# Patient Record
Sex: Male | Born: 1945 | Race: White | Hispanic: No | Marital: Married | Smoking: Former smoker
Health system: Southern US, Community
[De-identification: ages and names within clinical notes are randomized; demographics above are authoritative.]

## PROBLEM LIST (undated history)

## (undated) DIAGNOSIS — R140 Abdominal distension (gaseous): Secondary | ICD-10-CM

## (undated) DIAGNOSIS — N189 Chronic kidney disease, unspecified: Secondary | ICD-10-CM

## (undated) HISTORY — PX: COSMETIC SURGERY: SHX468

## (undated) HISTORY — PX: DENTAL SURGERY: SHX609

---

## 2002-05-03 ENCOUNTER — Ambulatory Visit (HOSPITAL_COMMUNITY): Admission: RE | Admit: 2002-05-03 | Discharge: 2002-05-03 | Payer: Self-pay | Admitting: Gastroenterology

## 2003-06-16 ENCOUNTER — Encounter: Admission: RE | Admit: 2003-06-16 | Discharge: 2003-06-16 | Payer: Self-pay | Admitting: Internal Medicine

## 2011-08-19 ENCOUNTER — Other Ambulatory Visit: Payer: Self-pay | Admitting: Internal Medicine

## 2011-08-19 DIAGNOSIS — K219 Gastro-esophageal reflux disease without esophagitis: Secondary | ICD-10-CM

## 2011-08-22 ENCOUNTER — Ambulatory Visit
Admission: RE | Admit: 2011-08-22 | Discharge: 2011-08-22 | Disposition: A | Discharge status: Home / Self Care | Payer: Medicare Other | Source: Ambulatory Visit | Attending: Internal Medicine | Admitting: Internal Medicine

## 2011-08-22 DIAGNOSIS — K219 Gastro-esophageal reflux disease without esophagitis: Secondary | ICD-10-CM

## 2012-02-12 DIAGNOSIS — H02409 Unspecified ptosis of unspecified eyelid: Secondary | ICD-10-CM | POA: Insufficient documentation

## 2012-02-12 DIAGNOSIS — H04123 Dry eye syndrome of bilateral lacrimal glands: Secondary | ICD-10-CM | POA: Insufficient documentation

## 2012-02-12 DIAGNOSIS — H01006 Unspecified blepharitis left eye, unspecified eyelid: Secondary | ICD-10-CM | POA: Insufficient documentation

## 2015-03-28 DIAGNOSIS — D696 Thrombocytopenia, unspecified: Secondary | ICD-10-CM | POA: Diagnosis not present

## 2015-03-28 DIAGNOSIS — Z1389 Encounter for screening for other disorder: Secondary | ICD-10-CM | POA: Diagnosis not present

## 2015-03-28 DIAGNOSIS — N400 Enlarged prostate without lower urinary tract symptoms: Secondary | ICD-10-CM | POA: Diagnosis not present

## 2015-03-28 DIAGNOSIS — R7309 Other abnormal glucose: Secondary | ICD-10-CM | POA: Diagnosis not present

## 2015-03-28 DIAGNOSIS — Z13220 Encounter for screening for lipoid disorders: Secondary | ICD-10-CM | POA: Diagnosis not present

## 2015-03-28 DIAGNOSIS — J309 Allergic rhinitis, unspecified: Secondary | ICD-10-CM | POA: Diagnosis not present

## 2015-03-28 DIAGNOSIS — Z136 Encounter for screening for cardiovascular disorders: Secondary | ICD-10-CM | POA: Diagnosis not present

## 2015-03-28 DIAGNOSIS — Z0000 Encounter for general adult medical examination without abnormal findings: Secondary | ICD-10-CM | POA: Diagnosis not present

## 2015-06-05 DIAGNOSIS — H1010 Acute atopic conjunctivitis, unspecified eye: Secondary | ICD-10-CM | POA: Diagnosis not present

## 2015-06-05 DIAGNOSIS — J019 Acute sinusitis, unspecified: Secondary | ICD-10-CM | POA: Diagnosis not present

## 2016-02-05 DIAGNOSIS — R140 Abdominal distension (gaseous): Secondary | ICD-10-CM

## 2016-02-05 HISTORY — DX: Abdominal distension (gaseous): R14.0

## 2016-02-25 DIAGNOSIS — R808 Other proteinuria: Secondary | ICD-10-CM | POA: Diagnosis not present

## 2016-02-25 DIAGNOSIS — R1907 Generalized intra-abdominal and pelvic swelling, mass and lump: Secondary | ICD-10-CM | POA: Diagnosis not present

## 2016-02-25 DIAGNOSIS — R198 Other specified symptoms and signs involving the digestive system and abdomen: Secondary | ICD-10-CM | POA: Diagnosis not present

## 2016-02-26 ENCOUNTER — Encounter (HOSPITAL_COMMUNITY): Payer: Self-pay | Admitting: *Deleted

## 2016-02-26 ENCOUNTER — Emergency Department (HOSPITAL_COMMUNITY): Payer: PPO

## 2016-02-26 ENCOUNTER — Inpatient Hospital Stay (HOSPITAL_COMMUNITY)
Admission: EM | Admit: 2016-02-26 | Discharge: 2016-03-01 | DRG: 436 | Disposition: A | Discharge status: Home / Self Care | Payer: PPO | Attending: Internal Medicine | Admitting: Internal Medicine

## 2016-02-26 ENCOUNTER — Inpatient Hospital Stay (HOSPITAL_COMMUNITY): Payer: PPO

## 2016-02-26 DIAGNOSIS — C228 Malignant neoplasm of liver, primary, unspecified as to type: Secondary | ICD-10-CM | POA: Diagnosis not present

## 2016-02-26 DIAGNOSIS — R300 Dysuria: Secondary | ICD-10-CM | POA: Diagnosis present

## 2016-02-26 DIAGNOSIS — R740 Nonspecific elevation of levels of transaminase and lactic acid dehydrogenase [LDH]: Secondary | ICD-10-CM | POA: Diagnosis not present

## 2016-02-26 DIAGNOSIS — K449 Diaphragmatic hernia without obstruction or gangrene: Secondary | ICD-10-CM | POA: Diagnosis not present

## 2016-02-26 DIAGNOSIS — K839 Disease of biliary tract, unspecified: Secondary | ICD-10-CM

## 2016-02-26 DIAGNOSIS — K831 Obstruction of bile duct: Secondary | ICD-10-CM | POA: Diagnosis not present

## 2016-02-26 DIAGNOSIS — K8040 Calculus of bile duct with cholecystitis, unspecified, without obstruction: Secondary | ICD-10-CM | POA: Diagnosis not present

## 2016-02-26 DIAGNOSIS — R188 Other ascites: Secondary | ICD-10-CM

## 2016-02-26 DIAGNOSIS — C786 Secondary malignant neoplasm of retroperitoneum and peritoneum: Secondary | ICD-10-CM | POA: Diagnosis not present

## 2016-02-26 DIAGNOSIS — K759 Inflammatory liver disease, unspecified: Secondary | ICD-10-CM | POA: Diagnosis present

## 2016-02-26 DIAGNOSIS — R109 Unspecified abdominal pain: Secondary | ICD-10-CM | POA: Diagnosis present

## 2016-02-26 DIAGNOSIS — R7401 Elevation of levels of liver transaminase levels: Secondary | ICD-10-CM | POA: Diagnosis present

## 2016-02-26 DIAGNOSIS — J9811 Atelectasis: Secondary | ICD-10-CM | POA: Diagnosis not present

## 2016-02-26 DIAGNOSIS — C221 Intrahepatic bile duct carcinoma: Secondary | ICD-10-CM | POA: Diagnosis not present

## 2016-02-26 DIAGNOSIS — R180 Malignant ascites: Secondary | ICD-10-CM | POA: Diagnosis not present

## 2016-02-26 DIAGNOSIS — C180 Malignant neoplasm of cecum: Secondary | ICD-10-CM | POA: Diagnosis not present

## 2016-02-26 DIAGNOSIS — R935 Abnormal findings on diagnostic imaging of other abdominal regions, including retroperitoneum: Secondary | ICD-10-CM | POA: Diagnosis not present

## 2016-02-26 DIAGNOSIS — C801 Malignant (primary) neoplasm, unspecified: Secondary | ICD-10-CM | POA: Diagnosis not present

## 2016-02-26 DIAGNOSIS — R1084 Generalized abdominal pain: Secondary | ICD-10-CM

## 2016-02-26 DIAGNOSIS — C482 Malignant neoplasm of peritoneum, unspecified: Secondary | ICD-10-CM | POA: Diagnosis not present

## 2016-02-26 DIAGNOSIS — R1011 Right upper quadrant pain: Secondary | ICD-10-CM | POA: Diagnosis not present

## 2016-02-26 DIAGNOSIS — R8569 Abnormal cytological findings in specimens from other digestive organs and abdominal cavity: Secondary | ICD-10-CM | POA: Diagnosis not present

## 2016-02-26 DIAGNOSIS — Z87891 Personal history of nicotine dependence: Secondary | ICD-10-CM | POA: Diagnosis not present

## 2016-02-26 DIAGNOSIS — R140 Abdominal distension (gaseous): Secondary | ICD-10-CM

## 2016-02-26 DIAGNOSIS — K8050 Calculus of bile duct without cholangitis or cholecystitis without obstruction: Secondary | ICD-10-CM

## 2016-02-26 DIAGNOSIS — R933 Abnormal findings on diagnostic imaging of other parts of digestive tract: Secondary | ICD-10-CM | POA: Diagnosis not present

## 2016-02-26 DIAGNOSIS — K5900 Constipation, unspecified: Secondary | ICD-10-CM | POA: Diagnosis present

## 2016-02-26 DIAGNOSIS — C240 Malignant neoplasm of extrahepatic bile duct: Secondary | ICD-10-CM | POA: Diagnosis present

## 2016-02-26 HISTORY — DX: Abdominal distension (gaseous): R14.0

## 2016-02-26 LAB — CBC WITH DIFFERENTIAL/PLATELET
BASOS PCT: 0 %
Basophils Absolute: 0 10*3/uL (ref 0.0–0.1)
EOS ABS: 0.1 10*3/uL (ref 0.0–0.7)
EOS PCT: 2 %
HCT: 41.6 % (ref 39.0–52.0)
HEMOGLOBIN: 14.2 g/dL (ref 13.0–17.0)
Lymphocytes Relative: 25 %
Lymphs Abs: 1.7 10*3/uL (ref 0.7–4.0)
MCH: 34.2 pg — ABNORMAL HIGH (ref 26.0–34.0)
MCHC: 34.1 g/dL (ref 30.0–36.0)
MCV: 100.2 fL — ABNORMAL HIGH (ref 78.0–100.0)
Monocytes Absolute: 0.8 10*3/uL (ref 0.1–1.0)
Monocytes Relative: 13 %
NEUTROS PCT: 60 %
Neutro Abs: 4 10*3/uL (ref 1.7–7.7)
PLATELETS: 242 10*3/uL (ref 150–400)
RBC: 4.15 MIL/uL — AB (ref 4.22–5.81)
RDW: 14.1 % (ref 11.5–15.5)
WBC: 6.7 10*3/uL (ref 4.0–10.5)

## 2016-02-26 LAB — COMPREHENSIVE METABOLIC PANEL
ALBUMIN: 3 g/dL — AB (ref 3.5–5.0)
ALK PHOS: 327 U/L — AB (ref 38–126)
ALT: 184 U/L — AB (ref 17–63)
ANION GAP: 9 (ref 5–15)
AST: 139 U/L — ABNORMAL HIGH (ref 15–41)
BUN: 11 mg/dL (ref 6–20)
CALCIUM: 8.5 mg/dL — AB (ref 8.9–10.3)
CO2: 27 mmol/L (ref 22–32)
CREATININE: 0.99 mg/dL (ref 0.61–1.24)
Chloride: 99 mmol/L — ABNORMAL LOW (ref 101–111)
GFR calc non Af Amer: >60 mL/min (ref >60)
GLUCOSE: 109 mg/dL — AB (ref 65–99)
Potassium: 4.1 mmol/L (ref 3.5–5.1)
SODIUM: 135 mmol/L (ref 135–145)
Total Bilirubin: 1.9 mg/dL — ABNORMAL HIGH (ref 0.3–1.2)
Total Protein: 6.5 g/dL (ref 6.5–8.1)

## 2016-02-26 LAB — URINALYSIS, ROUTINE W REFLEX MICROSCOPIC
BILIRUBIN URINE: NEGATIVE
Glucose, UA: NEGATIVE mg/dL
Hgb urine dipstick: NEGATIVE
KETONES UR: NEGATIVE mg/dL
Leukocytes, UA: NEGATIVE
NITRITE: NEGATIVE
PH: 5 (ref 5.0–8.0)
Protein, ur: NEGATIVE mg/dL
Specific Gravity, Urine: 1.021 (ref 1.005–1.030)

## 2016-02-26 LAB — LIPASE, BLOOD: Lipase: 21 U/L (ref 11–51)

## 2016-02-26 LAB — PROTIME-INR
INR: 1.11
PROTHROMBIN TIME: 14.3 s (ref 11.4–15.2)

## 2016-02-26 LAB — APTT: aPTT: 29 seconds (ref 24–36)

## 2016-02-26 MED ORDER — ONDANSETRON HCL 4 MG/2ML IJ SOLN
4.0000 mg | Freq: Four times a day (QID) | INTRAMUSCULAR | Status: DC | PRN
  Administered 2016-02-27: 4 mg via INTRAVENOUS
  Filled 2016-02-26: qty 2

## 2016-02-26 MED ORDER — IOPAMIDOL (ISOVUE-300) INJECTION 61%
INTRAVENOUS | Status: AC
  Administered 2016-02-26: 100 mL
  Filled 2016-02-26: qty 100

## 2016-02-26 MED ORDER — PIPERACILLIN-TAZOBACTAM 3.375 G IVPB 30 MIN
3.3750 g | Freq: Once | INTRAVENOUS | Status: AC
  Administered 2016-02-27: 3.375 g via INTRAVENOUS
  Filled 2016-02-26: qty 50

## 2016-02-26 MED ORDER — PIPERACILLIN-TAZOBACTAM 3.375 G IVPB
3.3750 g | Freq: Three times a day (TID) | INTRAVENOUS | Status: DC
  Administered 2016-02-27 – 2016-02-29 (×7): 3.375 g via INTRAVENOUS
  Filled 2016-02-26 (×8): qty 50

## 2016-02-26 MED ORDER — OXYCODONE HCL 5 MG PO TABS: 5.0000 mg | ORAL_TABLET | Freq: Four times a day (QID) | ORAL | 0 refills | Status: DC | PRN

## 2016-02-26 MED ORDER — ONDANSETRON HCL 4 MG PO TABS: 4.0000 mg | ORAL_TABLET | Freq: Four times a day (QID) | ORAL | Status: DC | PRN

## 2016-02-26 MED ORDER — FENTANYL CITRATE (PF) 100 MCG/2ML IJ SOLN
50.0000 ug | Freq: Once | INTRAMUSCULAR | Status: AC
  Administered 2016-02-26: 50 ug via INTRAVENOUS
  Filled 2016-02-26: qty 2

## 2016-02-26 MED ORDER — ENOXAPARIN SODIUM 40 MG/0.4ML ~~LOC~~ SOLN
40.0000 mg | Freq: Every day | SUBCUTANEOUS | Status: DC
  Administered 2016-02-27: 40 mg via SUBCUTANEOUS
  Filled 2016-02-26 (×3): qty 0.4

## 2016-02-26 MED ORDER — FENTANYL CITRATE (PF) 100 MCG/2ML IJ SOLN
25.0000 ug | INTRAMUSCULAR | Status: DC | PRN
Start: 2016-02-26 — End: 2016-03-01
  Administered 2016-02-27 – 2016-03-01 (×6): 50 ug via INTRAVENOUS
  Filled 2016-02-26 (×7): qty 2

## 2016-02-26 NOTE — ED Notes (Signed)
Patient didn't want labs drawn states they were drawn yest.

## 2016-02-26 NOTE — ED Provider Notes (Signed)
[facility] DEPT Provider Note   CSN: [id number] Arrival date & time: 02/26/16  [id number]     History   Chief Complaint Chief Complaint  Patient presents with  . Abdominal Pain    HPI Matthew Waller is a 71 y.o. male.  The history is provided by the patient and the spouse.  Abdominal Pain   This is a new problem. Episode onset: 2 weeks ago. The problem occurs constantly. The problem has been gradually worsening. The pain is associated with an unknown factor. The pain is located in the generalized abdominal region and RUQ. The quality of the pain is aching. The pain is moderate. Associated symptoms include diarrhea (for 5 days which has since resolved). Pertinent negatives include fever and nausea. The symptoms are aggravated by certain positions. Nothing relieves the symptoms.    History reviewed. No pertinent past medical history.  There are no active problems to display for this patient.   History reviewed. No pertinent surgical history.     Home Medications    Prior to Admission medications   Medication Sig Start Date End Date Taking? Authorizing Provider  Carboxymethylcell-Hypromellose (GENTEAL OP) Place 1 drop into both eyes daily.   Yes Historical Provider, MD  Carboxymethylcell-Hypromellose (GENTEAL) 0.25-0.3 % GEL Place 1 drop into both eyes at bedtime.   Yes Historical Provider, MD  Ginger, Zingiber officinalis, (GINGER ROOT) 550 MG CAPS Take 550 mg by mouth daily.   Yes Historical Provider, MD  GLUCOSAMINE-CHONDROITIN PO Take 2 capsules by mouth daily.   Yes Historical Provider, MD  ibuprofen (ADVIL,MOTRIN) 200 MG tablet Take 400 mg by mouth every 6 (six) hours as needed (pain).   Yes Historical Provider, MD  loratadine (CLARITIN) 10 MG tablet Take 10 mg by mouth daily.   Yes Historical Provider, MD  Omega-3 Fatty Acids (FISH OIL) 1000 MG CAPS Take 1,000 mg by mouth daily.   Yes Historical Provider, MD  OVER THE COUNTER MEDICATION Take 1 capsule by mouth daily.  Prostate Health   Yes Historical Provider, MD    Family History History reviewed. No pertinent family history.  Social History Social History  Substance Use Topics  . Smoking status: Former Research scientist (life sciences)  . Smokeless tobacco: Never Used  . Alcohol use Yes     Allergies   Patient has no known allergies.   Review of Systems Review of Systems  Constitutional: Negative for fever.  Gastrointestinal: Positive for abdominal pain and diarrhea (for 5 days which has since resolved). Negative for nausea.  All other systems reviewed and are negative.    Physical Exam Updated Vital Signs BP 143/81   Pulse 76   Temp 98.9 F (37.2 C) (Oral)   Resp 18   Ht 6\' 3"  (1.905 m)   Wt 230 lb (104.3 kg)   SpO2 96%   BMI 28.75 kg/m   Physical Exam  Constitutional: He is oriented to person, place, and time. He appears well-developed and well-nourished. No distress.  HENT:  Head: Normocephalic and atraumatic.  Nose: Nose normal.  Eyes: Conjunctivae are normal.  Neck: Neck supple. No tracheal deviation present.  Cardiovascular: Normal rate, regular rhythm and normal heart sounds.   Pulmonary/Chest: Effort normal and breath sounds normal. No respiratory distress.  Abdominal: Soft. He exhibits distension and ascites (with dullness). There is no tenderness. There is no rigidity, no rebound, no guarding, no tenderness at McBurney's point and negative Murphy's sign.  Neurological: He is alert and oriented to person, place, and time.  Skin: Skin is  warm and dry.  Psychiatric: He has a normal mood and affect.  Vitals reviewed.    ED Treatments / Results  Labs (all labs ordered are listed, but only abnormal results are displayed) Labs Reviewed  CBC WITH DIFFERENTIAL/PLATELET - Abnormal; Notable for the following:       Result Value   RBC 4.15 (*)    MCV 100.2 (*)    MCH 34.2 (*)    All other components within normal limits  COMPREHENSIVE METABOLIC PANEL - Abnormal; Notable for the following:      Chloride 99 (*)    Glucose, Bld 109 (*)    Calcium 8.5 (*)    Albumin 3.0 (*)    AST 139 (*)    ALT 184 (*)    Alkaline Phosphatase 327 (*)    Total Bilirubin 1.9 (*)    All other components within normal limits  URINALYSIS, ROUTINE W REFLEX MICROSCOPIC - Abnormal; Notable for the following:    Color, Urine AMBER (*)    All other components within normal limits  LIPASE, BLOOD  PROTIME-INR  APTT  HEPATITIS PANEL, ACUTE  EPSTEIN-BARR VIRUS VCA ANTIBODY PANEL    EKG  EKG Interpretation None       Radiology Dg Chest Port 1 View  Result Date: 02/26/2016 CLINICAL DATA:  Right middle flank pain. Abdominal distension with nausea, no vomiting. EXAM: PORTABLE CHEST 1 VIEW COMPARISON:  None. FINDINGS: 1514 hours. The heart size and mediastinal contours are normal. There is mild left-greater-than-right basilar atelectasis. No edema, confluent airspace opacity or significant pleural effusion. The bones appear unremarkable. IMPRESSION: Mild bibasilar atelectasis.  No acute cardiopulmonary process. Electronically Signed   By: Richardean Sale M.D.   On: 02/26/2016 15:29    Procedures Procedures (including critical care time)  Emergency Focused Ultrasound Exam Limited Abdominal Ultrasound for Evaluation of Free Fluid  Performed and interpreted by Dr. Laneta Simmers Indication: abdominal pain Multiple views of the abdomen are obtained with a multi frequency probe.  Findings: Free fluid is present. The fluid is simple. Interpretation: evidence of free fluid consistent with ascites CPT Code: [phone]  Medications Ordered in ED Medications  iopamidol (ISOVUE-300) 61 % injection (100 mLs  Contrast Given 02/26/16 1554)     Initial Impression / Assessment and Plan / ED Course  I have reviewed the triage vital signs and the nursing notes.  Pertinent labs & imaging results that were available during my care of the patient were reviewed by me and considered in my medical decision making (see chart  for details).     71 year old male presents with ongoing abdominal distention and right upper quadrant abdominal pain that has occurred over the last 2 weeks. He has had progressive increase in distention of his abdomen. The symptoms were preceded by 5 days of watery diarrhea without blood. The appearance of his abdomen is concerning for development of cirrhosis and ascites likely secondary to acute hepatitis given the historical features. He has transaminitis noted on the outside lab work with normal bilirubin which will be repeated today.  His new labs show bilirubin elevation to 1.9 which is up from yesterday and stable transaminitis. Hepatitis panel and Epstein-Barr panel were drawn for follow-up. Plan will be for CT of the abdomen and pelvis to further evaluate for structural etiologies and rule out metastatic disease or other parenchymal infiltrate causing liver dysfunction. Chest x-ray is without signs of fluid accumulation or infiltrates.  I discussed the case with Dr. Collene Mares of gastroenterology who requested another call  after imaging was completed to make recommendations for follow-up.  Care transferred to Dr. Laverta Baltimore at 4:30 PM pending results of imaging.   Final Clinical Impressions(s) / ED Diagnoses   Final diagnoses:  Other ascites  Hepatitis    New Prescriptions New Prescriptions   OXYCODONE (ROXICODONE) 5 MG IMMEDIATE RELEASE TABLET    Take 1 tablet (5 mg total) by mouth every 6 (six) hours as needed for severe pain.     Leo Grosser, MD 02/26/16 [phone]

## 2016-02-26 NOTE — H&P (Signed)
History and Physical    Matthew Waller [id number] DOB: October 10, 1945 DOA: 02/26/2016  Referring MD/NP/PA: Dr Laverta Baltimore   PCP: [name], MD   Patient coming from: Home   Chief Complaint: abd pain   HPI:  71 yo male with no significant medical problems and who considers himself rather healthy for his age, presented to [facility] ED for evaluation of progressive abd discomfort. Pt explains that about 2 weeks prior to this admission he started having multiple episodes of diarrhea and has not been eating much. Since that time he noted that his urine was rather dark and associated with intermittent dysuria. Symptoms progressed and he started to experience intermittent episodes of throbbing and at time sharp RUQ abd pain, 7/10 in severity when present, self resolving, non radiating and occasionally but not consistently associated with nausea and progressively worsening abd distension. Pt denies similar events in the past, no changes in weight. Pt also denies fevers, chills, no recent traveling outside the country, no known sick contacts or exposures, no chest pain or shortness of breath.   ED Course: Pt is hemodynamically stable, physical exam below but mostly notable for distended abd. VS and blood work stable.CT abd abd pelvis  Notable for mild intrahepatic and extrahepatic biliary dilatation and concerning for obstruction, possible hepatic cirrhosis moderate ascites, irregular thickening involving the omentum, ? inflammation or edema, or possibly carcinomatosis. Gi team consulted and will see pt. TRH asked to admit to medical bed for further evaluation.   Review of Systems:  Constitutional: Negative for fever, chills, fatigue.  HENT: Negative for ear pain, nosebleeds, congestion, facial swelling, rhinorrhea, neck pain, neck stiffness and ear discharge.   Eyes: Negative for pain, discharge, redness, itching and visual disturbance.  Respiratory: Negative for cough, choking, chest tightness, shortness of  breath, wheezing and stridor.   Cardiovascular: Negative for chest pain, palpitations and leg swelling.  Gastrointestinal: Positive for abdominal distention.  Genitourinary: Negative for flank pain, decreased urine volume, dyspareunia.  Musculoskeletal: Negative for back pain, joint swelling, arthralgias and gait problem.  Neurological: Negative for dizziness, tremors, seizures, syncope, facial asymmetry, speech difficulty, weakness, light-headedness, numbness and headaches.  Hematological: Negative for adenopathy. Does not bruise/bleed easily.  Psychiatric/Behavioral: Negative for hallucinations, behavioral problems, confusion, dysphoric mood, decreased concentration and agitation.   Pt denies past medical hx of cancers, HTN, HLD  Social Hx:  reports that he has quit smoking. He has never used smokeless tobacco. He reports that he drinks alcohol. He reports that he does not use drugs.  No Known Allergies  Pt denies family history of cancer of HTN  Prior to Admission medications   Medication Sig Start Date End Date Taking? Authorizing Provider  ibuprofen (ADVIL,MOTRIN) 200 MG tablet Take 400 mg by mouth every 6 (six) hours as needed (pain).   Yes Historical Provider, MD  Omega-3 Fatty Acids (FISH OIL) 1000 MG CAPS Take 1,000 mg by mouth daily.   Yes Historical Provider, MD  oxyCODONE (ROXICODONE) 5 MG immediate release tablet Take 1 tablet (5 mg total) by mouth every 6 (six) hours as needed for severe pain. 02/26/16   Leo Grosser, MD   Physical Exam: Vitals:   02/26/16 1430 02/26/16 1445 02/26/16 1500 02/26/16 1530  BP: 145/77 132/68 122/78 143/97  Pulse: 75 70 71 72  Resp:      Temp:      TempSrc:      SpO2: 99% 96% 97% 96%  Weight:      Height:  Constitutional: NAD, calm, comfortable Vitals:   02/26/16 1430 02/26/16 1445 02/26/16 1500 02/26/16 1530  BP: 145/77 132/68 122/78 143/97  Pulse: 75 70 71 72  Resp:      Temp:      TempSrc:      SpO2: 99% 96% 97% 96%    Weight:      Height:       Eyes: PERRL, lids and conjunctivae normal ENMT: Mucous membranes are moist. Posterior pharynx clear of any exudate or lesions.Normal dentition.  Neck: normal, supple, no masses, no thyromegaly Respiratory: clear to auscultation bilaterally, no wheezing, no crackles. Normal respiratory effort. No accessory muscle use.  Cardiovascular: Regular rate and rhythm, no murmurs / rubs / gallops. No extremity edema. 2+ pedal pulses. No carotid bruits.  Abdomen: distended with tympanic sounds  Musculoskeletal: no clubbing / cyanosis. No joint deformity upper and lower extremities. Good ROM, no contractures.  Skin: no rashes, lesions, ulcers. No induration Neurologic: CN 2-12 grossly intact. Sensation intact, DTR normal. Strength 5/5 in all 4.  Psychiatric: Normal judgment and insight. Alert and oriented x 3. Normal mood.   Labs on Admission: I have personally reviewed following labs and imaging studies  CBC:  Recent Labs Lab 02/26/16 1424  WBC 6.7  NEUTROABS 4.0  HGB 14.2  HCT 41.6  MCV 100.2*  PLT [id number]   Basic Metabolic Panel:  Recent Labs Lab 02/26/16 1424  NA 135  K 4.1  CL 99*  CO2 27  GLUCOSE 109*  BUN 11  CREATININE 0.99  CALCIUM 8.5*   Liver Function Tests:  Recent Labs Lab 02/26/16 1424  AST 139*  ALT 184*  ALKPHOS 327*  BILITOT 1.9*  PROT 6.5  ALBUMIN 3.0*    Recent Labs Lab 02/26/16 1424  LIPASE 21   Coagulation Profile:  Recent Labs Lab 02/26/16 1424  INR 1.11   Urine analysis:    Component Value Date/Time   COLORURINE AMBER (A) 02/26/2016 [place] 02/26/2016 1437   LABSPEC 1.021 02/26/2016 1437   PHURINE 5.0 02/26/2016 1437   GLUCOSEU NEGATIVE 02/26/2016 1437   HGBUR NEGATIVE 02/26/2016 1437   BILIRUBINUR NEGATIVE 02/26/2016 1437   KETONESUR NEGATIVE 02/26/2016 1437   PROTEINUR NEGATIVE 02/26/2016 1437   NITRITE NEGATIVE 02/26/2016 1437   LEUKOCYTESUR NEGATIVE 02/26/2016 1437   Radiological  Exams on Admission: Ct Abdomen Pelvis W Contrast Mild intrahepatic and extrahepatic biliary dilatation is noted. Correlation with liver function tests is recommended to rule out obstruction. Findings are suggestive of possible hepatic cirrhosis. Moderate ascites is noted. Irregular thickening is seen involving the omentum ; it is uncertain if this represents inflammation or edema, or possibly carcinomatosis.   Dg Chest Port 1 View Mild bibasilar atelectasis.  No acute cardiopulmonary process.   EKG: pending   Assessment/Plan  Principal Problem:   Abdominal distension, RUQ pain  - with moderate ascites on CT scan and unclear etiology - RUQ [place] pending - IR consulted for paracentesis and will obtain fluid analysis as well  - placed on Zosyn for until an infectious etiology ruled out  - obtain CMET in AM  Active Problems:   Transaminitis - likely related to above - CMET in AM - hep panel pending     Dysuria - urinalysis and urine culture pending   DVT prophylaxis: Lovenox SQ Code Status: Full  Family Communication: Pt and wife updated at bedside Disposition Plan: admit to med unit  Consults called: GI Dr. Collene Mares  Admission status: Inpatient   MAGICK-Duffy Dantonio,  Malavika Lira MD [facility] Pager [phone]  If 7PM-7AM, please contact night-coverage [email or web address] Password [facility]  02/26/2016, 5:01 PM

## 2016-02-26 NOTE — ED Triage Notes (Signed)
States he went to [facility] yest with abd.pain and had blood work drawn , states he saw his PCP today and was seen to ED for further eval. States his abd. Is distended. C/o nausea no vomiting. No current diarrhea. Staes 2 weeks ago had a 5 day episode of diarrhea

## 2016-02-26 NOTE — ED Provider Notes (Signed)
Blood pressure 143/97, pulse 72, temperature 98.9 F (37.2 C), temperature source Oral, resp. rate 18, height 6\' 3"  (1.905 m), weight 230 lb (104.3 kg), SpO2 96 %.  Assuming care from Dr. Laneta Simmers.  In short, Matthew Waller is a 71 y.o. male with a chief complaint of Abdominal Pain .  Refer to the original H&P for additional details.  The current plan of care is to follow CT.  04:48 PM Spoke with Dr. Collene Mares with GI regarding the CT. Will order [place] to evaluate for obstructing stone but would not expect omental findings with stone obstruction. She recommends admission to the hospitalist and they will consult.   05:04 PM Spoke with hospitalist who will place orders for admission. Reviewed imaging and labs. Updated patient and family regarding plan at bedside.   Nanda Quinton, MD   Margette Fast, MD 02/26/16 [phone]

## 2016-02-26 NOTE — Progress Notes (Signed)
Pharmacy Antibiotic Note  Matthew Waller is a 71 y.o. male admitted on 02/26/2016 with intra-abdominal infection.  Pharmacy has been consulted for zosyn dosing. Pt is afebrile and WBC is WNL. Scr is also WNL.   Plan: Zosyn 3.375gm IV Q8H (4 hr inf) F/u renal fxn, C&S, clinical status *Pharmacy will sign-off as no dose adjustments are anticipated. Thank you for the consult!  Height: 6\' 3"  (190.5 cm) Weight: 230 lb (104.3 kg) IBW/kg (Calculated) : 84.5  Temp (24hrs), Avg:98.9 F (37.2 C), Min:98.9 F (37.2 C), Max:98.9 F (37.2 C)   Recent Labs Lab 02/26/16 1424  WBC 6.7  CREATININE 0.99    Estimated Creatinine Clearance: 86.9 mL/min (by C-G formula based on SCr of 0.99 mg/dL).    No Known Allergies  Antimicrobials this admission: Zosyn 1/22>>  Dose adjustments this admission: N/A  Microbiology results: Pending  Thank you for allowing pharmacy to be a part of this patient's care.  Trennon Torbeck, Rande Lawman 02/26/2016 8:10 PM

## 2016-02-26 NOTE — ED Notes (Signed)
Pt returned from [place]

## 2016-02-27 ENCOUNTER — Inpatient Hospital Stay (HOSPITAL_COMMUNITY): Payer: PPO

## 2016-02-27 ENCOUNTER — Encounter (HOSPITAL_COMMUNITY): Payer: Self-pay | Admitting: General Practice

## 2016-02-27 DIAGNOSIS — R188 Other ascites: Secondary | ICD-10-CM

## 2016-02-27 DIAGNOSIS — R740 Nonspecific elevation of levels of transaminase and lactic acid dehydrogenase [LDH]: Secondary | ICD-10-CM

## 2016-02-27 DIAGNOSIS — R140 Abdominal distension (gaseous): Secondary | ICD-10-CM

## 2016-02-27 LAB — BODY FLUID CELL COUNT WITH DIFFERENTIAL
EOS FL: 0 %
LYMPHS FL: 59 %
MONOCYTE-MACROPHAGE-SEROUS FLUID: 37 % — AB (ref 50–90)
Neutrophil Count, Fluid: 4 % (ref 0–25)
Other Cells, Fluid: REACTIVE %
Total Nucleated Cell Count, Fluid: 1102 cu mm — ABNORMAL HIGH (ref 0–1000)

## 2016-02-27 LAB — HEPATITIS PANEL, ACUTE
HEP B S AG: NEGATIVE
Hep A IgM: NEGATIVE
Hep B C IgM: NEGATIVE

## 2016-02-27 LAB — URINALYSIS, ROUTINE W REFLEX MICROSCOPIC
BILIRUBIN URINE: NEGATIVE
Glucose, UA: NEGATIVE mg/dL
Hgb urine dipstick: NEGATIVE
KETONES UR: 5 mg/dL — AB
LEUKOCYTES UA: NEGATIVE
NITRITE: NEGATIVE
PROTEIN: NEGATIVE mg/dL
Specific Gravity, Urine: 1.041 — ABNORMAL HIGH (ref 1.005–1.030)
pH: 5 (ref 5.0–8.0)

## 2016-02-27 LAB — CBG MONITORING, ED
Glucose-Capillary: 103 mg/dL — ABNORMAL HIGH (ref 65–99)
Glucose-Capillary: 93 mg/dL (ref 65–99)

## 2016-02-27 LAB — CBC
HEMATOCRIT: 41.3 % (ref 39.0–52.0)
Hemoglobin: 14 g/dL (ref 13.0–17.0)
MCH: 33.8 pg (ref 26.0–34.0)
MCHC: 33.9 g/dL (ref 30.0–36.0)
MCV: 99.8 fL (ref 78.0–100.0)
Platelets: 270 10*3/uL (ref 150–400)
RBC: 4.14 MIL/uL — ABNORMAL LOW (ref 4.22–5.81)
RDW: 14 % (ref 11.5–15.5)
WBC: 6.8 10*3/uL (ref 4.0–10.5)

## 2016-02-27 LAB — ALBUMIN, FLUID (OTHER): ALBUMIN FL: 2.6 g/dL

## 2016-02-27 LAB — COMPREHENSIVE METABOLIC PANEL
ALBUMIN: 2.9 g/dL — AB (ref 3.5–5.0)
ALT: 176 U/L — ABNORMAL HIGH (ref 17–63)
ANION GAP: 8 (ref 5–15)
AST: 136 U/L — AB (ref 15–41)
Alkaline Phosphatase: 290 U/L — ABNORMAL HIGH (ref 38–126)
BUN: 13 mg/dL (ref 6–20)
CHLORIDE: 101 mmol/L (ref 101–111)
CO2: 24 mmol/L (ref 22–32)
Calcium: 8.5 mg/dL — ABNORMAL LOW (ref 8.9–10.3)
Creatinine, Ser: 1.02 mg/dL (ref 0.61–1.24)
GFR calc Af Amer: >60 mL/min (ref >60)
GLUCOSE: 107 mg/dL — AB (ref 65–99)
POTASSIUM: 4.2 mmol/L (ref 3.5–5.1)
Sodium: 133 mmol/L — ABNORMAL LOW (ref 135–145)
TOTAL PROTEIN: 6.2 g/dL — AB (ref 6.5–8.1)
Total Bilirubin: 1.8 mg/dL — ABNORMAL HIGH (ref 0.3–1.2)

## 2016-02-27 LAB — LACTATE DEHYDROGENASE: LDH: 209 U/L — AB (ref 98–192)

## 2016-02-27 LAB — GLUCOSE, SEROUS FLUID: GLUCOSE FL: 88 mg/dL

## 2016-02-27 LAB — GRAM STAIN

## 2016-02-27 LAB — PSA: PSA: 0.57 ng/mL (ref 0.00–4.00)

## 2016-02-27 LAB — PROTEIN, BODY FLUID: TOTAL PROTEIN, FLUID: 4.4 g/dL

## 2016-02-27 LAB — LACTATE DEHYDROGENASE, PLEURAL OR PERITONEAL FLUID: LD FL: 445 U/L — AB (ref 3–23)

## 2016-02-27 MED ORDER — LIDOCAINE HCL 1 % IJ SOLN
INTRAMUSCULAR | Status: AC
  Filled 2016-02-27: qty 20

## 2016-02-27 NOTE — ED Notes (Signed)
Pt transported to [facility]

## 2016-02-27 NOTE — ED Notes (Signed)
Pt returned from [place]. Tolerated procedure well. Pt alert and oriented. States he feels much better.

## 2016-02-27 NOTE — ED Notes (Signed)
Called [place] to find out when pt is on schedule for paracentesis. [place] tech states they will be coming for him in the next 30 minutes. Will inform pt

## 2016-02-27 NOTE — ED Notes (Signed)
Ordered full liquid lunch tray for pt

## 2016-02-27 NOTE — ED Notes (Signed)
Patient CBG was 93. 

## 2016-02-27 NOTE — Progress Notes (Signed)
PROGRESS NOTE                                                                                                                                                                                                             Patient Demographics:    Matthew Waller, is a 71 y.o. male, DOB - 1945-06-20, [id number]  Admit date - 02/26/2016   Admitting Physician Theodis Blaze, MD  Outpatient Primary MD for the patient is [name], MD  LOS - 1  Outpatient Specialists: NONE  Chief Complaint  Patient presents with  . Abdominal Pain       Brief Narrative  71 year old male with a past medical history presented to the ED with progressive abdominal discomfort for 2 weeks duration. He had multiple episodes of diarrhea and poor by mouth intake. The diarrhea lasts for 5 days and subsided. He then started having sharp right upper quadrant pain, nonradiating and associated with nausea but no vomiting. He noticed progressive abdominal distention. No prior symptoms in the past. Reports very occasional alcohol use (1-2 times a month), denies recent local or distant travel, sick contact, family history of liver disease, IV drug use or being on any prescription medications or over-the-counter supplements. He denies any weight loss. Did report that his urine was dark. Denied any fevers or chills, chest pain, palpitations, shortness of breath, weakness or numbness in his extremities.  In the ED vitals were stable. Physical exam showed distended abdomen. Labs showed transaminitis and. CT of the abdomen and pelvis showed mild intrahepatic and extremity biliary dilatation concerning for obstruction with possible hepatic cirrhosis and moderate ascites. Also irregular thickening of the omentum with possible inflammation versus edema versus carcinomatosis.  Admitted to hospitalist service and GI consulted.      Subjective:   Patient complains of  abdominal distention and right upper quadrant pain. He diagnostic and therapeutic paracentesis with about 3.7 L  clear ascites fluid. Reported abdominal fullness shortly after being placed back on her diet.   Assessment  & Plan :    Principal Problem:   Abdominal distension With transaminitis Differential includes liver cirrhosis with ascites, distal CBD obstruction versus carcinomatosis. Ascites fluid with high WBC (1100), with reactive mesothelial cells and high fluid LDH... On empiric Zosyn that should cover for SBP. Continue when necessary fentanyl for pain. Complained of abdominal fullness  shortly after placing on full liquid. Hepatitis panel negative. Check AFP, CEA  and CA 19-9. Follow ascites fluid culture and cytology. Dr Benson Norway to see pt.     1.    Code Status : Full code  Family Communication  :None at bedside  Disposition Plan  : Pending hospital course  Barriers For Discharge : Active symptoms  Consults  :  GI (Dr. Benson Norway)   Procedures  : CT abdomen pelvis Ultrasound abdomen Abdominal Paracentesis  DVT Prophylaxis  :  Lovenox -   Lab Results  Component Value Date   PLT 270 02/27/2016    Antibiotics  :   Anti-infectives    Start     Dose/Rate Route Frequency Ordered Stop   02/27/16 0300  piperacillin-tazobactam (ZOSYN) IVPB 3.375 g     3.375 g 12.5 mL/hr over 240 Minutes Intravenous Every 8 hours 02/26/16 2010     02/26/16 2015  piperacillin-tazobactam (ZOSYN) IVPB 3.375 g     3.375 g 100 mL/hr over 30 Minutes Intravenous  Once 02/26/16 2010 02/27/16 0131        Objective:   Vitals:   02/27/16 0945 02/27/16 1016 02/27/16 1022 02/27/16 1055  BP: 139/92 141/84 149/85 114/68  Pulse:    71  Resp:    15  Temp:    97.6 F (36.4 C)  TempSrc:    Oral  SpO2:    100%  Weight:      Height:        Wt Readings from Last 3 Encounters:  02/26/16 104.3 kg (230 lb)     Intake/Output Summary (Last 24 hours) at 02/27/16 1347 Last data filed at 02/27/16  0131  Gross per 24 hour  Intake               50 ml  Output                0 ml  Net               50 ml     Physical Exam  Gen: not in distress HEENT: no pallor, No icterus, moist mucosa, supple neck Chest: clear b/l, no added sounds CVS: N S1&S2, no murmurs,  [id number] abdomen with ascites , tenderness over right upper quadrant  Musculoskeletal: warm, no edema [id number] and oriented   Data Review:    CBC  Recent Labs Lab 02/26/16 1424 02/27/16 0315  WBC 6.7 6.8  HGB 14.2 14.0  HCT 41.6 41.3  PLT 242 270  MCV 100.2* 99.8  MCH 34.2* 33.8  MCHC 34.1 33.9  RDW 14.1 14.0  LYMPHSABS 1.7  --   MONOABS 0.8  --   EOSABS 0.1  --   BASOSABS 0.0  --     Chemistries   Recent Labs Lab 02/26/16 1424 02/27/16 0315  NA 135 133*  K 4.1 4.2  CL 99* 101  CO2 27 24  GLUCOSE 109* 107*  BUN 11 13  CREATININE 0.99 1.02  CALCIUM 8.5* 8.5*  AST 139* 136*  ALT 184* 176*  ALKPHOS 327* 290*  BILITOT 1.9* 1.8*   ------------------------------------------------------------------------------------------------------------------ No results for input(s): CHOL, HDL, LDLCALC, TRIG, CHOLHDL, LDLDIRECT in the last 72 hours.  No results found for: HGBA1C ------------------------------------------------------------------------------------------------------------------ No results for input(s): TSH, T4TOTAL, T3FREE, THYROIDAB in the last 72 hours.  Invalid input(s): FREET3 ------------------------------------------------------------------------------------------------------------------ No results for input(s): VITAMINB12, FOLATE, FERRITIN, TIBC, IRON, RETICCTPCT in the last 72 hours.  Coagulation profile  Recent Labs Lab 02/26/16 1424  INR  1.11    No results for input(s): DDIMER in the last 72 hours.  Cardiac Enzymes No results for input(s): CKMB, TROPONINI, MYOGLOBIN in the last 168 hours.  Invalid input(s):  CK ------------------------------------------------------------------------------------------------------------------ No results found for: BNP  Inpatient Medications  Scheduled Meds: . enoxaparin (LOVENOX) injection  40 mg Subcutaneous Daily  . lidocaine       Continuous Infusions: . piperacillin-tazobactam (ZOSYN)  IV 3.375 g (02/27/16 1113)   PRN Meds:.fentaNYL (SUBLIMAZE) injection, ondansetron **OR** ondansetron (ZOFRAN) IV  Micro Results Recent Results (from the past 240 hour(s))  Gram stain     Status: None   Collection Time: 02/27/16 10:27 AM  Result Value Ref Range Status   Specimen Description FLUID PERITONEAL  Final   Special Requests NONE  Final   Gram Stain   Final    RARE WBC PRESENT, PREDOMINANTLY MONONUCLEAR NO ORGANISMS SEEN    Report Status 02/27/2016 FINAL  Final    Radiology Reports Ct Abdomen Pelvis W Contrast  Result Date: 02/26/2016 CLINICAL DATA:  Right-sided abdominal pain. EXAM: CT ABDOMEN AND PELVIS WITH CONTRAST TECHNIQUE: Multidetector CT imaging of the abdomen and pelvis was performed using the standard protocol following bolus administration of intravenous contrast. CONTRAST:  100 mL of Isovue-300 intravenously. COMPARISON:  CT scan of Jun 16, 2003. FINDINGS: Lower chest: Mild bilateral posterior basilar subsegmental atelectasis is noted. Hepatobiliary: No gallstones are noted. Mild intrahepatic and extrahepatic biliary dilatation is noted. No focal mass or lesion is noted in the liver. The liver does appear to be slightly moved smaller with slightly nodular contours suggesting hepatic cirrhosis. Pancreas: Unremarkable. No pancreatic ductal dilatation or surrounding inflammatory changes. Spleen: Normal in size without focal abnormality. Adrenals/Urinary Tract: Adrenal glands are unremarkable. Kidneys are normal, without renal calculi, focal lesion, or hydronephrosis. Bladder is unremarkable. Stomach/Bowel: There is no evidence of bowel obstruction.  Vascular/Lymphatic: No significant vascular findings are present. No enlarged abdominal or pelvic lymph nodes. Reproductive: Mild prostatic enlargement is noted with associated calcification. Other: Moderate ascites is noted. Irregular thickening is seen involving the omentum ; is uncertain if this represents inflammation or edema, or possibly carcinomatosis. Small left fat containing inguinal hernia is noted. Musculoskeletal: No acute or significant osseous findings. IMPRESSION: Mild intrahepatic and extrahepatic biliary dilatation is noted. Correlation with liver function tests is recommended to rule out obstruction. Findings are suggestive of possible hepatic cirrhosis. Moderate ascites is noted. Irregular thickening is seen involving the omentum ; it is uncertain if this represents inflammation or edema, or possibly carcinomatosis. Clinical correlation is recommended. Electronically Signed   By: Marijo Conception, M.D.   On: 02/26/2016 16:33   US Paracentesis  Result Date: 02/27/2016 INDICATION: Should with right upper quadrant abdominal pain for 2 weeks. Recent imaging shows possible cirrhosis with moderate ascites. Request is made for diagnostic and therapeutic paracentesis. EXAM: ULTRASOUND GUIDED DIAGNOSTIC AND THERAPEUTIC PARACENTESIS MEDICATIONS: 1% lidocaine COMPLICATIONS: None immediate. PROCEDURE: Informed written consent was obtained from the patient after a discussion of the risks, benefits and alternatives to treatment. A timeout was performed prior to the initiation of the procedure. Initial ultrasound scanning demonstrates a small amount of ascites within the left lower abdominal quadrant. The left lower abdomen was prepped and draped in the usual sterile fashion. 1% lidocaine was used for local anesthesia. Following this, a 19 gauge, 7-cm, Yueh catheter was introduced. An ultrasound image was saved for documentation purposes. The paracentesis was performed. The catheter was removed and a  dressing was applied. The patient tolerated the  procedure well without immediate post procedural complication. FINDINGS: A total of approximately 3.7 L of yellow fluid was removed. Samples were sent to the laboratory as requested by the clinical team. IMPRESSION: Successful ultrasound-guided paracentesis yielding 3.7 liters of peritoneal fluid. Read by: Saverio Danker, PA-C Electronically Signed   By: Jerilynn Mages.  Shick M.D.   On: 02/27/2016 12:08   Dg Chest Port 1 View  Result Date: 02/26/2016 CLINICAL DATA:  Right middle flank pain. Abdominal distension with nausea, no vomiting. EXAM: PORTABLE CHEST 1 VIEW COMPARISON:  None. FINDINGS: 1514 hours. The heart size and mediastinal contours are normal. There is mild left-greater-than-right basilar atelectasis. No edema, confluent airspace opacity or significant pleural effusion. The bones appear unremarkable. IMPRESSION: Mild bibasilar atelectasis.  No acute cardiopulmonary process. Electronically Signed   By: Richardean Sale M.D.   On: 02/26/2016 15:29   US Abdomen Limited Ruq  Result Date: 02/26/2016 CLINICAL DATA:  Acute onset of right upper quadrant abdominal pain. Initial encounter. EXAM: US ABDOMEN LIMITED - RIGHT UPPER QUADRANT COMPARISON:  CT of the abdomen and pelvis performed earlier today at 4:02 p.m. FINDINGS: Gallbladder: The gallbladder has a diffusely thick-walled appearance. This is nonspecific in the presence of ascites. No stones are seen. No ultrasonographic Murphy's sign is elicited. Common bile duct: Diameter: 0.9 cm, raising question for distal obstruction. There is suggestion of mild debris within the mid common bile duct. Liver: No focal lesion identified. Nodular contour, compatible with hepatic cirrhosis. Mild prominence of the intrahepatic biliary ducts noted. Moderate volume ascites is seen within the abdomen. IMPRESSION: 1. Findings of hepatic cirrhosis, with moderate volume ascites in the abdomen. 2. Prominence of the common bile duct,  measuring 0.9 cm, raising question for distal obstruction. Mild prominence of the intrahepatic biliary ducts. Suggestion of underlying debris within the mid common bile duct. 3. Diffusely thick-walled gallbladder appearance is nonspecific in the presence of ascites. Gallbladder otherwise unremarkable. Electronically Signed   By: Garald Balding M.D.   On: 02/26/2016 21:40    Time Spent in minutes  25   Louellen Molder M.D on 02/27/2016 at 1:47 PM  Between 7am to 7pm - Pager - [phone]  After 7pm go to [email or web address] - password [facility]  [facility] -  Office  [phone]

## 2016-02-27 NOTE — ED Notes (Signed)
Patient here for abdominal pain and distention.  Has US paracentesis pending. Pain being controlled by fentanyl every 4 hours.  Patient is able to sleep without pain at this time.  Hemodynamically stable at this time potassium is 4.1 LFTs are elevated, correlated with cirrhosis of the liver.  Patient is NPO started 02-26-16 at 2300.

## 2016-02-27 NOTE — ED Notes (Signed)
Patient CBG was 103.

## 2016-02-27 NOTE — ED Notes (Addendum)
Pt feels like his abd is filling up again and starting to feel tight. Upon assessment, pt's abd feels distended. Immediately after paracentesis, pt's abd felt softer and pt was comfortable. Bowel sounds present. Paged MD to notify. Will keep pt NPO for now per MD.

## 2016-02-27 NOTE — Procedures (Signed)
Ultrasound-guided diagnostic and therapeutic paracentesis performed yielding 3.7 liters of yellow colored fluid. No immediate complications.  Matthew Waller E 12:05 PM 02/27/2016

## 2016-02-27 NOTE — Consult Note (Signed)
Reason for Consult: Choledocholithiasis Referring Physician: [facility]  Sol Blazing Burkholder HPI: This is a 71 year old make without significant PMH admitted for abdominal pain and abnormal liver enzymes.  His pain started two weeks ago and it progressively worsened.  He sought care from Urgent Care on Sunday as his wife noted that his urine was very dark and to her it seemed as if it was bloody.  Subsequently he followed up with his PCP the next day and with their advice, he was recommended to present to the hospital for further evaluation and treatment.  The pain was described as a sharp and throbbing pain in the RUQ and it was inconsistently associated with nausea.  In the ER his CT scan was significant for intra and extrahepatic biliary ductal dilation, but no cholelithiasis or choledocholithiasis.  The liver appeared to be nodular in contour, thickened omentum, and there was a moderate amount of ascites.  His liver enzymes were in an obstructive pattern.  No elevation in his WBC.  The RUQ ultrasound was suggestive of some debris in the CBD.  A paracentesis was performed and the SAAG is at 0.3 with a total protein of 4.4.  The PMN count is also very low.  Past Medical History:  Diagnosis Date  . Abdominal distension 02/2016    Past Surgical History:  Procedure Laterality Date  . COSMETIC SURGERY     eyebrows  . DENTAL SURGERY      History reviewed. No pertinent family history.  Social History:  reports that he quit smoking about 53 years ago. He has never used smokeless tobacco. He reports that he drinks alcohol. He reports that he does not use drugs.  Allergies: No Known Allergies  Medications:  Scheduled: . enoxaparin (LOVENOX) injection  40 mg Subcutaneous Daily  . lidocaine      . piperacillin-tazobactam (ZOSYN)  IV  3.375 g Intravenous Q8H   Continuous:   Results for orders placed or performed during the hospital encounter of 02/26/16 (from the past 24 hour(s))  PSA      Status: None   Collection Time: 02/27/16  3:15 AM  Result Value Ref Range   PSA 0.57 0.00 - 4.00 ng/mL  Comprehensive metabolic panel     Status: Abnormal   Collection Time: 02/27/16  3:15 AM  Result Value Ref Range   Sodium 133 (L) 135 - 145 mmol/L   Potassium 4.2 3.5 - 5.1 mmol/L   Chloride 101 101 - 111 mmol/L   CO2 24 22 - 32 mmol/L   Glucose, Bld 107 (H) 65 - 99 mg/dL   BUN 13 6 - 20 mg/dL   Creatinine, Ser 1.02 0.61 - 1.24 mg/dL   Calcium 8.5 (L) 8.9 - 10.3 mg/dL   Total Protein 6.2 (L) 6.5 - 8.1 g/dL   Albumin 2.9 (L) 3.5 - 5.0 g/dL   AST 136 (H) 15 - 41 U/L   ALT 176 (H) 17 - 63 U/L   Alkaline Phosphatase 290 (H) 38 - 126 U/L   Total Bilirubin 1.8 (H) 0.3 - 1.2 mg/dL   GFR calc non Af Amer >60 >60 mL/min   GFR calc Af Amer >60 >60 mL/min   Anion gap 8 5 - 15  CBC     Status: Abnormal   Collection Time: 02/27/16  3:15 AM  Result Value Ref Range   WBC 6.8 4.0 - 10.5 K/uL   RBC 4.14 (L) 4.22 - 5.81 MIL/uL   Hemoglobin 14.0 13.0 - 17.0  g/dL   HCT 41.3 39.0 - 52.0 %   MCV 99.8 78.0 - 100.0 fL   MCH 33.8 26.0 - 34.0 pg   MCHC 33.9 30.0 - 36.0 g/dL   RDW 14.0 11.5 - 15.5 %   Platelets 270 150 - 400 K/uL  Urinalysis, Routine w reflex microscopic     Status: Abnormal   Collection Time: 02/27/16  3:16 AM  Result Value Ref Range   Color, Urine AMBER (A) YELLOW   APPearance CLEAR CLEAR   Specific Gravity, Urine 1.041 (H) 1.005 - 1.030   pH 5.0 5.0 - 8.0   Glucose, UA NEGATIVE NEGATIVE mg/dL   Hgb urine dipstick NEGATIVE NEGATIVE   Bilirubin Urine NEGATIVE NEGATIVE   Ketones, ur 5 (A) NEGATIVE mg/dL   Protein, ur NEGATIVE NEGATIVE mg/dL   Nitrite NEGATIVE NEGATIVE   Leukocytes, UA NEGATIVE NEGATIVE  CBG monitoring, ED     Status: Abnormal   Collection Time: 02/27/16  7:37 AM  Result Value Ref Range   Glucose-Capillary 103 (H) 65 - 99 mg/dL   Comment 1 Notify RN    Comment 2 Document in Chart   Albumin, pleural or peritoneal fluid     Status: None   Collection  Time: 02/27/16 10:27 AM  Result Value Ref Range   Albumin, Fluid 2.6 g/dL   Fluid Type-FALB Peritoneal   Lactate dehydrogenase (CSF, pleural or peritoneal fluid)     Status: Abnormal   Collection Time: 02/27/16 10:27 AM  Result Value Ref Range   LD, Fluid 445 (H) 3 - 23 U/L   Fluid Type-FLDH Peritoneal   Protein, pleural or peritoneal fluid     Status: None   Collection Time: 02/27/16 10:27 AM  Result Value Ref Range   Total protein, fluid 4.4 g/dL   Fluid Type-FTP Peritoneal   Body fluid cell count with differential     Status: Abnormal   Collection Time: 02/27/16 10:27 AM  Result Value Ref Range   Fluid Type-FCT Peritoneal    Color, Fluid YELLOW (A) YELLOW   Appearance, Fluid HAZY (A) CLEAR   WBC, Fluid 1,102 (H) 0 - 1,000 cu mm   Neutrophil Count, Fluid 4 0 - 25 %   Lymphs, Fluid 59 %   Monocyte-Macrophage-Serous Fluid 37 (L) 50 - 90 %   Eos, Fluid 0 %   Other Cells, Fluid Reactive mesothelial cells noted. %  Gram stain     Status: None   Collection Time: 02/27/16 10:27 AM  Result Value Ref Range   Specimen Description FLUID PERITONEAL    Special Requests NONE    Gram Stain      RARE WBC PRESENT, PREDOMINANTLY MONONUCLEAR NO ORGANISMS SEEN    Report Status 02/27/2016 FINAL   Glucose, pleural or peritoneal fluid     Status: None   Collection Time: 02/27/16 10:27 AM  Result Value Ref Range   Glucose, Fluid 88 mg/dL   Fluid Type-FGLU Peritoneal   CBG monitoring, ED     Status: None   Collection Time: 02/27/16 11:25 AM  Result Value Ref Range   Glucose-Capillary 93 65 - 99 mg/dL  Lactate dehydrogenase     Status: Abnormal   Collection Time: 02/27/16  3:26 PM  Result Value Ref Range   LDH 209 (H) 98 - 192 U/L     Ct Abdomen Pelvis W Contrast  Result Date: 02/26/2016 CLINICAL DATA:  Right-sided abdominal pain. EXAM: CT ABDOMEN AND PELVIS WITH CONTRAST TECHNIQUE: Multidetector CT imaging of the abdomen and  pelvis was performed using the standard protocol following  bolus administration of intravenous contrast. CONTRAST:  100 mL of Isovue-300 intravenously. COMPARISON:  CT scan of Jun 16, 2003. FINDINGS: Lower chest: Mild bilateral posterior basilar subsegmental atelectasis is noted. Hepatobiliary: No gallstones are noted. Mild intrahepatic and extrahepatic biliary dilatation is noted. No focal mass or lesion is noted in the liver. The liver does appear to be slightly moved smaller with slightly nodular contours suggesting hepatic cirrhosis. Pancreas: Unremarkable. No pancreatic ductal dilatation or surrounding inflammatory changes. Spleen: Normal in size without focal abnormality. Adrenals/Urinary Tract: Adrenal glands are unremarkable. Kidneys are normal, without renal calculi, focal lesion, or hydronephrosis. Bladder is unremarkable. Stomach/Bowel: There is no evidence of bowel obstruction. Vascular/Lymphatic: No significant vascular findings are present. No enlarged abdominal or pelvic lymph nodes. Reproductive: Mild prostatic enlargement is noted with associated calcification. Other: Moderate ascites is noted. Irregular thickening is seen involving the omentum ; is uncertain if this represents inflammation or edema, or possibly carcinomatosis. Small left fat containing inguinal hernia is noted. Musculoskeletal: No acute or significant osseous findings. IMPRESSION: Mild intrahepatic and extrahepatic biliary dilatation is noted. Correlation with liver function tests is recommended to rule out obstruction. Findings are suggestive of possible hepatic cirrhosis. Moderate ascites is noted. Irregular thickening is seen involving the omentum ; it is uncertain if this represents inflammation or edema, or possibly carcinomatosis. Clinical correlation is recommended. Electronically Signed   By: Marijo Conception, M.D.   On: 02/26/2016 16:33   US Paracentesis  Result Date: 02/27/2016 INDICATION: Should with right upper quadrant abdominal pain for 2 weeks. Recent imaging shows  possible cirrhosis with moderate ascites. Request is made for diagnostic and therapeutic paracentesis. EXAM: ULTRASOUND GUIDED DIAGNOSTIC AND THERAPEUTIC PARACENTESIS MEDICATIONS: 1% lidocaine COMPLICATIONS: None immediate. PROCEDURE: Informed written consent was obtained from the patient after a discussion of the risks, benefits and alternatives to treatment. A timeout was performed prior to the initiation of the procedure. Initial ultrasound scanning demonstrates a small amount of ascites within the left lower abdominal quadrant. The left lower abdomen was prepped and draped in the usual sterile fashion. 1% lidocaine was used for local anesthesia. Following this, a 19 gauge, 7-cm, Yueh catheter was introduced. An ultrasound image was saved for documentation purposes. The paracentesis was performed. The catheter was removed and a dressing was applied. The patient tolerated the procedure well without immediate post procedural complication. FINDINGS: A total of approximately 3.7 L of yellow fluid was removed. Samples were sent to the laboratory as requested by the clinical team. IMPRESSION: Successful ultrasound-guided paracentesis yielding 3.7 liters of peritoneal fluid. Read by: Saverio Danker, PA-C Electronically Signed   By: Jerilynn Mages.  Shick M.D.   On: 02/27/2016 12:08   Dg Chest Port 1 View  Result Date: 02/26/2016 CLINICAL DATA:  Right middle flank pain. Abdominal distension with nausea, no vomiting. EXAM: PORTABLE CHEST 1 VIEW COMPARISON:  None. FINDINGS: 1514 hours. The heart size and mediastinal contours are normal. There is mild left-greater-than-right basilar atelectasis. No edema, confluent airspace opacity or significant pleural effusion. The bones appear unremarkable. IMPRESSION: Mild bibasilar atelectasis.  No acute cardiopulmonary process. Electronically Signed   By: Richardean Sale M.D.   On: 02/26/2016 15:29   US Abdomen Limited Ruq  Result Date: 02/26/2016 CLINICAL DATA:  Acute onset of right upper  quadrant abdominal pain. Initial encounter. EXAM: US ABDOMEN LIMITED - RIGHT UPPER QUADRANT COMPARISON:  CT of the abdomen and pelvis performed earlier today at 4:02 p.m. FINDINGS: Gallbladder: The gallbladder  has a diffusely thick-walled appearance. This is nonspecific in the presence of ascites. No stones are seen. No ultrasonographic Murphy's sign is elicited. Common bile duct: Diameter: 0.9 cm, raising question for distal obstruction. There is suggestion of mild debris within the mid common bile duct. Liver: No focal lesion identified. Nodular contour, compatible with hepatic cirrhosis. Mild prominence of the intrahepatic biliary ducts noted. Moderate volume ascites is seen within the abdomen. IMPRESSION: 1. Findings of hepatic cirrhosis, with moderate volume ascites in the abdomen. 2. Prominence of the common bile duct, measuring 0.9 cm, raising question for distal obstruction. Mild prominence of the intrahepatic biliary ducts. Suggestion of underlying debris within the mid common bile duct. 3. Diffusely thick-walled gallbladder appearance is nonspecific in the presence of ascites. Gallbladder otherwise unremarkable. Electronically Signed   By: Garald Balding M.D.   On: 02/26/2016 21:40    ROS:  As stated above in the HPI otherwise negative.  Blood pressure (!) 158/66, pulse 83, temperature 98.2 F (36.8 C), temperature source Oral, resp. rate 18, height 6\' 3"  (1.905 m), weight 104.3 kg (230 lb), SpO2 97 %.    PE: Gen: NAD, Alert and Oriented HEENT:  [place]/AT, EOMI Neck: Supple, no LAD Lungs: CTA Bilaterally CV: RRR without M/G/R ABM: Soft, nontender, + ascites, +BS Ext: mild edema.  Assessment/Plan: 1) Probable biliary obstruction. 2) ? Carcinomatosis. 3) Ascites.   The current SAAG calculation suggests a malignant process.  There is no overt mass causing the biliary obstruction, but further evaluation and treatment with an ERCP is appropriate.  He may also require an EUS pending the  findings of the ERCP.  Stone may still be a consideration for the biliary findings, but this cannot explain the ascites and SAAG.  Even after his paracentesis he states that his ascites is reaccumulating.    Plan: 1) ERCP tomorrow.  Possible stent placement. 2) Await cytology form the peritoneal fluid. Rock Sobol D 02/27/2016, 4:20 PM

## 2016-02-27 NOTE — ED Notes (Signed)
Pt comfortable. Denies need for pain medications at this time

## 2016-02-28 ENCOUNTER — Inpatient Hospital Stay (HOSPITAL_COMMUNITY): Payer: PPO | Admitting: Certified Registered"

## 2016-02-28 ENCOUNTER — Inpatient Hospital Stay (HOSPITAL_COMMUNITY): Payer: PPO

## 2016-02-28 ENCOUNTER — Encounter (HOSPITAL_COMMUNITY)
Admission: EM | Disposition: A | Discharge status: Home / Self Care | Payer: Self-pay | Source: Home / Self Care | Attending: Internal Medicine

## 2016-02-28 ENCOUNTER — Encounter (HOSPITAL_COMMUNITY): Payer: Self-pay

## 2016-02-28 HISTORY — PX: ERCP: SHX5425

## 2016-02-28 LAB — CANCER ANTIGEN 19-9: CA 19 9: 80 U/mL — AB (ref 0–35)

## 2016-02-28 LAB — PH, BODY FLUID: PH, BODY FLUID: 7.8

## 2016-02-28 LAB — HEPATIC FUNCTION PANEL
ALT: 189 U/L — ABNORMAL HIGH (ref 17–63)
AST: 163 U/L — AB (ref 15–41)
Albumin: 2.6 g/dL — ABNORMAL LOW (ref 3.5–5.0)
Alkaline Phosphatase: 328 U/L — ABNORMAL HIGH (ref 38–126)
Bilirubin, Direct: 1.4 mg/dL — ABNORMAL HIGH (ref 0.1–0.5)
Indirect Bilirubin: 1.6 mg/dL — ABNORMAL HIGH (ref 0.3–0.9)
TOTAL PROTEIN: 5.9 g/dL — AB (ref 6.5–8.1)
Total Bilirubin: 3 mg/dL — ABNORMAL HIGH (ref 0.3–1.2)

## 2016-02-28 LAB — URINE CULTURE: Culture: NO GROWTH

## 2016-02-28 LAB — MISC LABCORP TEST (SEND OUT): Labcorp test code: 88062

## 2016-02-28 LAB — AFP TUMOR MARKER: AFP TUMOR MARKER: 1.6 ng/mL (ref 0.0–8.3)

## 2016-02-28 LAB — CEA: CEA: 1 ng/mL (ref 0.0–4.7)

## 2016-02-28 LAB — TRIGLYCERIDES, BODY FLUIDS: Triglycerides, Fluid: 25 mg/dL

## 2016-02-28 SURGERY — ERCP, WITH INTERVENTION IF INDICATED
Anesthesia: General

## 2016-02-28 MED ORDER — SODIUM CHLORIDE 0.9 % IV SOLN
INTRAVENOUS | Status: DC | PRN
  Administered 2016-02-28: 25 mL

## 2016-02-28 MED ORDER — SUGAMMADEX SODIUM 200 MG/2ML IV SOLN
INTRAVENOUS | Status: DC | PRN
  Administered 2016-02-28: 200 mg via INTRAVENOUS

## 2016-02-28 MED ORDER — ROCURONIUM BROMIDE 100 MG/10ML IV SOLN
INTRAVENOUS | Status: DC | PRN
  Administered 2016-02-28: 40 mg via INTRAVENOUS

## 2016-02-28 MED ORDER — FENTANYL CITRATE (PF) 100 MCG/2ML IJ SOLN
INTRAMUSCULAR | Status: DC | PRN
  Administered 2016-02-28: 25 ug via INTRAVENOUS

## 2016-02-28 MED ORDER — LACTATED RINGERS IV SOLN
INTRAVENOUS | Status: DC | PRN
  Administered 2016-02-28: 12:00:00 via INTRAVENOUS

## 2016-02-28 MED ORDER — IOPAMIDOL (ISOVUE-300) INJECTION 61%
INTRAVENOUS | Status: AC
  Filled 2016-02-28: qty 50

## 2016-02-28 MED ORDER — PROPOFOL 10 MG/ML IV BOLUS
INTRAVENOUS | Status: DC | PRN
  Administered 2016-02-28: 200 mg via INTRAVENOUS

## 2016-02-28 MED ORDER — INDOMETHACIN 50 MG RE SUPP
RECTAL | Status: DC | PRN
  Administered 2016-02-28: 100 mg via RECTAL

## 2016-02-28 MED ORDER — ONDANSETRON HCL 4 MG/2ML IJ SOLN
INTRAMUSCULAR | Status: DC | PRN
  Administered 2016-02-28: 4 mg via INTRAVENOUS

## 2016-02-28 MED ORDER — INDOMETHACIN 50 MG RE SUPP
RECTAL | Status: AC
  Filled 2016-02-28: qty 2

## 2016-02-28 MED ORDER — LIDOCAINE HCL (CARDIAC) 20 MG/ML IV SOLN
INTRAVENOUS | Status: DC | PRN
  Administered 2016-02-28: 100 mg via INTRAVENOUS

## 2016-02-28 NOTE — Transfer of Care (Signed)
Immediate Anesthesia Transfer of Care Note  Patient: Matthew Waller  Procedure(s) Performed: Procedure(s): ENDOSCOPIC RETROGRADE CHOLANGIOPANCREATOGRAPHY (ERCP) (N/A)  Patient Location: Endoscopy Unit  Anesthesia Type:General  Level of Consciousness: awake, alert  and oriented  Airway & Oxygen Therapy: Patient Spontanous Breathing  Post-op Assessment: Report given to RN  Post vital signs: Reviewed and stable  Last Vitals:  Vitals:   02/28/16 1106 02/28/16 1320  BP: (!) 148/77 (!) 157/74  Pulse: 76 78  Resp: 20 14  Temp: 36.8 C     Last Pain:  Vitals:   02/28/16 1320  TempSrc: Oral  PainSc:          Complications: No apparent anesthesia complications

## 2016-02-28 NOTE — Anesthesia Postprocedure Evaluation (Signed)
Anesthesia Post Note  Patient: Matthew Waller  Procedure(s) Performed: Procedure(s) (LRB): ENDOSCOPIC RETROGRADE CHOLANGIOPANCREATOGRAPHY (ERCP) (N/A)  Patient location during evaluation: PACU Anesthesia Type: General Level of consciousness: sedated and patient cooperative Pain management: pain level controlled Vital Signs Assessment: post-procedure vital signs reviewed and stable Respiratory status: spontaneous breathing Cardiovascular status: stable Anesthetic complications: no       Last Vitals:  Vitals:   02/28/16 1410 02/28/16 1418  BP: (!) 151/101 (!) 164/86  Pulse: 70 64  Resp: 15 (!) 21  Temp:      Last Pain:  Vitals:   02/28/16 1320  TempSrc: Oral  PainSc:                  [name]

## 2016-02-28 NOTE — Progress Notes (Signed)
PROGRESS NOTE                                                                                                                                                                                                             Patient Demographics:    Matthew Waller, is a 71 y.o. male, DOB - Mar 05, 1945, [id number]  Admit date - 02/26/2016   Admitting Physician Theodis Blaze, MD  Outpatient Primary MD for the patient is [name], MD  LOS - 2  Outpatient Specialists: NONE  Chief Complaint  Patient presents with  . Abdominal Pain       Brief Narrative  71 year old male with a past medical history presented to the ED with progressive abdominal discomfort for 2 weeks duration. He had multiple episodes of diarrhea and poor by mouth intake. The diarrhea lasts for 5 days and subsided. He then started having sharp right upper quadrant pain, nonradiating and associated with nausea but no vomiting. He noticed progressive abdominal distention. No prior symptoms in the past. Reports very occasional alcohol use (1-2 times a month), denies recent local or distant travel, sick contact, family history of liver disease, IV drug use or being on any prescription medications or over-the-counter supplements. He denies any weight loss. Did report that his urine was dark. Denied any fevers or chills, chest pain, palpitations, shortness of breath, weakness or numbness in his extremities.  In the ED vitals were stable. Physical exam showed distended abdomen. Labs showed transaminitis and. CT of the abdomen and pelvis showed mild intrahepatic and extremity biliary dilatation concerning for obstruction with possible hepatic cirrhosis and moderate ascites. Also irregular thickening of the omentum with possible inflammation versus edema versus carcinomatosis.  Admitted to hospitalist service and GI consulted.      Subjective:   Developed abdominal  distention shortly after paracentesis. Still has some nausea.  Assessment  & Plan :    Principal Problem:   Abdominal distension With transaminitis Finding on CT abdomen, high CA-19-9 and rapid reoccurrence of ascites after paracentesis indicating high suspicion for malignancy. LFTs are worse today. Ascites fluid with high WBC (1100), with reactive mesothelial cells and high fluid LDH... On empiric Zosyn or possible SBP.Marland Kitchen Continue when necessary fentanyl for pain. Hepatitis panel negative. Check AFP, CEA  negative. Ascites fluid cytology pending. Patient scheduled for ERCP today.  Will plan  for repeat large-volume paracentesis tomorrow.      Code Status : Full code  Family Communication  : Wife at bedside Disposition Plan  : Pending hospital course  Barriers For Discharge : Active symptoms  Consults  :  GI (Dr. Benson Norway)   Procedures  : CT abdomen pelvis Ultrasound abdomen Abdominal Paracentesis ERCP  DVT Prophylaxis  :  Lovenox -   Lab Results  Component Value Date   PLT 270 02/27/2016    Antibiotics  :   Anti-infectives    Start     Dose/Rate Route Frequency Ordered Stop   02/27/16 0300  [MAR Hold]  piperacillin-tazobactam (ZOSYN) IVPB 3.375 g     (MAR Hold since 02/28/16 1058)   3.375 g 12.5 mL/hr over 240 Minutes Intravenous Every 8 hours 02/26/16 2010     02/26/16 2015  piperacillin-tazobactam (ZOSYN) IVPB 3.375 g     3.375 g 100 mL/hr over 30 Minutes Intravenous  Once 02/26/16 2010 02/27/16 0131        Objective:   Vitals:   02/27/16 1419 02/27/16 2057 02/28/16 0522 02/28/16 1106  BP: (!) 158/66 (!) 106/48 110/73 (!) 148/77  Pulse: 83 72 72 76  Resp: 18 18 18 20   Temp: 98.2 F (36.8 C) 100 F (37.8 C) 98.4 F (36.9 C) 98.2 F (36.8 C)  TempSrc: Oral Oral Oral Oral  SpO2: 97% 95% 93% 98%  Weight:      Height:        Wt Readings from Last 3 Encounters:  02/26/16 104.3 kg (230 lb)     Intake/Output Summary (Last 24 hours) at 02/28/16  1135 Last data filed at 02/28/16 1055  Gross per 24 hour  Intake              100 ml  Output              325 ml  Net             -225 ml     Physical Exam  Gen: not in distress HEENT:  No icterus, moist mucosa, supple neck Chest: clear b/l, no added sounds CVS: N S1&S2, no murmurs,  [id number] abdomen with ascites , tenderness over right upper quadrant  Musculoskeletal: warm, no edema    Data Review:    CBC  Recent Labs Lab 02/26/16 1424 02/27/16 0315  WBC 6.7 6.8  HGB 14.2 14.0  HCT 41.6 41.3  PLT 242 270  MCV 100.2* 99.8  MCH 34.2* 33.8  MCHC 34.1 33.9  RDW 14.1 14.0  LYMPHSABS 1.7  --   MONOABS 0.8  --   EOSABS 0.1  --   BASOSABS 0.0  --     Chemistries   Recent Labs Lab 02/26/16 1424 02/27/16 0315 02/28/16 0834  NA 135 133*  --   K 4.1 4.2  --   CL 99* 101  --   CO2 27 24  --   GLUCOSE 109* 107*  --   BUN 11 13  --   CREATININE 0.99 1.02  --   CALCIUM 8.5* 8.5*  --   AST 139* 136* 163*  ALT 184* 176* 189*  ALKPHOS 327* 290* 328*  BILITOT 1.9* 1.8* 3.0*   ------------------------------------------------------------------------------------------------------------------ No results for input(s): CHOL, HDL, LDLCALC, TRIG, CHOLHDL, LDLDIRECT in the last 72 hours.  No results found for: HGBA1C ------------------------------------------------------------------------------------------------------------------ No results for input(s): TSH, T4TOTAL, T3FREE, THYROIDAB in the last 72 hours.  Invalid input(s): FREET3 ------------------------------------------------------------------------------------------------------------------ No results for input(s): VITAMINB12, FOLATE,  FERRITIN, TIBC, IRON, RETICCTPCT in the last 72 hours.  Coagulation profile  Recent Labs Lab 02/26/16 1424  INR 1.11    No results for input(s): DDIMER in the last 72 hours.  Cardiac Enzymes No results for input(s): CKMB, TROPONINI, MYOGLOBIN in the last 168  hours.  Invalid input(s): CK ------------------------------------------------------------------------------------------------------------------ No results found for: BNP  Inpatient Medications  Scheduled Meds: . [MAR Hold] enoxaparin (LOVENOX) injection  40 mg Subcutaneous Daily  . [MAR Hold] piperacillin-tazobactam (ZOSYN)  IV  3.375 g Intravenous Q8H   Continuous Infusions:  PRN Meds:.[MAR Hold] fentaNYL (SUBLIMAZE) injection, [MAR Hold] ondansetron **OR** [MAR Hold] ondansetron (ZOFRAN) IV  Micro Results Recent Results (from the past 240 hour(s))  Culture, Urine     Status: None   Collection Time: 02/27/16  3:16 AM  Result Value Ref Range Status   Specimen Description URINE, CLEAN CATCH  Final   Special Requests NONE  Final   Culture NO GROWTH  Final   Report Status 02/28/2016 FINAL  Final  Gram stain     Status: None   Collection Time: 02/27/16 10:27 AM  Result Value Ref Range Status   Specimen Description FLUID PERITONEAL  Final   Special Requests NONE  Final   Gram Stain   Final    RARE WBC PRESENT, PREDOMINANTLY MONONUCLEAR NO ORGANISMS SEEN    Report Status 02/27/2016 FINAL  Final    Radiology Reports Ct Abdomen Pelvis W Contrast  Result Date: 02/26/2016 CLINICAL DATA:  Right-sided abdominal pain. EXAM: CT ABDOMEN AND PELVIS WITH CONTRAST TECHNIQUE: Multidetector CT imaging of the abdomen and pelvis was performed using the standard protocol following bolus administration of intravenous contrast. CONTRAST:  100 mL of Isovue-300 intravenously. COMPARISON:  CT scan of Jun 16, 2003. FINDINGS: Lower chest: Mild bilateral posterior basilar subsegmental atelectasis is noted. Hepatobiliary: No gallstones are noted. Mild intrahepatic and extrahepatic biliary dilatation is noted. No focal mass or lesion is noted in the liver. The liver does appear to be slightly moved smaller with slightly nodular contours suggesting hepatic cirrhosis. Pancreas: Unremarkable. No pancreatic  ductal dilatation or surrounding inflammatory changes. Spleen: Normal in size without focal abnormality. Adrenals/Urinary Tract: Adrenal glands are unremarkable. Kidneys are normal, without renal calculi, focal lesion, or hydronephrosis. Bladder is unremarkable. Stomach/Bowel: There is no evidence of bowel obstruction. Vascular/Lymphatic: No significant vascular findings are present. No enlarged abdominal or pelvic lymph nodes. Reproductive: Mild prostatic enlargement is noted with associated calcification. Other: Moderate ascites is noted. Irregular thickening is seen involving the omentum ; is uncertain if this represents inflammation or edema, or possibly carcinomatosis. Small left fat containing inguinal hernia is noted. Musculoskeletal: No acute or significant osseous findings. IMPRESSION: Mild intrahepatic and extrahepatic biliary dilatation is noted. Correlation with liver function tests is recommended to rule out obstruction. Findings are suggestive of possible hepatic cirrhosis. Moderate ascites is noted. Irregular thickening is seen involving the omentum ; it is uncertain if this represents inflammation or edema, or possibly carcinomatosis. Clinical correlation is recommended. Electronically Signed   By: Marijo Conception, M.D.   On: 02/26/2016 16:33   US Paracentesis  Result Date: 02/27/2016 INDICATION: Should with right upper quadrant abdominal pain for 2 weeks. Recent imaging shows possible cirrhosis with moderate ascites. Request is made for diagnostic and therapeutic paracentesis. EXAM: ULTRASOUND GUIDED DIAGNOSTIC AND THERAPEUTIC PARACENTESIS MEDICATIONS: 1% lidocaine COMPLICATIONS: None immediate. PROCEDURE: Informed written consent was obtained from the patient after a discussion of the risks, benefits and alternatives to treatment. A timeout was performed  prior to the initiation of the procedure. Initial ultrasound scanning demonstrates a small amount of ascites within the left lower abdominal  quadrant. The left lower abdomen was prepped and draped in the usual sterile fashion. 1% lidocaine was used for local anesthesia. Following this, a 19 gauge, 7-cm, Yueh catheter was introduced. An ultrasound image was saved for documentation purposes. The paracentesis was performed. The catheter was removed and a dressing was applied. The patient tolerated the procedure well without immediate post procedural complication. FINDINGS: A total of approximately 3.7 L of yellow fluid was removed. Samples were sent to the laboratory as requested by the clinical team. IMPRESSION: Successful ultrasound-guided paracentesis yielding 3.7 liters of peritoneal fluid. Read by: Saverio Danker, PA-C Electronically Signed   By: Jerilynn Mages.  Shick M.D.   On: 02/27/2016 12:08   Dg Chest Port 1 View  Result Date: 02/26/2016 CLINICAL DATA:  Right middle flank pain. Abdominal distension with nausea, no vomiting. EXAM: PORTABLE CHEST 1 VIEW COMPARISON:  None. FINDINGS: 1514 hours. The heart size and mediastinal contours are normal. There is mild left-greater-than-right basilar atelectasis. No edema, confluent airspace opacity or significant pleural effusion. The bones appear unremarkable. IMPRESSION: Mild bibasilar atelectasis.  No acute cardiopulmonary process. Electronically Signed   By: Richardean Sale M.D.   On: 02/26/2016 15:29   US Abdomen Limited Ruq  Result Date: 02/26/2016 CLINICAL DATA:  Acute onset of right upper quadrant abdominal pain. Initial encounter. EXAM: US ABDOMEN LIMITED - RIGHT UPPER QUADRANT COMPARISON:  CT of the abdomen and pelvis performed earlier today at 4:02 p.m. FINDINGS: Gallbladder: The gallbladder has a diffusely thick-walled appearance. This is nonspecific in the presence of ascites. No stones are seen. No ultrasonographic Murphy's sign is elicited. Common bile duct: Diameter: 0.9 cm, raising question for distal obstruction. There is suggestion of mild debris within the mid common bile duct. Liver: No focal  lesion identified. Nodular contour, compatible with hepatic cirrhosis. Mild prominence of the intrahepatic biliary ducts noted. Moderate volume ascites is seen within the abdomen. IMPRESSION: 1. Findings of hepatic cirrhosis, with moderate volume ascites in the abdomen. 2. Prominence of the common bile duct, measuring 0.9 cm, raising question for distal obstruction. Mild prominence of the intrahepatic biliary ducts. Suggestion of underlying debris within the mid common bile duct. 3. Diffusely thick-walled gallbladder appearance is nonspecific in the presence of ascites. Gallbladder otherwise unremarkable. Electronically Signed   By: Garald Balding M.D.   On: 02/26/2016 21:40    Time Spent in minutes  25   Louellen Molder M.D on 02/28/2016 at 11:35 AM  Between 7am to 7pm - Pager - [phone]  After 7pm go to [email or web address] - password [facility]  [facility] -  Office  [phone]

## 2016-02-28 NOTE — Anesthesia Preprocedure Evaluation (Addendum)
Anesthesia Evaluation  Patient identified by MRN, date of birth, ID band Patient awake    Reviewed: Allergy & Precautions, NPO status , Patient's Chart, lab work & pertinent test results  Airway Mallampati: II  TM Distance: >3 FB Neck ROM: Full    Dental no notable dental hx. (+) Teeth Intact, Dental Advisory Given,    Pulmonary neg pulmonary ROS, former smoker,    Pulmonary exam normal breath sounds clear to auscultation       Cardiovascular negative cardio ROS Normal cardiovascular exam Rhythm:Regular Rate:Normal     Neuro/Psych negative neurological ROS  negative psych ROS   GI/Hepatic negative GI ROS, Neg liver ROS,   Endo/Other  negative endocrine ROS  Renal/GU negative Renal ROS     Musculoskeletal negative musculoskeletal ROS (+)   Abdominal   Peds  Hematology negative hematology ROS (+)   Anesthesia Other Findings   Reproductive/Obstetrics negative OB ROS                            Anesthesia Physical Anesthesia Plan  ASA: II  Anesthesia Plan: General   Post-op Pain Management:    Induction: Intravenous  Airway Management Planned: Oral ETT  Additional Equipment:   Intra-op Plan:   Post-operative Plan:   Informed Consent: I have reviewed the patients History and Physical, chart, labs and discussed the procedure including the risks, benefits and alternatives for the proposed anesthesia with the patient or authorized representative who has indicated his/her understanding and acceptance.   Dental advisory given  Plan Discussed with: CRNA  Anesthesia Plan Comments:         Anesthesia Quick Evaluation

## 2016-02-28 NOTE — Interval H&P Note (Signed)
History and Physical Interval Note:  02/28/2016 12:09 PM  Matthew Waller  has presented today for surgery, with the diagnosis of CBD stones  The various methods of treatment have been discussed with the patient and family. After consideration of risks, benefits and other options for treatment, the patient has consented to  Procedure(s): ENDOSCOPIC RETROGRADE CHOLANGIOPANCREATOGRAPHY (ERCP) (N/A) as a surgical intervention .  The patient's history has been reviewed, patient examined, no change in status, stable for surgery.  I have reviewed the patient's chart and labs.  Questions were answered to the patient's satisfaction.     Owen Pratte D

## 2016-02-28 NOTE — Anesthesia Procedure Notes (Signed)
Procedure Name: Intubation Date/Time: 02/28/2016 12:21 PM Performed by: Sampson Si E Pre-anesthesia Checklist: Patient identified, Emergency Drugs available, Suction available and Patient being monitored Patient Re-evaluated:Patient Re-evaluated prior to inductionOxygen Delivery Method: Circle System Utilized Preoxygenation: Pre-oxygenation with 100% oxygen Intubation Type: IV induction Ventilation: Mask ventilation without difficulty Laryngoscope Size: Mac and 3 Grade View: Grade I Tube type: Oral Tube size: 7.5 mm Number of attempts: 1 Airway Equipment and Method: Stylet and Oral airway Placement Confirmation: ETT inserted through vocal cords under direct vision,  positive ETCO2 and breath sounds checked- equal and bilateral Secured at: 23 cm Tube secured with: Tape Dental Injury: Teeth and Oropharynx as per pre-operative assessment

## 2016-02-28 NOTE — Op Note (Signed)
[facility] Patient Name: Matthew Waller Procedure Date : 02/28/2016 MRN: [id number] Attending MD: Carol Ada , MD Date of Birth: Jan 23, 1946 CSN: [id number] Age: 71 Admit Type: Inpatient Procedure:                ERCP Indications:              Biliary dilation on Computed Tomogram Scan Providers:                Carol Ada, MD, Cleda Daub, RN, Alfonso Patten,                            Technician, Sampson Si, CRNA Referring MD:              Medicines:                General Anesthesia Complications:            No immediate complications. Estimated Blood Loss:     Estimated blood loss: none. Procedure:                Pre-Anesthesia Assessment:                           - Prior to the procedure, a History and Physical                            was performed, and patient medications and                            allergies were reviewed. The patient's tolerance of                            previous anesthesia was also reviewed. The risks                            and benefits of the procedure and the sedation                            options and risks were discussed with the patient.                            All questions were answered, and informed consent                            was obtained. Prior Anticoagulants: The patient has                            taken no previous anticoagulant or antiplatelet                            agents. ASA Grade Assessment: I - A normal, healthy                            patient. After reviewing the risks and benefits,  the patient was deemed in satisfactory condition to                            undergo the procedure.                           - Sedation was administered by an anesthesia                            professional. General anesthesia was attained.                           After obtaining informed consent, the scope was                            passed under direct vision.  Throughout the                            procedure, the patient's blood pressure, pulse, and                            oxygen saturations were monitored continuously. The                            [id number]) scope was introduced through                            the mouth, and used to inject contrast into and                            used to inject contrast into the bile duct. The                            ERCP was accomplished without difficulty. The                            patient tolerated the procedure well. Scope In: Scope Out: Findings:      One 8.5 Fr by 7 cm plastic stent with a single external flap and a       single internal flap was placed 6 cm into the common bile duct. Bile       flowed through the stent. The stent was in good position. The upper       third of the main bile duct and hepatic duct bifurcation contained a       single segmental stenosis 3 mm in length.      Cannulation of the CBD was achieved during the first attempt. The       guidewire was secured in the right intrahepatic ducts. Contrast       injection revealed a cut off sign at the proximal CBD and there was       scant filling of contrast in the intrahepatic ducts. The CBD was dilated       to 8-10 mm. A 1 cm sphincterotomy was created and then a sweeping       balloon was used to perform an occlusion  cholangiogram. This revealed a       2-3 cm stricture at the bifircation and proximal CBD. This area was       brushed with a cytology brush and then an 8.5 Fr x 7 cm stent was       inserted. Excellent bile flow was achieved and fluoroscopy confirmed       excellent postioning of the stent. 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 The                            stricture was indeterminate. This stricture was                            treated with stent placement.                           - One plastic stent was placed into the common bile                             duct. Recommendation:           - Return patient to hospital ward for ongoing care.                           - Resume regular diet.                           - Continue present medications.                           - Await cytology results.                           - MRI/MRCP. Procedure Code(s):        --- Professional ---                           [phone], Endoscopic retrograde                            cholangiopancreatography (ERCP); with placement of                            endoscopic stent into biliary or pancreatic duct,                            including pre- and post-dilation and guide wire                            passage, when performed, including sphincterotomy,                            when performed, each stent Diagnosis Code(s):        --- Professional ---  K83.1, Obstruction of bile duct                           K83.8, Other specified diseases of biliary tract CPT copyright 2016 [employer]. All rights reserved. The codes documented in this report are preliminary and upon coder review may  be revised to meet current compliance requirements. Carol Ada, MD Carol Ada, MD 02/28/2016 1:21:23 PM This report has been signed electronically. Number of Addenda: 0

## 2016-02-28 NOTE — H&P (View-Only) (Signed)
Reason for Consult: Choledocholithiasis Referring Physician: [facility]  Matthew Waller HPI: This is a 71 year old make without significant PMH admitted for abdominal pain and abnormal liver enzymes.  His pain started two weeks ago and it progressively worsened.  He sought care from Urgent Care on Sunday as his wife noted that his urine was very dark and to her it seemed as if it was bloody.  Subsequently he followed up with his PCP the next day and with their advice, he was recommended to present to the hospital for further evaluation and treatment.  The pain was described as a sharp and throbbing pain in the RUQ and it was inconsistently associated with nausea.  In the ER his CT scan was significant for intra and extrahepatic biliary ductal dilation, but no cholelithiasis or choledocholithiasis.  The liver appeared to be nodular in contour, thickened omentum, and there was a moderate amount of ascites.  His liver enzymes were in an obstructive pattern.  No elevation in his WBC.  The RUQ ultrasound was suggestive of some debris in the CBD.  A paracentesis was performed and the SAAG is at 0.3 with a total protein of 4.4.  The PMN count is also very low.  Past Medical History:  Diagnosis Date  . Abdominal distension 02/2016    Past Surgical History:  Procedure Laterality Date  . COSMETIC SURGERY     eyebrows  . DENTAL SURGERY      History reviewed. No pertinent family history.  Social History:  reports that he quit smoking about 53 years ago. He has never used smokeless tobacco. He reports that he drinks alcohol. He reports that he does not use drugs.  Allergies: No Known Allergies  Medications:  Scheduled: . enoxaparin (LOVENOX) injection  40 mg Subcutaneous Daily  . lidocaine      . piperacillin-tazobactam (ZOSYN)  IV  3.375 g Intravenous Q8H   Continuous:   Results for orders placed or performed during the hospital encounter of 02/26/16 (from the past 24 hour(s))  PSA      Status: None   Collection Time: 02/27/16  3:15 AM  Result Value Ref Range   PSA 0.57 0.00 - 4.00 ng/mL  Comprehensive metabolic panel     Status: Abnormal   Collection Time: 02/27/16  3:15 AM  Result Value Ref Range   Sodium 133 (L) 135 - 145 mmol/L   Potassium 4.2 3.5 - 5.1 mmol/L   Chloride 101 101 - 111 mmol/L   CO2 24 22 - 32 mmol/L   Glucose, Bld 107 (H) 65 - 99 mg/dL   BUN 13 6 - 20 mg/dL   Creatinine, Ser 1.02 0.61 - 1.24 mg/dL   Calcium 8.5 (L) 8.9 - 10.3 mg/dL   Total Protein 6.2 (L) 6.5 - 8.1 g/dL   Albumin 2.9 (L) 3.5 - 5.0 g/dL   AST 136 (H) 15 - 41 U/L   ALT 176 (H) 17 - 63 U/L   Alkaline Phosphatase 290 (H) 38 - 126 U/L   Total Bilirubin 1.8 (H) 0.3 - 1.2 mg/dL   GFR calc non Af Amer >60 >60 mL/min   GFR calc Af Amer >60 >60 mL/min   Anion gap 8 5 - 15  CBC     Status: Abnormal   Collection Time: 02/27/16  3:15 AM  Result Value Ref Range   WBC 6.8 4.0 - 10.5 K/uL   RBC 4.14 (L) 4.22 - 5.81 MIL/uL   Hemoglobin 14.0 13.0 - 17.0  g/dL   HCT 41.3 39.0 - 52.0 %   MCV 99.8 78.0 - 100.0 fL   MCH 33.8 26.0 - 34.0 pg   MCHC 33.9 30.0 - 36.0 g/dL   RDW 14.0 11.5 - 15.5 %   Platelets 270 150 - 400 K/uL  Urinalysis, Routine w reflex microscopic     Status: Abnormal   Collection Time: 02/27/16  3:16 AM  Result Value Ref Range   Color, Urine AMBER (A) YELLOW   APPearance CLEAR CLEAR   Specific Gravity, Urine 1.041 (H) 1.005 - 1.030   pH 5.0 5.0 - 8.0   Glucose, UA NEGATIVE NEGATIVE mg/dL   Hgb urine dipstick NEGATIVE NEGATIVE   Bilirubin Urine NEGATIVE NEGATIVE   Ketones, ur 5 (A) NEGATIVE mg/dL   Protein, ur NEGATIVE NEGATIVE mg/dL   Nitrite NEGATIVE NEGATIVE   Leukocytes, UA NEGATIVE NEGATIVE  CBG monitoring, ED     Status: Abnormal   Collection Time: 02/27/16  7:37 AM  Result Value Ref Range   Glucose-Capillary 103 (H) 65 - 99 mg/dL   Comment 1 Notify RN    Comment 2 Document in Chart   Albumin, pleural or peritoneal fluid     Status: None   Collection  Time: 02/27/16 10:27 AM  Result Value Ref Range   Albumin, Fluid 2.6 g/dL   Fluid Type-FALB Peritoneal   Lactate dehydrogenase (CSF, pleural or peritoneal fluid)     Status: Abnormal   Collection Time: 02/27/16 10:27 AM  Result Value Ref Range   LD, Fluid 445 (H) 3 - 23 U/L   Fluid Type-FLDH Peritoneal   Protein, pleural or peritoneal fluid     Status: None   Collection Time: 02/27/16 10:27 AM  Result Value Ref Range   Total protein, fluid 4.4 g/dL   Fluid Type-FTP Peritoneal   Body fluid cell count with differential     Status: Abnormal   Collection Time: 02/27/16 10:27 AM  Result Value Ref Range   Fluid Type-FCT Peritoneal    Color, Fluid YELLOW (A) YELLOW   Appearance, Fluid HAZY (A) CLEAR   WBC, Fluid 1,102 (H) 0 - 1,000 cu mm   Neutrophil Count, Fluid 4 0 - 25 %   Lymphs, Fluid 59 %   Monocyte-Macrophage-Serous Fluid 37 (L) 50 - 90 %   Eos, Fluid 0 %   Other Cells, Fluid Reactive mesothelial cells noted. %  Gram stain     Status: None   Collection Time: 02/27/16 10:27 AM  Result Value Ref Range   Specimen Description FLUID PERITONEAL    Special Requests NONE    Gram Stain      RARE WBC PRESENT, PREDOMINANTLY MONONUCLEAR NO ORGANISMS SEEN    Report Status 02/27/2016 FINAL   Glucose, pleural or peritoneal fluid     Status: None   Collection Time: 02/27/16 10:27 AM  Result Value Ref Range   Glucose, Fluid 88 mg/dL   Fluid Type-FGLU Peritoneal   CBG monitoring, ED     Status: None   Collection Time: 02/27/16 11:25 AM  Result Value Ref Range   Glucose-Capillary 93 65 - 99 mg/dL  Lactate dehydrogenase     Status: Abnormal   Collection Time: 02/27/16  3:26 PM  Result Value Ref Range   LDH 209 (H) 98 - 192 U/L     Ct Abdomen Pelvis W Contrast  Result Date: 02/26/2016 CLINICAL DATA:  Right-sided abdominal pain. EXAM: CT ABDOMEN AND PELVIS WITH CONTRAST TECHNIQUE: Multidetector CT imaging of the abdomen and  pelvis was performed using the standard protocol following  bolus administration of intravenous contrast. CONTRAST:  100 mL of Isovue-300 intravenously. COMPARISON:  CT scan of Jun 16, 2003. FINDINGS: Lower chest: Mild bilateral posterior basilar subsegmental atelectasis is noted. Hepatobiliary: No gallstones are noted. Mild intrahepatic and extrahepatic biliary dilatation is noted. No focal mass or lesion is noted in the liver. The liver does appear to be slightly moved smaller with slightly nodular contours suggesting hepatic cirrhosis. Pancreas: Unremarkable. No pancreatic ductal dilatation or surrounding inflammatory changes. Spleen: Normal in size without focal abnormality. Adrenals/Urinary Tract: Adrenal glands are unremarkable. Kidneys are normal, without renal calculi, focal lesion, or hydronephrosis. Bladder is unremarkable. Stomach/Bowel: There is no evidence of bowel obstruction. Vascular/Lymphatic: No significant vascular findings are present. No enlarged abdominal or pelvic lymph nodes. Reproductive: Mild prostatic enlargement is noted with associated calcification. Other: Moderate ascites is noted. Irregular thickening is seen involving the omentum ; is uncertain if this represents inflammation or edema, or possibly carcinomatosis. Small left fat containing inguinal hernia is noted. Musculoskeletal: No acute or significant osseous findings. IMPRESSION: Mild intrahepatic and extrahepatic biliary dilatation is noted. Correlation with liver function tests is recommended to rule out obstruction. Findings are suggestive of possible hepatic cirrhosis. Moderate ascites is noted. Irregular thickening is seen involving the omentum ; it is uncertain if this represents inflammation or edema, or possibly carcinomatosis. Clinical correlation is recommended. Electronically Signed   By: Marijo Conception, M.D.   On: 02/26/2016 16:33   US Paracentesis  Result Date: 02/27/2016 INDICATION: Should with right upper quadrant abdominal pain for 2 weeks. Recent imaging shows  possible cirrhosis with moderate ascites. Request is made for diagnostic and therapeutic paracentesis. EXAM: ULTRASOUND GUIDED DIAGNOSTIC AND THERAPEUTIC PARACENTESIS MEDICATIONS: 1% lidocaine COMPLICATIONS: None immediate. PROCEDURE: Informed written consent was obtained from the patient after a discussion of the risks, benefits and alternatives to treatment. A timeout was performed prior to the initiation of the procedure. Initial ultrasound scanning demonstrates a small amount of ascites within the left lower abdominal quadrant. The left lower abdomen was prepped and draped in the usual sterile fashion. 1% lidocaine was used for local anesthesia. Following this, a 19 gauge, 7-cm, Yueh catheter was introduced. An ultrasound image was saved for documentation purposes. The paracentesis was performed. The catheter was removed and a dressing was applied. The patient tolerated the procedure well without immediate post procedural complication. FINDINGS: A total of approximately 3.7 L of yellow fluid was removed. Samples were sent to the laboratory as requested by the clinical team. IMPRESSION: Successful ultrasound-guided paracentesis yielding 3.7 liters of peritoneal fluid. Read by: Saverio Danker, PA-C Electronically Signed   By: Jerilynn Mages.  Shick M.D.   On: 02/27/2016 12:08   Dg Chest Port 1 View  Result Date: 02/26/2016 CLINICAL DATA:  Right middle flank pain. Abdominal distension with nausea, no vomiting. EXAM: PORTABLE CHEST 1 VIEW COMPARISON:  None. FINDINGS: 1514 hours. The heart size and mediastinal contours are normal. There is mild left-greater-than-right basilar atelectasis. No edema, confluent airspace opacity or significant pleural effusion. The bones appear unremarkable. IMPRESSION: Mild bibasilar atelectasis.  No acute cardiopulmonary process. Electronically Signed   By: Richardean Sale M.D.   On: 02/26/2016 15:29   US Abdomen Limited Ruq  Result Date: 02/26/2016 CLINICAL DATA:  Acute onset of right upper  quadrant abdominal pain. Initial encounter. EXAM: US ABDOMEN LIMITED - RIGHT UPPER QUADRANT COMPARISON:  CT of the abdomen and pelvis performed earlier today at 4:02 p.m. FINDINGS: Gallbladder: The gallbladder  has a diffusely thick-walled appearance. This is nonspecific in the presence of ascites. No stones are seen. No ultrasonographic Murphy's sign is elicited. Common bile duct: Diameter: 0.9 cm, raising question for distal obstruction. There is suggestion of mild debris within the mid common bile duct. Liver: No focal lesion identified. Nodular contour, compatible with hepatic cirrhosis. Mild prominence of the intrahepatic biliary ducts noted. Moderate volume ascites is seen within the abdomen. IMPRESSION: 1. Findings of hepatic cirrhosis, with moderate volume ascites in the abdomen. 2. Prominence of the common bile duct, measuring 0.9 cm, raising question for distal obstruction. Mild prominence of the intrahepatic biliary ducts. Suggestion of underlying debris within the mid common bile duct. 3. Diffusely thick-walled gallbladder appearance is nonspecific in the presence of ascites. Gallbladder otherwise unremarkable. Electronically Signed   By: Garald Balding M.D.   On: 02/26/2016 21:40    ROS:  As stated above in the HPI otherwise negative.  Blood pressure (!) 158/66, pulse 83, temperature 98.2 F (36.8 C), temperature source Oral, resp. rate 18, height 6\' 3"  (1.905 m), weight 104.3 kg (230 lb), SpO2 97 %.    PE: Gen: NAD, Alert and Oriented HEENT:  [place]/AT, EOMI Neck: Supple, no LAD Lungs: CTA Bilaterally CV: RRR without M/G/R ABM: Soft, nontender, + ascites, +BS Ext: mild edema.  Assessment/Plan: 1) Probable biliary obstruction. 2) ? Carcinomatosis. 3) Ascites.   The current SAAG calculation suggests a malignant process.  There is no overt mass causing the biliary obstruction, but further evaluation and treatment with an ERCP is appropriate.  He may also require an EUS pending the  findings of the ERCP.  Stone may still be a consideration for the biliary findings, but this cannot explain the ascites and SAAG.  Even after his paracentesis he states that his ascites is reaccumulating.    Plan: 1) ERCP tomorrow.  Possible stent placement. 2) Await cytology form the peritoneal fluid. Sonam Wandel D 02/27/2016, 4:20 PM

## 2016-02-29 ENCOUNTER — Inpatient Hospital Stay (HOSPITAL_COMMUNITY): Payer: PPO

## 2016-02-29 ENCOUNTER — Encounter (HOSPITAL_COMMUNITY): Payer: Self-pay | Admitting: Gastroenterology

## 2016-02-29 DIAGNOSIS — C240 Malignant neoplasm of extrahepatic bile duct: Secondary | ICD-10-CM | POA: Diagnosis present

## 2016-02-29 DIAGNOSIS — K831 Obstruction of bile duct: Secondary | ICD-10-CM

## 2016-02-29 LAB — COMPREHENSIVE METABOLIC PANEL
ALT: 137 U/L — AB (ref 17–63)
AST: 95 U/L — AB (ref 15–41)
Albumin: 2.6 g/dL — ABNORMAL LOW (ref 3.5–5.0)
Alkaline Phosphatase: 276 U/L — ABNORMAL HIGH (ref 38–126)
Anion gap: 10 (ref 5–15)
BILIRUBIN TOTAL: 1.8 mg/dL — AB (ref 0.3–1.2)
BUN: 18 mg/dL (ref 6–20)
CO2: 25 mmol/L (ref 22–32)
CREATININE: 1.18 mg/dL (ref 0.61–1.24)
Calcium: 8.3 mg/dL — ABNORMAL LOW (ref 8.9–10.3)
Chloride: 99 mmol/L — ABNORMAL LOW (ref 101–111)
GFR calc Af Amer: >60 mL/min (ref >60)
GFR, EST NON AFRICAN AMERICAN: 59 mL/min — AB (ref >60)
Glucose, Bld: 130 mg/dL — ABNORMAL HIGH (ref 65–99)
Potassium: 4.3 mmol/L (ref 3.5–5.1)
Sodium: 134 mmol/L — ABNORMAL LOW (ref 135–145)
TOTAL PROTEIN: 6.6 g/dL (ref 6.5–8.1)

## 2016-02-29 LAB — HEPATIC FUNCTION PANEL
ALK PHOS: 250 U/L — AB (ref 38–126)
ALT: 118 U/L — ABNORMAL HIGH (ref 17–63)
AST: 78 U/L — ABNORMAL HIGH (ref 15–41)
Albumin: 2.6 g/dL — ABNORMAL LOW (ref 3.5–5.0)
BILIRUBIN DIRECT: 0.4 mg/dL (ref 0.1–0.5)
BILIRUBIN INDIRECT: 0.6 mg/dL (ref 0.3–0.9)
TOTAL PROTEIN: 6.1 g/dL — AB (ref 6.5–8.1)
Total Bilirubin: 1 mg/dL (ref 0.3–1.2)

## 2016-02-29 MED ORDER — POLYETHYLENE GLYCOL 3350 17 G PO PACK
17.0000 g | PACK | Freq: Every day | ORAL | Status: DC
  Administered 2016-02-29: 17 g via ORAL
  Filled 2016-02-29 (×2): qty 1

## 2016-02-29 MED ORDER — GADOBENATE DIMEGLUMINE 529 MG/ML IV SOLN: 20.0000 mL | Freq: Once | INTRAVENOUS | Status: DC | PRN

## 2016-02-29 MED ORDER — LIDOCAINE HCL 1 % IJ SOLN
INTRAMUSCULAR | Status: AC
  Filled 2016-02-29: qty 20

## 2016-02-29 MED ORDER — CEFTRIAXONE SODIUM 1 G IJ SOLR
1.0000 g | INTRAMUSCULAR | Status: DC
Start: 2016-02-29 — End: 2016-03-01
  Administered 2016-02-29: 1 g via INTRAVENOUS
  Filled 2016-02-29 (×2): qty 10

## 2016-02-29 NOTE — Consult Note (Signed)
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 He was admitted on Gen. 22nd 2018 for further workup. CT of abdomen showed mild intrahepatic and extrahepatic biliary dilatation, possible liver cirrhosis, irregular thickening of the omentum, and ascites. He underwent paracentesis on general 23rd 2018, and cytology was positive for malignant adenocarcinoma. He was seen by GI service, and underwent ERCP by Dr. Benson Norway yesterday. A strict in CBD was noticed, status post stent placement. Brush cytology was positive for a typical cells, not diagnostic for malignancy. He had repeated paracentesis yesterday with 2.9L fluids removed, still feels bloated, right side abdominal pain has improved, he ambulates in the hallway.   MEDICAL HISTORY:  Past Medical History:  Diagnosis Date  . Abdominal distension 02/2016    SURGICAL HISTORY: Past Surgical History:  Procedure Laterality Date  . COSMETIC SURGERY     eyebrows  . DENTAL SURGERY    . ERCP N/A 02/28/2016   Procedure: ENDOSCOPIC RETROGRADE CHOLANGIOPANCREATOGRAPHY (ERCP);  Surgeon: Carol Ada, MD;  Location: [facility] ENDOSCOPY;  Service: Endoscopy;  Laterality: N/A;    SOCIAL HISTORY: Social History   Social History  . Marital status: Married    Spouse name: N/A  . Number of children: N/A  . Years of education: N/A   Occupational History  . Not on file.   Social History Main Topics  . Smoking status: Former Smoker   Quit date: 1965  . Smokeless tobacco: Never Used  . Alcohol use Yes     Comment: occas  . Drug use: No  . Sexual activity: Not on file   Other Topics Concern  . Not on file   Social History Narrative  . No narrative on file    FAMILY HISTORY: History reviewed. No pertinent family history.  ALLERGIES:  has No Known Allergies.  MEDICATIONS:  Current Facility-Administered Medications  Medication Dose Route Frequency Provider Last Rate Last Dose  . cefTRIAXone (ROCEPHIN) 1 g in dextrose 5 % 50 mL IVPB  1 g Intravenous Q24H Nishant Dhungel, MD   1 g at 02/29/16 1504  . enoxaparin (LOVENOX) injection 40 mg  40 mg Subcutaneous Daily Theodis Blaze, MD   Stopped at 02/28/16 [phone]  . fentaNYL (SUBLIMAZE) injection 25-50 mcg  25-50 mcg Intravenous Q4H PRN Theodis Blaze, MD   50 mcg at 03/01/16 0402  . gadobenate dimeglumine (MULTIHANCE) injection 20 mL  20 mL Intravenous Once PRN Carol Ada, MD      . ondansetron [facility]) tablet 4 mg  4 mg Oral Q6H PRN Theodis Blaze, MD       Or  . ondansetron [facility]) injection 4 mg  4 mg Intravenous Q6H PRN Theodis Blaze, MD   4 mg at 02/27/16 0751  . polyethylene glycol (MIRALAX / GLYCOLAX) packet 17 g  17 g Oral Daily Nishant Dhungel, MD   17 g at 02/29/16 1504    REVIEW OF SYSTEMS:   Constitutional: Denies fevers, chills or abnormal night sweats Eyes: Denies blurriness of vision, double vision or watery eyes Ears, nose,  mouth, throat, and face: Denies mucositis or sore throat Respiratory: Denies cough, dyspnea or wheezes Cardiovascular: Denies palpitation, chest discomfort or lower extremity swelling Gastrointestinal:  Denies nausea, heartburn or change in bowel habits Skin: Denies abnormal skin rashes Lymphatics: Denies new lymphadenopathy or easy bruising Neurological:Denies numbness, tingling or new weaknesses Behavioral/Psych: Mood is stable, no new changes  All other systems were reviewed with the patient and are negative.  PHYSICAL  EXAMINATION: ECOG PERFORMANCE STATUS: 1 - Symptomatic but completely ambulatory  Vitals:   02/29/16 2108 03/01/16 0615  BP: 137/70 125/69  Pulse: 85 67  Resp: 18 18  Temp: 98.4 F (36.9 C) 98.7 F (37.1 C)   Filed Weights   02/26/16 0940  Weight: 230 lb (104.3 kg)    GENERAL:alert, no distress and comfortable SKIN: skin color, texture, turgor are normal, no rashes or significant lesions EYES: normal, conjunctiva are pink and non-injected, sclera clear OROPHARYNX:no exudate, no erythema and lips, buccal mucosa, and tongue normal  NECK: supple, thyroid normal size, non-tender, without nodularity LYMPH:  no palpable lymphadenopathy in the cervical, axillary or inguinal LUNGS: clear to auscultation and percussion with normal breathing effort HEART: regular rate & rhythm and no murmurs and no lower extremity edema ABDOMEN:abdomen soft, distended, mild tenderness in the right upper quadrant, no organomegaly, (+) ascites, bowel sounds normal.  Musculoskeletal:no cyanosis of digits and no clubbing  PSYCH: alert & oriented x 3 with fluent speech NEURO: no focal motor/sensory deficits  LABORATORY DATA:  I have reviewed the data as listed Lab Results  Component Value Date   WBC 6.8 02/27/2016   HGB 14.0 02/27/2016   HCT 41.3 02/27/2016   MCV 99.8 02/27/2016   PLT 270 02/27/2016    Recent Labs  02/26/16 1424 02/27/16 0315 02/28/16 0834 02/29/16 0712 02/29/16 1414  NA 135 133*  --  134*  --   K 4.1 4.2  --  4.3  --   CL 99* 101  --  99*  --   CO2 27 24  --  25  --   GLUCOSE 109* 107*  --  130*  --   BUN 11 13  --  18  --   CREATININE 0.99 1.02  --  1.18  --   CALCIUM 8.5* 8.5*  --  8.3*  --   GFRNONAA >60 >60  --  59*  --   GFRAA >60 >60  --  >60  --   PROT 6.5 6.2* 5.9* 6.6 6.1*  ALBUMIN 3.0* 2.9* 2.6* 2.6* 2.6*  AST 139* 136* 163* 95* 78*  ALT 184* 176* 189* 137* 118*  ALKPHOS 327* 290* 328* 276* 250*  BILITOT 1.9* 1.8* 3.0* 1.8* 1.0  BILIDIR  --   --  1.4*  --   0.4  IBILI  --   --  1.6*  --  0.6   PATHOLOGY REPORT  Diagnosis 02/28/2016 COMMON BILE DUCT BRUSHING (SPECIMEN 1 OF 1 COLLECTED 02/28/2016) ATYPICAL DUCTAL EPITHELIAL CELLS PRESENT, SEE COMMENT.  Comment There are only a few clusters of atypical cells, which are not diagnostic for malignancy. There is no cell block material.  Diagnosis 02/27/2016 PERITONEAL/ASCITIC FLUID, ABDOMINAL (SPECIMEN 1 OF 1 COLLECTED 02-27-2016) MALIGNANT CELLS PRESENT, CONSISTENT WITH CARCINOMA. SEE COMMENT.  COMMENT: THE MALIGNANT CELLS ARE POSITIVE FOR MOC-31. THEY ARE NEGATIVE FOR CALRETININ, WT-1, CD56, AND TTF-1. THE FINDINGS ARE CONSISTENT WITH CARCINOMA, BUT THE PRIMARY SITE IS UNCLEAR. THERE IS LIKELY INSUFFICIENT TUMOR PRESENT FOR ADDITIONAL STUDIES, IF REQUESTED. DR. Jenny Reichmann PATRICK HAS  REVIEWED THE CASE AND CONCURS WITH THIS INTERPRETATION.   RADIOGRAPHIC STUDIES: I have personally reviewed the radiological images as listed and agreed with the findings in the report. Ct Abdomen Pelvis W Contrast  Result Date: 02/26/2016 CLINICAL DATA:  Right-sided abdominal pain. EXAM: CT ABDOMEN AND PELVIS WITH CONTRAST TECHNIQUE: Multidetector CT imaging of the abdomen and pelvis was performed using the standard protocol following bolus administration of intravenous contrast. CONTRAST:  100 mL of Isovue-300 intravenously. COMPARISON:  CT scan of Jun 16, 2003. FINDINGS: Lower chest: Mild bilateral posterior basilar subsegmental atelectasis is noted. Hepatobiliary: No gallstones are noted. Mild intrahepatic and extrahepatic biliary dilatation is noted. No focal mass or lesion is noted in the liver. The liver does appear to be slightly moved smaller with slightly nodular contours suggesting hepatic cirrhosis. Pancreas: Unremarkable. No pancreatic ductal dilatation or surrounding inflammatory changes. Spleen: Normal in size without focal abnormality. Adrenals/Urinary Tract: Adrenal glands are unremarkable. Kidneys are normal,  without renal calculi, focal lesion, or hydronephrosis. Bladder is unremarkable. Stomach/Bowel: There is no evidence of bowel obstruction. Vascular/Lymphatic: No significant vascular findings are present. No enlarged abdominal or pelvic lymph nodes. Reproductive: Mild prostatic enlargement is noted with associated calcification. Other: Moderate ascites is noted. Irregular thickening is seen involving the omentum ; is uncertain if this represents inflammation or edema, or possibly carcinomatosis. Small left fat containing inguinal hernia is noted. Musculoskeletal: No acute or significant osseous findings. IMPRESSION: Mild intrahepatic and extrahepatic biliary dilatation is noted. Correlation with liver function tests is recommended to rule out obstruction. Findings are suggestive of possible hepatic cirrhosis. Moderate ascites is noted. Irregular thickening is seen involving the omentum ; it is uncertain if this represents inflammation or edema, or possibly carcinomatosis. Clinical correlation is recommended. Electronically Signed   By: Marijo Conception, M.D.   On: 02/26/2016 16:33   US Paracentesis  Result Date: 02/29/2016 INDICATION: Ascites of unknown etiology. Request is made for therapeutic paracentesis. EXAM: ULTRASOUND GUIDED THERAPEUTIC PARACENTESIS MEDICATIONS: 1% lidocaine. COMPLICATIONS: None immediate. PROCEDURE: Informed written consent was obtained from the patient after a discussion of the risks, benefits and alternatives to treatment. A timeout was performed prior to the initiation of the procedure. Initial ultrasound scanning demonstrates a small amount of ascites within the left upper abdominal quadrant. The left upper abdomen was prepped and draped in the usual sterile fashion. 1% lidocaine was used for local anesthesia. Following this, a 19 gauge, 7-cm, Yueh catheter was introduced. An ultrasound image was saved for documentation purposes. The paracentesis was performed. The catheter was  removed and a dressing was applied. The patient tolerated the procedure well without immediate post procedural complication. FINDINGS: A total of approximately 2.9 L of yellow fluid was removed. IMPRESSION: Successful ultrasound-guided paracentesis yielding 2.9 liters of peritoneal fluid. Read by: Saverio Danker, PA-C Electronically Signed   By: Aletta Edouard M.D.   On: 02/29/2016 13:54   US Paracentesis  Result Date: 02/27/2016 INDICATION: Should with right upper quadrant abdominal pain for 2 weeks. Recent imaging shows possible cirrhosis with moderate ascites. Request is made for diagnostic and therapeutic paracentesis. EXAM: ULTRASOUND GUIDED DIAGNOSTIC AND THERAPEUTIC PARACENTESIS MEDICATIONS: 1% lidocaine COMPLICATIONS: None immediate. PROCEDURE: Informed written consent was obtained from the patient after a discussion of the risks, benefits and alternatives to treatment. A timeout was performed prior to the initiation of the procedure. Initial ultrasound scanning demonstrates a small amount of ascites within the left lower abdominal quadrant. The left lower abdomen was prepped and draped in the usual sterile  fashion. 1% lidocaine was used for local anesthesia. Following this, a 19 gauge, 7-cm, Yueh catheter was introduced. An ultrasound image was saved for documentation purposes. The paracentesis was performed. The catheter was removed and a dressing was applied. The patient tolerated the procedure well without immediate post procedural complication. FINDINGS: A total of approximately 3.7 L of yellow fluid was removed. Samples were sent to the laboratory as requested by the clinical team. IMPRESSION: Successful ultrasound-guided paracentesis yielding 3.7 liters of peritoneal fluid. Read by: Saverio Danker, PA-C Electronically Signed   By: Jerilynn Mages.  Shick M.D.   On: 02/27/2016 12:08   Dg Chest Port 1 View  Result Date: 02/26/2016 CLINICAL DATA:  Right middle flank pain. Abdominal distension with nausea, no  vomiting. EXAM: PORTABLE CHEST 1 VIEW COMPARISON:  None. FINDINGS: 1514 hours. The heart size and mediastinal contours are normal. There is mild left-greater-than-right basilar atelectasis. No edema, confluent airspace opacity or significant pleural effusion. The bones appear unremarkable. IMPRESSION: Mild bibasilar atelectasis.  No acute cardiopulmonary process. Electronically Signed   By: Richardean Sale M.D.   On: 02/26/2016 15:29   Dg Ercp Biliary & Pancreatic Ducts  Result Date: 02/28/2016 CLINICAL DATA:  Bile duct stone.  History of right abdominal pain. EXAM: ERCP TECHNIQUE: Multiple spot images obtained with the fluoroscopic device and submitted for interpretation post-procedure. FLUOROSCOPY TIME:  Fluoroscopy Time:  2 minutes and 42 seconds Number of Acquired Spot Images: 7 COMPARISON:  Abdominal CT 02/26/2016 FINDINGS: Cannulation and opacification of the common bile duct. Wire was advanced into the intrahepatic bile ducts. There is a focal narrowing or stricture in the proximal extrahepatic bile duct which is probably the common hepatic duct. Bile ducts are dilated proximal to this narrowing. A nonmetallic biliary stent was placed across the narrowing. IMPRESSION: Focal narrowing in the proximal extrahepatic biliary system, probably involving the common hepatic duct. Placement of biliary stent. These images were submitted for radiologic interpretation only. Please see the procedural report for the amount of contrast and the fluoroscopy time utilized. Electronically Signed   By: Markus Daft M.D.   On: 02/28/2016 15:18   Mr Abdomen Mrcp Moise Boring Contast  Result Date: 02/29/2016 CLINICAL DATA:  Abdominal pain/ distension with abnormal liver enzymes, recent ERCP showing stricture in the common hepatic duct across which a stent was placed. Hepatic morphology raises suspicion for cirrhosis. Ascites with infiltrative pattern in the omentum. EXAM: MRI ABDOMEN WITHOUT AND WITH CONTRAST (INCLUDING MRCP)  TECHNIQUE: Multiplanar multisequence MR imaging of the abdomen was performed both before and after the administration of intravenous contrast. Heavily T2-weighted images of the biliary and pancreatic ducts were obtained, and three-dimensional MRCP images were rendered by post processing. CONTRAST:  20 cc MultiHance COMPARISON:  02/26/2016 FINDINGS: Lower chest: Atelectasis in the left lower lobe with subsegmental atelectasis in the right lower lobe. Small hiatal hernia. Hepatobiliary: Mildly nodular contour the liver with morphology suggesting cirrhosis. Contracted and somewhat thick-walled gallbladder. Periportal edema. The typical 3D time-of-flight MRCP sequence was nondiagnostic due to motion artifact despite numerous attempts. Based on the other diagnostic sequences, there is minimal intrahepatic biliary dilatation extending to a approximately 1.7 cm region of accentuated enhancement within or surrounding the common hepatic duct. There is believed to be a stent traversing this region of stricture although admittedly the stent is difficult to visualize on most sequences, but its presence is suggested on image 21 of series 4. On arterial phase images there is some faintly accentuated enhancement along the margins of the gallbladder fossa.  Mild heterogeneity of enhancement in the caudate lobe but without a well-defined mass. My impression is that there is low-grade enhancement along the strictured segment of the common hepatic duct and extending into some of the intrahepatic bile ducts particularly in the left hepatic lobe and anteriorly in the right hepatic lobe, for example on images 54 through 71 of series 11404. For the most part this extends along the walls of the bile ducts rather than forming a single large mass. Mild enhancement of the distal CBD wall approaching the ampulla for example on image 94/11404. Pancreas: Mildly obscured by motion artifact. 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 No mass observed. Spleen:   Unremarkable Adrenals/Urinary Tract: Adrenal glands normal. Kidneys unremarkable. Stomach/Bowel: Unremarkable Vascular/Lymphatic: Patent celiac trunk, SMA, and single bilateral renal arteries. No pathologic adenopathy observed. Other: Upper abdominal ascites with irregular fluid infiltration of the omentum. This demonstrates abnormal reticular enhancement, and there is also thin enhancement along the peritoneal margins of the ascites. Musculoskeletal: Unremarkable IMPRESSION: 1. Along the intrahepatic bile ducts and common hepatic duct, there is abnormal enhancement of the bile duct walls. There is also some CBD enhancement near the ampulla. The appearance is abnormal and could be a manifestation of cholangitis, but given the focal stricture on prior MRCP also raises concern for cholangiocarcinoma. This is not so much masslike as potentially infiltrative along the bile duct walls. Unfortunately, despite numerous attempts, the 3D time-of-flight MRCP images could not be obtained for further spatial resolution assessment of the biliary tree. A stent is in place and there is only minimal intrahepatic biliary dilatation. 2. Ascites. Unusual nodular infiltration of the omentum with reticular associated enhancement, potentially from neoplastic involvement or inflammation of the omentum. There is subtle diffuse enhancement along the peritoneal margins of the ascites. 3. Small hiatal hernia. 4. Pancreas divisum. Electronically Signed   By: Van Clines M.D.   On: 02/29/2016 08:23   US Abdomen Limited Ruq  Result Date: 02/26/2016 CLINICAL DATA:  Acute onset of right upper quadrant abdominal pain. Initial encounter. EXAM: US ABDOMEN LIMITED - RIGHT UPPER QUADRANT COMPARISON:  CT of the abdomen and pelvis performed earlier today at 4:02 p.m. FINDINGS: Gallbladder: The gallbladder has a diffusely thick-walled appearance. This is nonspecific in the presence of ascites. No stones are seen. No ultrasonographic Murphy's  sign is elicited. Common bile duct: Diameter: 0.9 cm, raising question for distal obstruction. There is suggestion of mild debris within the mid common bile duct. Liver: No focal lesion identified. Nodular contour, compatible with hepatic cirrhosis. Mild prominence of the intrahepatic biliary ducts noted. Moderate volume ascites is seen within the abdomen. IMPRESSION: 1. Findings of hepatic cirrhosis, with moderate volume ascites in the abdomen. 2. Prominence of the common bile duct, measuring 0.9 cm, raising question for distal obstruction. Mild prominence of the intrahepatic biliary ducts. Suggestion of underlying debris within the mid common bile duct. 3. Diffusely thick-walled gallbladder appearance is nonspecific in the presence of ascites. Gallbladder otherwise unremarkable. Electronically Signed   By: Garald Balding M.D.   On: 02/26/2016 21:40   ERCP Dr. Benson Norway 02/28/2016 - A segmental biliary stricture was found. The stricture was indeterminate. This stricture was treated with stent placement. - One plastic stent was placed into the common bile duct.   ASSESSMENT & PLAN: 71 year old Caucasian male, without significant past medical history, previously very healthy and exercise regularly, presented with 2 weeks history of abdominal pain, bloating jaundice and ascites  1. 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 -He had a colonoscopy one year ago at [facility], which was negative per patient. -I recommend a PET scan to be done as outpatient -Unfortunately his cancer is incurable at this stage, and the goal  of therapy is palliative, to prolong his life and palliate his symptoms. -Although he is 18, he was previously very healthy and fit, performance status is still preserved, he is a candidate for systemic chemotherapy. I discussed the options of palliative chemotherapy regimens, including cisplatin and gemcitabine, or FOLFOX, versus single agent gemcitabine. -If it is feasible, I would like to have peritoneal nodule or omentum biopsy, to get more tissue, for genomic testing Foundation one and MSI, to see if he is a candidate for targeted therapy and or immunotherapy. -His ascites has reaccumulated quickly, it may take a few to several weeks for chemotherapy to control his disease, I recommend a Pleurx placed by IR, for symptom management. He agrees.  -I recommend a port placement by IR for chemo    Recommendations: -I spoke with IR, about omentum or peritoneal nodule biopsy, Pleurx placement, and a port placement. They will see if they can get the procedures done in the hospital, versus scheduled as outpatient -I plan to see him back in my office in a few weeks. We'll get a PET scan to be done before his visit. -Plan to start chemo in 2 weeks. I will set up his appointments    All questions were answered. The patient knows to call the clinic with any problems, questions or concerns.       Truitt Merle, MD 03/01/2016 9:12 AM

## 2016-02-29 NOTE — Procedures (Signed)
Ultrasound-guided therapeutic paracentesis performed yielding 2.9 liters of yellow colored fluid. No immediate complications.  Kishon Garriga E 1:38 PM 02/29/2016

## 2016-02-29 NOTE — Progress Notes (Signed)
Subjective: No complaints.  Objective: Vital signs in last 24 hours: Temp:  [97.7 F (36.5 C)-98 F (36.7 C)] 98 F (36.7 C) (01/25 0615) Pulse Rate:  [64-77] 72 (01/25 0615) Resp:  [14-22] 19 (01/25 0615) BP: (119-171)/(64-101) 126/64 (01/25 0615) SpO2:  [95 %-97 %] 96 % (01/25 0615) Last BM Date: 02/26/16  Intake/Output from previous day: 01/24 0701 - 01/25 0700 In: 570 [P.O.:120; I.V.:400; IV Piggyback:50] Out: [id number] [Urine:875] Intake/Output this shift: Total I/O In: 240 [P.O.:240] Out: 400 [Urine:400]  General appearance: alert and no distress GI: distended with ascites.  Lab Results:  Recent Labs  02/26/16 1424 02/27/16 0315  WBC 6.7 6.8  HGB 14.2 14.0  HCT 41.6 41.3  PLT 242 270   BMET  Recent Labs  02/26/16 1424 02/27/16 0315 02/29/16 0712  NA 135 133* 134*  K 4.1 4.2 4.3  CL 99* 101 99*  CO2 27 24 25   GLUCOSE 109* 107* 130*  BUN 11 13 18   CREATININE 0.99 1.02 1.18  CALCIUM 8.5* 8.5* 8.3*   LFT  Recent Labs  02/28/16 0834 02/29/16 0712  PROT 5.9* 6.6  ALBUMIN 2.6* 2.6*  AST 163* 95*  ALT 189* 137*  ALKPHOS 328* 276*  BILITOT 3.0* 1.8*  BILIDIR 1.4*  --   IBILI 1.6*  --    PT/INR  Recent Labs  02/26/16 1424  LABPROT 14.3  INR 1.11   Hepatitis Panel  Recent Labs  02/26/16 1439  HEPBSAG Negative  HCVAB <0.1  HEPAIGM Negative  HEPBIGM Negative   C-Diff No results for input(s): CDIFFTOX in the last 72 hours. Fecal Lactopherrin No results for input(s): FECLLACTOFRN in the last 72 hours.  Studies/Results: Dg Ercp Biliary & Pancreatic Ducts  Result Date: 02/28/2016 CLINICAL DATA:  Bile duct stone.  History of right abdominal pain. EXAM: ERCP TECHNIQUE: Multiple spot images obtained with the fluoroscopic device and submitted for interpretation post-procedure. FLUOROSCOPY TIME:  Fluoroscopy Time:  2 minutes and 42 seconds Number of Acquired Spot Images: 7 COMPARISON:  Abdominal CT 02/26/2016 FINDINGS: Cannulation and  opacification of the common bile duct. Wire was advanced into the intrahepatic bile ducts. There is a focal narrowing or stricture in the proximal extrahepatic bile duct which is probably the common hepatic duct. Bile ducts are dilated proximal to this narrowing. A nonmetallic biliary stent was placed across the narrowing. IMPRESSION: Focal narrowing in the proximal extrahepatic biliary system, probably involving the common hepatic duct. Placement of biliary stent. These images were submitted for radiologic interpretation only. Please see the procedural report for the amount of contrast and the fluoroscopy time utilized. Electronically Signed   By: Markus Daft M.D.   On: 02/28/2016 15:18   Mr Abdomen Mrcp Moise Boring Contast  Result Date: 02/29/2016 CLINICAL DATA:  Abdominal pain/ distension with abnormal liver enzymes, recent ERCP showing stricture in the common hepatic duct across which a stent was placed. Hepatic morphology raises suspicion for cirrhosis. Ascites with infiltrative pattern in the omentum. EXAM: MRI ABDOMEN WITHOUT AND WITH CONTRAST (INCLUDING MRCP) TECHNIQUE: Multiplanar multisequence MR imaging of the abdomen was performed both before and after the administration of intravenous contrast. Heavily T2-weighted images of the biliary and pancreatic ducts were obtained, and three-dimensional MRCP images were rendered by post processing. CONTRAST:  20 cc MultiHance COMPARISON:  02/26/2016 FINDINGS: Lower chest: Atelectasis in the left lower lobe with subsegmental atelectasis in the right lower lobe. Small hiatal hernia. Hepatobiliary: Mildly nodular contour the liver with morphology suggesting cirrhosis. Contracted and somewhat thick-walled  gallbladder. Periportal edema. The typical 3D time-of-flight MRCP sequence was nondiagnostic due to motion artifact despite numerous attempts. Based on the other diagnostic sequences, there is minimal intrahepatic biliary dilatation extending to a approximately 1.7 cm  region of accentuated enhancement within or surrounding the common hepatic duct. There is believed to be a stent traversing this region of stricture although admittedly the stent is difficult to visualize on most sequences, but its presence is suggested on image 21 of series 4. On arterial phase images there is some faintly accentuated enhancement along the margins of the gallbladder fossa. Mild heterogeneity of enhancement in the caudate lobe but without a well-defined mass. My impression is that there is low-grade enhancement along the strictured segment of the common hepatic duct and extending into some of the intrahepatic bile ducts particularly in the left hepatic lobe and anteriorly in the right hepatic lobe, for example on images 54 through 71 of series 11404. For the most part this extends along the walls of the bile ducts rather than forming a single large mass. Mild enhancement of the distal CBD wall approaching the ampulla for example on image [id number]. Pancreas: Mildly obscured by motion artifact. 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 No mass observed. Spleen:  Unremarkable Adrenals/Urinary Tract: Adrenal glands normal. Kidneys unremarkable. Stomach/Bowel: Unremarkable Vascular/Lymphatic: Patent celiac trunk, SMA, and single bilateral renal arteries. No pathologic adenopathy observed. Other: Upper abdominal ascites with irregular fluid infiltration of the omentum. This demonstrates abnormal reticular enhancement, and there is also thin enhancement along the peritoneal margins of the ascites. Musculoskeletal: Unremarkable IMPRESSION: 1. Along the intrahepatic bile ducts and common hepatic duct, there is abnormal enhancement of the bile duct walls. There is also some CBD enhancement near the ampulla. The appearance is abnormal and could be a manifestation of cholangitis, but given the focal stricture on prior MRCP also raises concern for cholangiocarcinoma. This is not so much masslike as potentially infiltrative  along the bile duct walls. Unfortunately, despite numerous attempts, the 3D time-of-flight MRCP images could not be obtained for further spatial resolution assessment of the biliary tree. A stent is in place and there is only minimal intrahepatic biliary dilatation. 2. Ascites. Unusual nodular infiltration of the omentum with reticular associated enhancement, potentially from neoplastic involvement or inflammation of the omentum. There is subtle diffuse enhancement along the peritoneal margins of the ascites. 3. Small hiatal hernia. 4. Pancreas divisum. Electronically Signed   By: Van Clines M.D.   On: 02/29/2016 08:23    Medications:  Scheduled: . lidocaine      . cefTRIAXone (ROCEPHIN)  IV  1 g Intravenous Q24H  . enoxaparin (LOVENOX) injection  40 mg Subcutaneous Daily  . 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   The cytology is positive for malignant cells.  The MRI reveals an intrinsic stricture in the proximal bile ducts and intrahepatic ducts consistent with a cholangiocarcinoma.  Plan: 1) Oncology consultation. 2) Pending the clinical course a metallic stent will be required.  LOS: 3 days   Brogen Duell D 02/29/2016, 1:23 PM

## 2016-02-29 NOTE — Progress Notes (Signed)
PROGRESS NOTE                                                                                                                                                                                                             Patient Demographics:    Matthew Waller, is a 71 y.o. male, DOB - 1945/11/10, [id number]  Admit date - 02/26/2016   Admitting Physician Theodis Blaze, MD  Outpatient Primary MD for the patient is [name], MD  LOS - 3  Outpatient Specialists: NONE  Chief Complaint  Patient presents with  . Abdominal Pain       Brief Narrative  71 year old male with a past medical history presented to the ED with progressive abdominal discomfort for 2 weeks duration. He had multiple episodes of diarrhea and poor by mouth intake. The diarrhea lasts for 5 days and subsided. He then started having sharp right upper quadrant pain, nonradiating and associated with nausea but no vomiting. He noticed progressive abdominal distention. No prior symptoms in the past. Reports very occasional alcohol use (1-2 times a month), denies recent local or distant travel, sick contact, family history of liver disease, IV drug use or being on any prescription medications or over-the-counter supplements. He denies any weight loss. Did report that his urine was dark. Denied any fevers or chills, chest pain, palpitations, shortness of breath, weakness or numbness in his extremities.  In the ED vitals were stable. Physical exam showed distended abdomen. Labs showed transaminitis and. CT of the abdomen and pelvis showed mild intrahepatic and extremity biliary dilatation concerning for obstruction with possible hepatic cirrhosis and moderate ascites. Also irregular thickening of the omentum with possible inflammation versus edema versus carcinomatosis.  Admitted to hospitalist service and GI consulted.      Subjective:   Has abdominal  fullness and reports being constipated.  Assessment  & Plan :    Principal Problem:   Abdominal distension With transaminitis CT abdomen  as above. high CA-19-9 and rapid reoccurrence of ascites after paracentesis indicating high suspicion for malignancy. Ascites fluid cytology pending. GI consult appreciated. 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 Ascites fluid shows high WBC (1100), with reactive mesothelial cells and high fluid LDH.. -MRCP shows abnormal enhancement of the bile duct walls along intrahepatic and common hepatic duct with enhanced CBD near the  ampulla concerning for cholangiocarcinoma given focal stricture on ERCP. Again shows ascites with nodular infiltration of the omentum concerning for neoplastic involvement. LFTs slightly improved after CBD stenting. -Narrow antibiotics to Rocephin. Continue when necessary fentanyl for pain. -Hepatitis panel , AFP, CEA  negative. Ascites fluid cytology pending.  Repeat large volume paracentesis ordered.        Code Status : Full code  Family Communication  : Wife at bedside Disposition Plan  : Pending cytology results Barriers For Discharge : Active symptoms  Consults  :  GI (Dr. Benson Norway)   Procedures  : CT abdomen pelvis Ultrasound abdomen Abdominal Paracentesis ERCP MRCP  DVT Prophylaxis  :  Lovenox -   Lab Results  Component Value Date   PLT 270 02/27/2016    Antibiotics  :   Anti-infectives    Start     Dose/Rate Route Frequency Ordered Stop   02/27/16 0300  piperacillin-tazobactam (ZOSYN) IVPB 3.375 g     3.375 g 12.5 mL/hr over 240 Minutes Intravenous Every 8 hours 02/26/16 2010     02/26/16 2015  piperacillin-tazobactam (ZOSYN) IVPB 3.375 g     3.375 g 100 mL/hr over 30 Minutes Intravenous  Once 02/26/16 2010 02/27/16 0131        Objective:   Vitals:   02/28/16 1418 02/28/16 1508 02/28/16 2054 02/29/16 0615  BP: (!) 164/86 (!) 153/82 119/66 126/64  Pulse: 64 70 72 72    Resp: (!) 21 19 19 19   Temp:  97.7 F (36.5 C) 97.9 F (36.6 C) 98 F (36.7 C)  TempSrc:  Oral Oral Oral  SpO2: 95% 96% 96% 96%  Weight:      Height:        Wt Readings from Last 3 Encounters:  02/26/16 104.3 kg (230 lb)     Intake/Output Summary (Last 24 hours) at 02/29/16 1149 Last data filed at 02/29/16 1047  Gross per 24 hour  Intake              810 ml  Output             1100 ml  Net             -290 ml     Physical Exam  Gen: not in distress HEENT:  No icterus, moist mucosa, supple neck Chest: clear b/l, no added sounds CVS: N S1&S2, no murmurs,  [id number] abdomen with ascites , RUQ tenderness resolved Musculoskeletal: warm, no edema    Data Review:    CBC  Recent Labs Lab 02/26/16 1424 02/27/16 0315  WBC 6.7 6.8  HGB 14.2 14.0  HCT 41.6 41.3  PLT 242 270  MCV 100.2* 99.8  MCH 34.2* 33.8  MCHC 34.1 33.9  RDW 14.1 14.0  LYMPHSABS 1.7  --   MONOABS 0.8  --   EOSABS 0.1  --   BASOSABS 0.0  --     Chemistries   Recent Labs Lab 02/26/16 1424 02/27/16 0315 02/28/16 0834 02/29/16 0712  NA 135 133*  --  134*  K 4.1 4.2  --  4.3  CL 99* 101  --  99*  CO2 27 24  --  25  GLUCOSE 109* 107*  --  130*  BUN 11 13  --  18  CREATININE 0.99 1.02  --  1.18  CALCIUM 8.5* 8.5*  --  8.3*  AST 139* 136* 163* 95*  ALT 184* 176* 189* 137*  ALKPHOS 327* 290* 328* 276*  BILITOT 1.9* 1.8* 3.0*  1.8*   ------------------------------------------------------------------------------------------------------------------ No results for input(s): CHOL, HDL, LDLCALC, TRIG, CHOLHDL, LDLDIRECT in the last 72 hours.  No results found for: HGBA1C ------------------------------------------------------------------------------------------------------------------ No results for input(s): TSH, T4TOTAL, T3FREE, THYROIDAB in the last 72 hours.  Invalid input(s):  FREET3 ------------------------------------------------------------------------------------------------------------------ No results for input(s): VITAMINB12, FOLATE, FERRITIN, TIBC, IRON, RETICCTPCT in the last 72 hours.  Coagulation profile  Recent Labs Lab 02/26/16 1424  INR 1.11    No results for input(s): DDIMER in the last 72 hours.  Cardiac Enzymes No results for input(s): CKMB, TROPONINI, MYOGLOBIN in the last 168 hours.  Invalid input(s): CK ------------------------------------------------------------------------------------------------------------------ No results found for: BNP  Inpatient Medications  Scheduled Meds: . enoxaparin (LOVENOX) injection  40 mg Subcutaneous Daily  . piperacillin-tazobactam (ZOSYN)  IV  3.375 g Intravenous Q8H  . polyethylene glycol  17 g Oral Daily   Continuous Infusions:  PRN Meds:.fentaNYL (SUBLIMAZE) injection, gadobenate dimeglumine, ondansetron **OR** ondansetron (ZOFRAN) IV  Micro Results Recent Results (from the past 240 hour(s))  Culture, Urine     Status: None   Collection Time: 02/27/16  3:16 AM  Result Value Ref Range Status   Specimen Description URINE, CLEAN CATCH  Final   Special Requests NONE  Final   Culture NO GROWTH  Final   Report Status 02/28/2016 FINAL  Final  Gram stain     Status: None   Collection Time: 02/27/16 10:27 AM  Result Value Ref Range Status   Specimen Description FLUID PERITONEAL  Final   Special Requests NONE  Final   Gram Stain   Final    RARE WBC PRESENT, PREDOMINANTLY MONONUCLEAR NO ORGANISMS SEEN    Report Status 02/27/2016 FINAL  Final  Culture, body fluid-bottle     Status: None (Preliminary result)   Collection Time: 02/27/16 10:27 AM  Result Value Ref Range Status   Specimen Description PERITONEAL  Final   Special Requests NONE  Final   Culture NO GROWTH 1 DAY  Final   Report Status PENDING  Incomplete    Radiology Reports Ct Abdomen Pelvis W Contrast  Result Date:  02/26/2016 CLINICAL DATA:  Right-sided abdominal pain. EXAM: CT ABDOMEN AND PELVIS WITH CONTRAST TECHNIQUE: Multidetector CT imaging of the abdomen and pelvis was performed using the standard protocol following bolus administration of intravenous contrast. CONTRAST:  100 mL of Isovue-300 intravenously. COMPARISON:  CT scan of Jun 16, 2003. FINDINGS: Lower chest: Mild bilateral posterior basilar subsegmental atelectasis is noted. Hepatobiliary: No gallstones are noted. Mild intrahepatic and extrahepatic biliary dilatation is noted. No focal mass or lesion is noted in the liver. The liver does appear to be slightly moved smaller with slightly nodular contours suggesting hepatic cirrhosis. Pancreas: Unremarkable. No pancreatic ductal dilatation or surrounding inflammatory changes. Spleen: Normal in size without focal abnormality. Adrenals/Urinary Tract: Adrenal glands are unremarkable. Kidneys are normal, without renal calculi, focal lesion, or hydronephrosis. Bladder is unremarkable. Stomach/Bowel: There is no evidence of bowel obstruction. Vascular/Lymphatic: No significant vascular findings are present. No enlarged abdominal or pelvic lymph nodes. Reproductive: Mild prostatic enlargement is noted with associated calcification. Other: Moderate ascites is noted. Irregular thickening is seen involving the omentum ; is uncertain if this represents inflammation or edema, or possibly carcinomatosis. Small left fat containing inguinal hernia is noted. Musculoskeletal: No acute or significant osseous findings. IMPRESSION: Mild intrahepatic and extrahepatic biliary dilatation is noted. Correlation with liver function tests is recommended to rule out obstruction. Findings are suggestive of possible hepatic cirrhosis. Moderate ascites is noted. Irregular thickening is seen involving  the omentum ; it is uncertain if this represents inflammation or edema, or possibly carcinomatosis. Clinical correlation is recommended.  Electronically Signed   By: Marijo Conception, M.D.   On: 02/26/2016 16:33   US Paracentesis  Result Date: 02/27/2016 INDICATION: Should with right upper quadrant abdominal pain for 2 weeks. Recent imaging shows possible cirrhosis with moderate ascites. Request is made for diagnostic and therapeutic paracentesis. EXAM: ULTRASOUND GUIDED DIAGNOSTIC AND THERAPEUTIC PARACENTESIS MEDICATIONS: 1% lidocaine COMPLICATIONS: None immediate. PROCEDURE: Informed written consent was obtained from the patient after a discussion of the risks, benefits and alternatives to treatment. A timeout was performed prior to the initiation of the procedure. Initial ultrasound scanning demonstrates a small amount of ascites within the left lower abdominal quadrant. The left lower abdomen was prepped and draped in the usual sterile fashion. 1% lidocaine was used for local anesthesia. Following this, a 19 gauge, 7-cm, Yueh catheter was introduced. An ultrasound image was saved for documentation purposes. The paracentesis was performed. The catheter was removed and a dressing was applied. The patient tolerated the procedure well without immediate post procedural complication. FINDINGS: A total of approximately 3.7 L of yellow fluid was removed. Samples were sent to the laboratory as requested by the clinical team. IMPRESSION: Successful ultrasound-guided paracentesis yielding 3.7 liters of peritoneal fluid. Read by: Saverio Danker, PA-C Electronically Signed   By: Jerilynn Mages.  Shick M.D.   On: 02/27/2016 12:08   Dg Chest Port 1 View  Result Date: 02/26/2016 CLINICAL DATA:  Right middle flank pain. Abdominal distension with nausea, no vomiting. EXAM: PORTABLE CHEST 1 VIEW COMPARISON:  None. FINDINGS: 1514 hours. The heart size and mediastinal contours are normal. There is mild left-greater-than-right basilar atelectasis. No edema, confluent airspace opacity or significant pleural effusion. The bones appear unremarkable. IMPRESSION: Mild bibasilar  atelectasis.  No acute cardiopulmonary process. Electronically Signed   By: Richardean Sale M.D.   On: 02/26/2016 15:29   Dg Ercp Biliary & Pancreatic Ducts  Result Date: 02/28/2016 CLINICAL DATA:  Bile duct stone.  History of right abdominal pain. EXAM: ERCP TECHNIQUE: Multiple spot images obtained with the fluoroscopic device and submitted for interpretation post-procedure. FLUOROSCOPY TIME:  Fluoroscopy Time:  2 minutes and 42 seconds Number of Acquired Spot Images: 7 COMPARISON:  Abdominal CT 02/26/2016 FINDINGS: Cannulation and opacification of the common bile duct. Wire was advanced into the intrahepatic bile ducts. There is a focal narrowing or stricture in the proximal extrahepatic bile duct which is probably the common hepatic duct. Bile ducts are dilated proximal to this narrowing. A nonmetallic biliary stent was placed across the narrowing. IMPRESSION: Focal narrowing in the proximal extrahepatic biliary system, probably involving the common hepatic duct. Placement of biliary stent. These images were submitted for radiologic interpretation only. Please see the procedural report for the amount of contrast and the fluoroscopy time utilized. Electronically Signed   By: Markus Daft M.D.   On: 02/28/2016 15:18   Mr Abdomen Mrcp Moise Boring Contast  Result Date: 02/29/2016 CLINICAL DATA:  Abdominal pain/ distension with abnormal liver enzymes, recent ERCP showing stricture in the common hepatic duct across which a stent was placed. Hepatic morphology raises suspicion for cirrhosis. Ascites with infiltrative pattern in the omentum. EXAM: MRI ABDOMEN WITHOUT AND WITH CONTRAST (INCLUDING MRCP) TECHNIQUE: Multiplanar multisequence MR imaging of the abdomen was performed both before and after the administration of intravenous contrast. Heavily T2-weighted images of the biliary and pancreatic ducts were obtained, and three-dimensional MRCP images were rendered by post processing.  CONTRAST:  20 cc MultiHance  COMPARISON:  02/26/2016 FINDINGS: Lower chest: Atelectasis in the left lower lobe with subsegmental atelectasis in the right lower lobe. Small hiatal hernia. Hepatobiliary: Mildly nodular contour the liver with morphology suggesting cirrhosis. Contracted and somewhat thick-walled gallbladder. Periportal edema. The typical 3D time-of-flight MRCP sequence was nondiagnostic due to motion artifact despite numerous attempts. Based on the other diagnostic sequences, there is minimal intrahepatic biliary dilatation extending to a approximately 1.7 cm region of accentuated enhancement within or surrounding the common hepatic duct. There is believed to be a stent traversing this region of stricture although admittedly the stent is difficult to visualize on most sequences, but its presence is suggested on image 21 of series 4. On arterial phase images there is some faintly accentuated enhancement along the margins of the gallbladder fossa. Mild heterogeneity of enhancement in the caudate lobe but without a well-defined mass. My impression is that there is low-grade enhancement along the strictured segment of the common hepatic duct and extending into some of the intrahepatic bile ducts particularly in the left hepatic lobe and anteriorly in the right hepatic lobe, for example on images 54 through 71 of series 11404. For the most part this extends along the walls of the bile ducts rather than forming a single large mass. Mild enhancement of the distal CBD wall approaching the ampulla for example on image 94/11404. Pancreas: Mildly obscured by motion artifact. 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 No mass observed. Spleen:  Unremarkable Adrenals/Urinary Tract: Adrenal glands normal. Kidneys unremarkable. Stomach/Bowel: Unremarkable Vascular/Lymphatic: Patent celiac trunk, SMA, and single bilateral renal arteries. No pathologic adenopathy observed. Other: Upper abdominal ascites with irregular fluid infiltration of the omentum. This  demonstrates abnormal reticular enhancement, and there is also thin enhancement along the peritoneal margins of the ascites. Musculoskeletal: Unremarkable IMPRESSION: 1. Along the intrahepatic bile ducts and common hepatic duct, there is abnormal enhancement of the bile duct walls. There is also some CBD enhancement near the ampulla. The appearance is abnormal and could be a manifestation of cholangitis, but given the focal stricture on prior MRCP also raises concern for cholangiocarcinoma. This is not so much masslike as potentially infiltrative along the bile duct walls. Unfortunately, despite numerous attempts, the 3D time-of-flight MRCP images could not be obtained for further spatial resolution assessment of the biliary tree. A stent is in place and there is only minimal intrahepatic biliary dilatation. 2. Ascites. Unusual nodular infiltration of the omentum with reticular associated enhancement, potentially from neoplastic involvement or inflammation of the omentum. There is subtle diffuse enhancement along the peritoneal margins of the ascites. 3. Small hiatal hernia. 4. Pancreas divisum. Electronically Signed   By: Van Clines M.D.   On: 02/29/2016 08:23   US Abdomen Limited Ruq  Result Date: 02/26/2016 CLINICAL DATA:  Acute onset of right upper quadrant abdominal pain. Initial encounter. EXAM: US ABDOMEN LIMITED - RIGHT UPPER QUADRANT COMPARISON:  CT of the abdomen and pelvis performed earlier today at 4:02 p.m. FINDINGS: Gallbladder: The gallbladder has a diffusely thick-walled appearance. This is nonspecific in the presence of ascites. No stones are seen. No ultrasonographic Murphy's sign is elicited. Common bile duct: Diameter: 0.9 cm, raising question for distal obstruction. There is suggestion of mild debris within the mid common bile duct. Liver: No focal lesion identified. Nodular contour, compatible with hepatic cirrhosis. Mild prominence of the intrahepatic biliary ducts noted. Moderate  volume ascites is seen within the abdomen. IMPRESSION: 1. Findings of hepatic cirrhosis, with moderate volume ascites in the  abdomen. 2. Prominence of the common bile duct, measuring 0.9 cm, raising question for distal obstruction. Mild prominence of the intrahepatic biliary ducts. Suggestion of underlying debris within the mid common bile duct. 3. Diffusely thick-walled gallbladder appearance is nonspecific in the presence of ascites. Gallbladder otherwise unremarkable. Electronically Signed   By: Garald Balding M.D.   On: 02/26/2016 21:40    Time Spent in minutes  25   Louellen Molder M.D on 02/29/2016 at 11:49 AM  Between 7am to 7pm - Pager - [phone]  After 7pm go to [email or web address] - password [facility]  [facility] -  Office  [phone]

## 2016-03-01 ENCOUNTER — Encounter (HOSPITAL_COMMUNITY): Payer: Self-pay | Admitting: General Surgery

## 2016-03-01 ENCOUNTER — Inpatient Hospital Stay (HOSPITAL_COMMUNITY): Payer: PPO

## 2016-03-01 ENCOUNTER — Other Ambulatory Visit: Payer: Self-pay | Admitting: Hematology

## 2016-03-01 DIAGNOSIS — C240 Malignant neoplasm of extrahepatic bile duct: Secondary | ICD-10-CM

## 2016-03-01 DIAGNOSIS — K831 Obstruction of bile duct: Secondary | ICD-10-CM

## 2016-03-01 DIAGNOSIS — R180 Malignant ascites: Secondary | ICD-10-CM

## 2016-03-01 DIAGNOSIS — C801 Malignant (primary) neoplasm, unspecified: Secondary | ICD-10-CM

## 2016-03-01 DIAGNOSIS — C786 Secondary malignant neoplasm of retroperitoneum and peritoneum: Secondary | ICD-10-CM

## 2016-03-01 MED ORDER — MIDAZOLAM HCL 2 MG/2ML IJ SOLN
INTRAMUSCULAR | Status: AC
  Filled 2016-03-01: qty 2

## 2016-03-01 MED ORDER — OXYCODONE HCL 5 MG PO TABS: 5.0000 mg | ORAL_TABLET | Freq: Four times a day (QID) | ORAL | 0 refills | Status: DC | PRN

## 2016-03-01 MED ORDER — ZOLPIDEM TARTRATE 5 MG PO TABS: 5.0000 mg | ORAL_TABLET | Freq: Every evening | ORAL | 0 refills | Status: DC | PRN

## 2016-03-01 MED ORDER — FENTANYL CITRATE (PF) 100 MCG/2ML IJ SOLN
INTRAMUSCULAR | Status: AC
  Filled 2016-03-01: qty 2

## 2016-03-01 MED ORDER — LIDOCAINE HCL 1 % IJ SOLN
INTRAMUSCULAR | Status: AC
  Filled 2016-03-01: qty 20

## 2016-03-01 MED ORDER — MIDAZOLAM HCL 2 MG/2ML IJ SOLN
INTRAMUSCULAR | Status: AC | PRN
  Administered 2016-03-01: 1 mg via INTRAVENOUS

## 2016-03-01 MED ORDER — POLYETHYLENE GLYCOL 3350 17 G PO PACK: 17.0000 g | PACK | Freq: Every day | ORAL | 0 refills | Status: DC | PRN

## 2016-03-01 MED ORDER — FENTANYL CITRATE (PF) 100 MCG/2ML IJ SOLN
INTRAMUSCULAR | Status: AC | PRN
  Administered 2016-03-01: 50 ug via INTRAVENOUS

## 2016-03-01 MED ORDER — ENOXAPARIN SODIUM 40 MG/0.4ML ~~LOC~~ SOLN: 40.0000 mg | Freq: Every day | SUBCUTANEOUS | Status: DC

## 2016-03-01 NOTE — Sedation Documentation (Signed)
Patient denies pain and is resting comfortably.  

## 2016-03-01 NOTE — Discharge Summary (Addendum)
Physician Discharge Summary  Matthew Waller [id number] DOB: 09/16/1945 DOA: 02/26/2016  PCP: [name], MD  Admit date: 02/26/2016 Discharge date: 03/01/2016  Admitted From: home Disposition:  home  Recommendations for Outpatient Follow-up:  1. Follow up with Dr Burr Medico in 2 weeks. Follow omental biopsy result as outpt.  Home Health:None Equipment/Devices: None  Discharge Condition: Fair CODE STATUS: Full code Diet recommendation: Regular    Discharge Diagnoses:  Principal Problem:   Ascites, malignant   Active Problems:   Abdominal pain   Transaminitis   Abdominal distension   Dysuria   Primary cholangiocarcinoma of bile duct (HCC)  Brief narrative/history of present illness 71 year old male with a past medical history presented to the ED with progressive abdominal discomfort for 2 weeks duration. He had multiple episodes of diarrhea and poor by mouth intake. The diarrhea lasts for 5 days and subsided. He then started having sharp right upper quadrant pain, nonradiating and associated with nausea but no vomiting. He noticed progressive abdominal distention. No prior symptoms in the past. Reports very occasional alcohol use (1-2 times a month), denies recent local or distant travel, sick contact, family history of liver disease, IV drug use or being on any prescription medications or over-the-counter supplements. He denies any weight loss. Did report that his urine was dark. Denied any fevers or chills, chest pain, palpitations, shortness of breath, weakness or numbness in his extremities.  In the ED vitals were stable. Physical exam showed distended abdomen. Labs showed transaminitis and. CT of the abdomen and pelvis showed mild intrahepatic and extremity biliary dilatation concerning for obstruction with possible hepatic cirrhosis and moderate ascites. Also irregular thickening of the omentum with possible inflammation versus edema versus carcinomatosis.  Hospital  course  Principal Problem:   Abdominal distension With transaminitis -CT abdomen  as above. high CA-19-9 and rapid reoccurrence of ascites after paracentesis indicating high suspicion for malignancy. Ascites fluid cytology suggestive of cancer cells without specific cell types.  -GI consult appreciated. ERCP done showing CBD stricture that was stented. Biopsy showing atypical cells. -MRCP shows abnormal enhancement of the bile duct walls along intrahepatic and common hepatic duct with enhanced CBD near the ampulla concerning for cholangiocarcinoma given focal stricture on ERCP. Again shows ascites with nodular infiltration of the omentum concerning for neoplastic involvement. -LFTs slightly improved after CBD stenting. -Discontinue antibiotics.  -Hepatitis panel , AFP, CEA  negative.   -Patient had repeat Paracentesis (2.7 L fluid removed) on 1/25.  Oncology consult appreciated. Based on  ERCP, MRCP finding and elevated CA 19-9,  primary cholangiocarcinoma is of high concern. -Recommended Port-A-Cath and Pleurx catheter placement for recurrent ascites (discussed with IR who recommended that it was difficult for them to drain enough ascites fluid and since he just had his paracentesis done yesterday he may not have enough fluid buildup he had. Recommend that patient have enough amount of ascites fluid buildup to have a Pleurx catheter placed in.).  IR will schedule outpatient Port-A-Cath placement once order placed in after discharge.  Dr. Burr Medico plans on PET scan as outpatient. Recommends that his cancer is incurable and the wound would be palliative. Plan on systemic chemotherapy as outpatient in 2 weeks.  Prescribed pain medication and Ambien for sleep.  Patient can be discharged home with outpatient oncology follow-up.  Family Communication  : Wife at bedside Disposition Plan  :  home  Consults  :  GI (Dr. Benson Norway)  Oncology (Dr. Burr Medico) IR  Procedures  : CT abdomen  pelvis Ultrasound abdomen  Abdominal Paracentesis x 2 ERCP MRCP Omental biopsy  Discharge Instructions   Allergies as of 03/01/2016   No Known Allergies     Medication List    TAKE these medications   Fish Oil 1000 MG Caps Take 1,000 mg by mouth daily.   GENTEAL OP Place 1 drop into both eyes daily.   GENTEAL 0.25-0.3 % Gel Generic drug:  Carboxymethylcell-Hypromellose Place 1 drop into both eyes at bedtime.   Ginger Root 550 MG Caps Take 550 mg by mouth daily.   GLUCOSAMINE-CHONDROITIN PO Take 2 capsules by mouth daily.   ibuprofen 200 MG tablet Commonly known as:  ADVIL,MOTRIN Take 400 mg by mouth every 6 (six) hours as needed (pain).   loratadine 10 MG tablet Commonly known as:  CLARITIN Take 10 mg by mouth daily.   OVER THE COUNTER MEDICATION Take 1 capsule by mouth daily. Prostate Health   oxyCODONE 5 MG immediate release tablet Commonly known as:  ROXICODONE Take 1 tablet (5 mg total) by mouth every 6 (six) hours as needed for severe pain.   polyethylene glycol packet Commonly known as:  MIRALAX / GLYCOLAX Take 17 g by mouth daily as needed.   zolpidem 5 MG tablet Commonly known as:  AMBIEN Take 1 tablet (5 mg total) by mouth at bedtime as needed for sleep.      Follow-up Information    Truitt Merle, MD Follow up in 2 week(s).   Specialties:  Hematology, Oncology Contact information: [place] [address] [phone]          No Known Allergies    Procedures/Studies: Ct Abdomen Pelvis W Contrast  Result Date: 02/26/2016 CLINICAL DATA:  Right-sided abdominal pain. EXAM: CT ABDOMEN AND PELVIS WITH CONTRAST TECHNIQUE: Multidetector CT imaging of the abdomen and pelvis was performed using the standard protocol following bolus administration of intravenous contrast. CONTRAST:  100 mL of Isovue-300 intravenously. COMPARISON:  CT scan of Jun 16, 2003. FINDINGS: Lower chest: Mild bilateral posterior basilar subsegmental atelectasis  is noted. Hepatobiliary: No gallstones are noted. Mild intrahepatic and extrahepatic biliary dilatation is noted. No focal mass or lesion is noted in the liver. The liver does appear to be slightly moved smaller with slightly nodular contours suggesting hepatic cirrhosis. Pancreas: Unremarkable. No pancreatic ductal dilatation or surrounding inflammatory changes. Spleen: Normal in size without focal abnormality. Adrenals/Urinary Tract: Adrenal glands are unremarkable. Kidneys are normal, without renal calculi, focal lesion, or hydronephrosis. Bladder is unremarkable. Stomach/Bowel: There is no evidence of bowel obstruction. Vascular/Lymphatic: No significant vascular findings are present. No enlarged abdominal or pelvic lymph nodes. Reproductive: Mild prostatic enlargement is noted with associated calcification. Other: Moderate ascites is noted. Irregular thickening is seen involving the omentum ; is uncertain if this represents inflammation or edema, or possibly carcinomatosis. Small left fat containing inguinal hernia is noted. Musculoskeletal: No acute or significant osseous findings. IMPRESSION: Mild intrahepatic and extrahepatic biliary dilatation is noted. Correlation with liver function tests is recommended to rule out obstruction. Findings are suggestive of possible hepatic cirrhosis. Moderate ascites is noted. Irregular thickening is seen involving the omentum ; it is uncertain if this represents inflammation or edema, or possibly carcinomatosis. Clinical correlation is recommended. Electronically Signed   By: Marijo Conception, M.D.   On: 02/26/2016 16:33   US Paracentesis  Result Date: 02/29/2016 INDICATION: Ascites of unknown etiology. Request is made for therapeutic paracentesis. EXAM: ULTRASOUND GUIDED THERAPEUTIC PARACENTESIS MEDICATIONS: 1% lidocaine. COMPLICATIONS: None immediate. PROCEDURE: Informed written consent was obtained from  the patient after a discussion of the risks, benefits and  alternatives to treatment. A timeout was performed prior to the initiation of the procedure. Initial ultrasound scanning demonstrates a small amount of ascites within the left upper abdominal quadrant. The left upper abdomen was prepped and draped in the usual sterile fashion. 1% lidocaine was used for local anesthesia. Following this, a 19 gauge, 7-cm, Yueh catheter was introduced. An ultrasound image was saved for documentation purposes. The paracentesis was performed. The catheter was removed and a dressing was applied. The patient tolerated the procedure well without immediate post procedural complication. FINDINGS: A total of approximately 2.9 L of yellow fluid was removed. IMPRESSION: Successful ultrasound-guided paracentesis yielding 2.9 liters of peritoneal fluid. Read by: Saverio Danker, PA-C Electronically Signed   By: Aletta Edouard M.D.   On: 02/29/2016 13:54   US Paracentesis  Result Date: 02/27/2016 INDICATION: Should with right upper quadrant abdominal pain for 2 weeks. Recent imaging shows possible cirrhosis with moderate ascites. Request is made for diagnostic and therapeutic paracentesis. EXAM: ULTRASOUND GUIDED DIAGNOSTIC AND THERAPEUTIC PARACENTESIS MEDICATIONS: 1% lidocaine COMPLICATIONS: None immediate. PROCEDURE: Informed written consent was obtained from the patient after a discussion of the risks, benefits and alternatives to treatment. A timeout was performed prior to the initiation of the procedure. Initial ultrasound scanning demonstrates a small amount of ascites within the left lower abdominal quadrant. The left lower abdomen was prepped and draped in the usual sterile fashion. 1% lidocaine was used for local anesthesia. Following this, a 19 gauge, 7-cm, Yueh catheter was introduced. An ultrasound image was saved for documentation purposes. The paracentesis was performed. The catheter was removed and a dressing was applied. The patient tolerated the procedure well without immediate  post procedural complication. FINDINGS: A total of approximately 3.7 L of yellow fluid was removed. Samples were sent to the laboratory as requested by the clinical team. IMPRESSION: Successful ultrasound-guided paracentesis yielding 3.7 liters of peritoneal fluid. Read by: Saverio Danker, PA-C Electronically Signed   By: Jerilynn Mages.  Shick M.D.   On: 02/27/2016 12:08   Dg Chest Port 1 View  Result Date: 02/26/2016 CLINICAL DATA:  Right middle flank pain. Abdominal distension with nausea, no vomiting. EXAM: PORTABLE CHEST 1 VIEW COMPARISON:  None. FINDINGS: 1514 hours. The heart size and mediastinal contours are normal. There is mild left-greater-than-right basilar atelectasis. No edema, confluent airspace opacity or significant pleural effusion. The bones appear unremarkable. IMPRESSION: Mild bibasilar atelectasis.  No acute cardiopulmonary process. Electronically Signed   By: Richardean Sale M.D.   On: 02/26/2016 15:29   Dg Ercp Biliary & Pancreatic Ducts  Result Date: 02/28/2016 CLINICAL DATA:  Bile duct stone.  History of right abdominal pain. EXAM: ERCP TECHNIQUE: Multiple spot images obtained with the fluoroscopic device and submitted for interpretation post-procedure. FLUOROSCOPY TIME:  Fluoroscopy Time:  2 minutes and 42 seconds Number of Acquired Spot Images: 7 COMPARISON:  Abdominal CT 02/26/2016 FINDINGS: Cannulation and opacification of the common bile duct. Wire was advanced into the intrahepatic bile ducts. There is a focal narrowing or stricture in the proximal extrahepatic bile duct which is probably the common hepatic duct. Bile ducts are dilated proximal to this narrowing. A nonmetallic biliary stent was placed across the narrowing. IMPRESSION: Focal narrowing in the proximal extrahepatic biliary system, probably involving the common hepatic duct. Placement of biliary stent. These images were submitted for radiologic interpretation only. Please see the procedural report for the amount of contrast and  the fluoroscopy time utilized. Electronically Signed  By: Markus Daft M.D.   On: 02/28/2016 15:18   Mr Abdomen Mrcp Moise Boring Contast  Result Date: 02/29/2016 CLINICAL DATA:  Abdominal pain/ distension with abnormal liver enzymes, recent ERCP showing stricture in the common hepatic duct across which a stent was placed. Hepatic morphology raises suspicion for cirrhosis. Ascites with infiltrative pattern in the omentum. EXAM: MRI ABDOMEN WITHOUT AND WITH CONTRAST (INCLUDING MRCP) TECHNIQUE: Multiplanar multisequence MR imaging of the abdomen was performed both before and after the administration of intravenous contrast. Heavily T2-weighted images of the biliary and pancreatic ducts were obtained, and three-dimensional MRCP images were rendered by post processing. CONTRAST:  20 cc MultiHance COMPARISON:  02/26/2016 FINDINGS: Lower chest: Atelectasis in the left lower lobe with subsegmental atelectasis in the right lower lobe. Small hiatal hernia. Hepatobiliary: Mildly nodular contour the liver with morphology suggesting cirrhosis. Contracted and somewhat thick-walled gallbladder. Periportal edema. The typical 3D time-of-flight MRCP sequence was nondiagnostic due to motion artifact despite numerous attempts. Based on the other diagnostic sequences, there is minimal intrahepatic biliary dilatation extending to a approximately 1.7 cm region of accentuated enhancement within or surrounding the common hepatic duct. There is believed to be a stent traversing this region of stricture although admittedly the stent is difficult to visualize on most sequences, but its presence is suggested on image 21 of series 4. On arterial phase images there is some faintly accentuated enhancement along the margins of the gallbladder fossa. Mild heterogeneity of enhancement in the caudate lobe but without a well-defined mass. My impression is that there is low-grade enhancement along the strictured segment of the common hepatic duct and  extending into some of the intrahepatic bile ducts particularly in the left hepatic lobe and anteriorly in the right hepatic lobe, for example on images 54 through 71 of series 11404. For the most part this extends along the walls of the bile ducts rather than forming a single large mass. Mild enhancement of the distal CBD wall approaching the ampulla for example on image 94/11404. Pancreas: Mildly obscured by motion artifact. 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 No mass observed. Spleen:  Unremarkable Adrenals/Urinary Tract: Adrenal glands normal. Kidneys unremarkable. Stomach/Bowel: Unremarkable Vascular/Lymphatic: Patent celiac trunk, SMA, and single bilateral renal arteries. No pathologic adenopathy observed. Other: Upper abdominal ascites with irregular fluid infiltration of the omentum. This demonstrates abnormal reticular enhancement, and there is also thin enhancement along the peritoneal margins of the ascites. Musculoskeletal: Unremarkable IMPRESSION: 1. Along the intrahepatic bile ducts and common hepatic duct, there is abnormal enhancement of the bile duct walls. There is also some CBD enhancement near the ampulla. The appearance is abnormal and could be a manifestation of cholangitis, but given the focal stricture on prior MRCP also raises concern for cholangiocarcinoma. This is not so much masslike as potentially infiltrative along the bile duct walls. Unfortunately, despite numerous attempts, the 3D time-of-flight MRCP images could not be obtained for further spatial resolution assessment of the biliary tree. A stent is in place and there is only minimal intrahepatic biliary dilatation. 2. Ascites. Unusual nodular infiltration of the omentum with reticular associated enhancement, potentially from neoplastic involvement or inflammation of the omentum. There is subtle diffuse enhancement along the peritoneal margins of the ascites. 3. Small hiatal hernia. 4. Pancreas divisum. Electronically Signed   By:  Van Clines M.D.   On: 02/29/2016 08:23   US Abdomen Limited Ruq  Result Date: 02/26/2016 CLINICAL DATA:  Acute onset of right upper quadrant abdominal pain. Initial encounter. EXAM: US ABDOMEN LIMITED -  RIGHT UPPER QUADRANT COMPARISON:  CT of the abdomen and pelvis performed earlier today at 4:02 p.m. FINDINGS: Gallbladder: The gallbladder has a diffusely thick-walled appearance. This is nonspecific in the presence of ascites. No stones are seen. No ultrasonographic Murphy's sign is elicited. Common bile duct: Diameter: 0.9 cm, raising question for distal obstruction. There is suggestion of mild debris within the mid common bile duct. Liver: No focal lesion identified. Nodular contour, compatible with hepatic cirrhosis. Mild prominence of the intrahepatic biliary ducts noted. Moderate volume ascites is seen within the abdomen. IMPRESSION: 1. Findings of hepatic cirrhosis, with moderate volume ascites in the abdomen. 2. Prominence of the common bile duct, measuring 0.9 cm, raising question for distal obstruction. Mild prominence of the intrahepatic biliary ducts. Suggestion of underlying debris within the mid common bile duct. 3. Diffusely thick-walled gallbladder appearance is nonspecific in the presence of ascites. Gallbladder otherwise unremarkable. Electronically Signed   By: Garald Balding M.D.   On: 02/26/2016 21:40      Subjective: Abdominal distention better after paracentesis yesterday.  Discharge Exam: Vitals:   03/01/16 1250 03/01/16 1259  BP: 126/78 135/75  Pulse: 78 78  Resp: 18 18  Temp:  98.7 F (37.1 C)   Vitals:   03/01/16 1236 03/01/16 1239 03/01/16 1250 03/01/16 1259  BP: 127/74 119/77 126/78 135/75  Pulse: 78 80 78 78  Resp: 18 18 18 18   Temp:    98.7 F (37.1 C)  TempSrc:    Oral  SpO2: 98% 96% 100% 100%  Weight:      Height:         Gen: not in distress HEENT:  No icterus, moist mucosa, supple neck Chest: clear b/l, no added sounds CVS: N S1&S2, no  murmurs,  [id number] abdomen with ascites ( improved from yesterday), RUQ tenderness resolved Musculoskeletal: warm, no edema   The results of significant diagnostics from this hospitalization (including imaging, microbiology, ancillary and laboratory) are listed below for reference.     Microbiology: Recent Results (from the past 240 hour(s))  Culture, Urine     Status: None   Collection Time: 02/27/16  3:16 AM  Result Value Ref Range Status   Specimen Description URINE, CLEAN CATCH  Final   Special Requests NONE  Final   Culture NO GROWTH  Final   Report Status 02/28/2016 FINAL  Final  Gram stain     Status: None   Collection Time: 02/27/16 10:27 AM  Result Value Ref Range Status   Specimen Description FLUID PERITONEAL  Final   Special Requests NONE  Final   Gram Stain   Final    RARE WBC PRESENT, PREDOMINANTLY MONONUCLEAR NO ORGANISMS SEEN    Report Status 02/27/2016 FINAL  Final  Culture, body fluid-bottle     Status: None (Preliminary result)   Collection Time: 02/27/16 10:27 AM  Result Value Ref Range Status   Specimen Description PERITONEAL  Final   Special Requests NONE  Final   Culture NO GROWTH 3 DAYS  Final   Report Status PENDING  Incomplete     Labs: BNP (last 3 results) No results for input(s): BNP in the last 8760 hours. Basic Metabolic Panel:  Recent Labs Lab 02/26/16 1424 02/27/16 0315 02/29/16 0712  NA 135 133* 134*  K 4.1 4.2 4.3  CL 99* 101 99*  CO2 27 24 25   GLUCOSE 109* 107* 130*  BUN 11 13 18   CREATININE 0.99 1.02 1.18  CALCIUM 8.5* 8.5* 8.3*   Liver Function Tests:  Recent Labs Lab 02/26/16 1424 02/27/16 0315 02/28/16 0834 02/29/16 0712 02/29/16 1414  AST 139* 136* 163* 95* 78*  ALT 184* 176* 189* 137* 118*  ALKPHOS 327* 290* 328* 276* 250*  BILITOT 1.9* 1.8* 3.0* 1.8* 1.0  PROT 6.5 6.2* 5.9* 6.6 6.1*  ALBUMIN 3.0* 2.9* 2.6* 2.6* 2.6*    Recent Labs Lab 02/26/16 1424  LIPASE 21   No results for input(s): AMMONIA  in the last 168 hours. CBC:  Recent Labs Lab 02/26/16 1424 02/27/16 0315  WBC 6.7 6.8  NEUTROABS 4.0  --   HGB 14.2 14.0  HCT 41.6 41.3  MCV 100.2* 99.8  PLT 242 270   Cardiac Enzymes: No results for input(s): CKTOTAL, CKMB, CKMBINDEX, TROPONINI in the last 168 hours. BNP: Invalid input(s): POCBNP CBG:  Recent Labs Lab 02/27/16 0737 02/27/16 1125  GLUCAP 103* 93   D-Dimer No results for input(s): DDIMER in the last 72 hours. Hgb A1c No results for input(s): HGBA1C in the last 72 hours. Lipid Profile No results for input(s): CHOL, HDL, LDLCALC, TRIG, CHOLHDL, LDLDIRECT in the last 72 hours. Thyroid function studies No results for input(s): TSH, T4TOTAL, T3FREE, THYROIDAB in the last 72 hours.  Invalid input(s): FREET3 Anemia work up No results for input(s): VITAMINB12, FOLATE, FERRITIN, TIBC, IRON, RETICCTPCT in the last 72 hours. Urinalysis    Component Value Date/Time   COLORURINE AMBER (A) 02/27/2016 0316   APPEARANCEUR CLEAR 02/27/2016 0316   LABSPEC 1.041 (H) 02/27/2016 0316   PHURINE 5.0 02/27/2016 0316   GLUCOSEU NEGATIVE 02/27/2016 0316   HGBUR NEGATIVE 02/27/2016 0316   BILIRUBINUR NEGATIVE 02/27/2016 0316   KETONESUR 5 (A) 02/27/2016 0316   PROTEINUR NEGATIVE 02/27/2016 0316   NITRITE NEGATIVE 02/27/2016 0316   LEUKOCYTESUR NEGATIVE 02/27/2016 0316   Sepsis Labs Invalid input(s): PROCALCITONIN,  WBC,  LACTICIDVEN Microbiology Recent Results (from the past 240 hour(s))  Culture, Urine     Status: None   Collection Time: 02/27/16  3:16 AM  Result Value Ref Range Status   Specimen Description URINE, CLEAN CATCH  Final   Special Requests NONE  Final   Culture NO GROWTH  Final   Report Status 02/28/2016 FINAL  Final  Gram stain     Status: None   Collection Time: 02/27/16 10:27 AM  Result Value Ref Range Status   Specimen Description FLUID PERITONEAL  Final   Special Requests NONE  Final   Gram Stain   Final    RARE WBC PRESENT, PREDOMINANTLY  MONONUCLEAR NO ORGANISMS SEEN    Report Status 02/27/2016 FINAL  Final  Culture, body fluid-bottle     Status: None (Preliminary result)   Collection Time: 02/27/16 10:27 AM  Result Value Ref Range Status   Specimen Description PERITONEAL  Final   Special Requests NONE  Final   Culture NO GROWTH 3 DAYS  Final   Report Status PENDING  Incomplete     Time coordinating discharge: Over 30 minutes  SIGNED:   Louellen Molder, MD  [facility] 03/01/2016, 1:42 PM Pager   If 7PM-7AM, please contact night-coverage [email or web address] Password [email or web address]

## 2016-03-01 NOTE — Consult Note (Signed)
Chief Complaint: abdominal pain  Referring Physician:Dr. Truitt Merle  Supervising Physician: Daryll Brod  Patient Status: [facility] - In-pt  HPI: Matthew Waller is an 71 y.o. male who has noticed worsening abdominal pain over the last several months.  He was having some diarrhea and having minimal appetite.  His pain was worsening in the RUQ and he presented to the [facility] for further evaluation.  He denies any weight loss.  He is otherwise healthy.  He had a CT scan on admission that revealed some intra and extrahepatic biliary dilatation along with possible findings of cirrhosis.  He also had an irregular thickening of his omentum of uncertain etiology that may represent carcinomatosis.  He was also noted to have ascites.  A paracentesis was performed and cytology confirmed carcinoma, but a primary source could not be determined.  He underwent MRCP and ERCP with stent placement to relieve a stricture in his duct.  His cytology brushings from this were negative.  We have been asked to biopsy his omentum for a hopeful tissue diagnosis.   Past Medical History:  Past Medical History:  Diagnosis Date  . Abdominal distension 02/2016    Past Surgical History:  Past Surgical History:  Procedure Laterality Date  . COSMETIC SURGERY     eyebrows  . DENTAL SURGERY    . ERCP N/A 02/28/2016   Procedure: ENDOSCOPIC RETROGRADE CHOLANGIOPANCREATOGRAPHY (ERCP);  Surgeon: Carol Ada, MD;  Location: [facility] ENDOSCOPY;  Service: Endoscopy;  Laterality: N/A;    Family History: History reviewed. No pertinent family history.  Social History:  reports that he quit smoking about 53 years ago. He has never used smokeless tobacco. He reports that he drinks alcohol. He reports that he does not use drugs.  Allergies: No Known Allergies  Medications: Medications reviewed in epic  Please HPI for pertinent positives, otherwise complete 10 system ROS negative.  Mallampati Score: MD Evaluation Airway: WNL Heart:  WNL Abdomen: WNL Abdomen comments: distended abdomen with ascites. Chest/ Lungs: WNL ASA  Classification: 2 Mallampati/Airway Score: One  Physical Exam: BP 125/69 (BP Location: Right Arm)   Pulse 67   Temp 98.7 F (37.1 C) (Oral)   Resp 18   Ht 6' 3"  (1.905 m)   Wt 230 lb (104.3 kg)   SpO2 96%   BMI 28.75 kg/m  Body mass index is 28.75 kg/m. General: pleasant, WD, WN white male who is laying in bed in NAD HEENT: head is normocephalic, atraumatic.  Sclera are noninjected.  PERRL.  Ears and nose without any masses or lesions.  Mouth is pink and moist Heart: regular, rate, and rhythm.  Normal s1,s2. No obvious murmurs, gallops, or rubs noted.  Palpable radial and pedal pulses bilaterally Lungs: CTAB, no wheezes, rhonchi, or rales noted.  Respiratory effort nonlabored Abd: soft, minimally tender throughout, mildly distended, +BS, no masses, hernias, or organomegaly Psych: A&Ox3 with an appropriate affect.   Labs: Results for orders placed or performed during the hospital encounter of 02/26/16 (from the past 48 hour(s))  Comprehensive metabolic panel     Status: Abnormal   Collection Time: 02/29/16  7:12 AM  Result Value Ref Range   Sodium 134 (L) 135 - 145 mmol/L   Potassium 4.3 3.5 - 5.1 mmol/L   Chloride 99 (L) 101 - 111 mmol/L   CO2 25 22 - 32 mmol/L   Glucose, Bld 130 (H) 65 - 99 mg/dL   BUN 18 6 - 20 mg/dL   Creatinine, Ser 1.18 0.61 -  1.24 mg/dL   Calcium 8.3 (L) 8.9 - 10.3 mg/dL   Total Protein 6.6 6.5 - 8.1 g/dL   Albumin 2.6 (L) 3.5 - 5.0 g/dL   AST 95 (H) 15 - 41 U/L   ALT 137 (H) 17 - 63 U/L   Alkaline Phosphatase 276 (H) 38 - 126 U/L   Total Bilirubin 1.8 (H) 0.3 - 1.2 mg/dL   GFR calc non Af Amer 59 (L) >60 mL/min   GFR calc Af Amer >60 >60 mL/min    Comment: (NOTE) The eGFR has been calculated using the CKD EPI equation. This calculation has not been validated in all clinical situations. eGFR's persistently <60 mL/min signify possible Chronic  Kidney Disease.    Anion gap 10 5 - 15  Hepatic function panel     Status: Abnormal   Collection Time: 02/29/16  2:14 PM  Result Value Ref Range   Total Protein 6.1 (L) 6.5 - 8.1 g/dL   Albumin 2.6 (L) 3.5 - 5.0 g/dL   AST 78 (H) 15 - 41 U/L   ALT 118 (H) 17 - 63 U/L   Alkaline Phosphatase 250 (H) 38 - 126 U/L   Total Bilirubin 1.0 0.3 - 1.2 mg/dL   Bilirubin, Direct 0.4 0.1 - 0.5 mg/dL   Indirect Bilirubin 0.6 0.3 - 0.9 mg/dL    Imaging: US Paracentesis  Result Date: 02/29/2016 INDICATION: Ascites of unknown etiology. Request is made for therapeutic paracentesis. EXAM: ULTRASOUND GUIDED THERAPEUTIC PARACENTESIS MEDICATIONS: 1% lidocaine. COMPLICATIONS: None immediate. PROCEDURE: Informed written consent was obtained from the patient after a discussion of the risks, benefits and alternatives to treatment. A timeout was performed prior to the initiation of the procedure. Initial ultrasound scanning demonstrates a small amount of ascites within the left upper abdominal quadrant. The left upper abdomen was prepped and draped in the usual sterile fashion. 1% lidocaine was used for local anesthesia. Following this, a 19 gauge, 7-cm, Yueh catheter was introduced. An ultrasound image was saved for documentation purposes. The paracentesis was performed. The catheter was removed and a dressing was applied. The patient tolerated the procedure well without immediate post procedural complication. FINDINGS: A total of approximately 2.9 L of yellow fluid was removed. IMPRESSION: Successful ultrasound-guided paracentesis yielding 2.9 liters of peritoneal fluid. Read by: Saverio Danker, PA-C Electronically Signed   By: Aletta Edouard M.D.   On: 02/29/2016 13:54   Dg Ercp Biliary & Pancreatic Ducts  Result Date: 02/28/2016 CLINICAL DATA:  Bile duct stone.  History of right abdominal pain. EXAM: ERCP TECHNIQUE: Multiple spot images obtained with the fluoroscopic device and submitted for interpretation  post-procedure. FLUOROSCOPY TIME:  Fluoroscopy Time:  2 minutes and 42 seconds Number of Acquired Spot Images: 7 COMPARISON:  Abdominal CT 02/26/2016 FINDINGS: Cannulation and opacification of the common bile duct. Wire was advanced into the intrahepatic bile ducts. There is a focal narrowing or stricture in the proximal extrahepatic bile duct which is probably the common hepatic duct. Bile ducts are dilated proximal to this narrowing. A nonmetallic biliary stent was placed across the narrowing. IMPRESSION: Focal narrowing in the proximal extrahepatic biliary system, probably involving the common hepatic duct. Placement of biliary stent. These images were submitted for radiologic interpretation only. Please see the procedural report for the amount of contrast and the fluoroscopy time utilized. Electronically Signed   By: Markus Daft M.D.   On: 02/28/2016 15:18   Mr Abdomen Mrcp Moise Boring Contast  Result Date: 02/29/2016 CLINICAL DATA:  Abdominal pain/ distension  with abnormal liver enzymes, recent ERCP showing stricture in the common hepatic duct across which a stent was placed. Hepatic morphology raises suspicion for cirrhosis. Ascites with infiltrative pattern in the omentum. EXAM: MRI ABDOMEN WITHOUT AND WITH CONTRAST (INCLUDING MRCP) TECHNIQUE: Multiplanar multisequence MR imaging of the abdomen was performed both before and after the administration of intravenous contrast. Heavily T2-weighted images of the biliary and pancreatic ducts were obtained, and three-dimensional MRCP images were rendered by post processing. CONTRAST:  20 cc MultiHance COMPARISON:  02/26/2016 FINDINGS: Lower chest: Atelectasis in the left lower lobe with subsegmental atelectasis in the right lower lobe. Small hiatal hernia. Hepatobiliary: Mildly nodular contour the liver with morphology suggesting cirrhosis. Contracted and somewhat thick-walled gallbladder. Periportal edema. The typical 3D time-of-flight MRCP sequence was nondiagnostic  due to motion artifact despite numerous attempts. Based on the other diagnostic sequences, there is minimal intrahepatic biliary dilatation extending to a approximately 1.7 cm region of accentuated enhancement within or surrounding the common hepatic duct. There is believed to be a stent traversing this region of stricture although admittedly the stent is difficult to visualize on most sequences, but its presence is suggested on image 21 of series 4. On arterial phase images there is some faintly accentuated enhancement along the margins of the gallbladder fossa. Mild heterogeneity of enhancement in the caudate lobe but without a well-defined mass. My impression is that there is low-grade enhancement along the strictured segment of the common hepatic duct and extending into some of the intrahepatic bile ducts particularly in the left hepatic lobe and anteriorly in the right hepatic lobe, for example on images 54 through 71 of series 11404. For the most part this extends along the walls of the bile ducts rather than forming a single large mass. Mild enhancement of the distal CBD wall approaching the ampulla for example on image 94/11404. Pancreas: Mildly obscured by motion artifact. 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 No mass observed. Spleen:  Unremarkable Adrenals/Urinary Tract: Adrenal glands normal. Kidneys unremarkable. Stomach/Bowel: Unremarkable Vascular/Lymphatic: Patent celiac trunk, SMA, and single bilateral renal arteries. No pathologic adenopathy observed. Other: Upper abdominal ascites with irregular fluid infiltration of the omentum. This demonstrates abnormal reticular enhancement, and there is also thin enhancement along the peritoneal margins of the ascites. Musculoskeletal: Unremarkable IMPRESSION: 1. Along the intrahepatic bile ducts and common hepatic duct, there is abnormal enhancement of the bile duct walls. There is also some CBD enhancement near the ampulla. The appearance is abnormal and could be  a manifestation of cholangitis, but given the focal stricture on prior MRCP also raises concern for cholangiocarcinoma. This is not so much masslike as potentially infiltrative along the bile duct walls. Unfortunately, despite numerous attempts, the 3D time-of-flight MRCP images could not be obtained for further spatial resolution assessment of the biliary tree. A stent is in place and there is only minimal intrahepatic biliary dilatation. 2. Ascites. Unusual nodular infiltration of the omentum with reticular associated enhancement, potentially from neoplastic involvement or inflammation of the omentum. There is subtle diffuse enhancement along the peritoneal margins of the ascites. 3. Small hiatal hernia. 4. Pancreas divisum. Electronically Signed   By: Van Clines M.D.   On: 02/29/2016 08:23    Assessment/Plan 1. Carcinoma, omental nodule -we will plan to proceed today with a biopsy of his omental nodule.  We will do this today -we just did a paracentesis yesterday.  He does not have enough fluid to place a PleurX at this.  He is also not an easy stick with  minimal safe windows.  The more fluid he can tolerate and build up the better chance we have of getting a catheter in place. -there are no currently plans to immediately start chemotherapy given we are waiting on a tissue diagnosis.  We can plan to proceed with a PAC placement likely as an outpatient.  This was discussed with the patient.  Thank you for this interesting consult.  I greatly enjoyed meeting Matthew Waller and look forward to participating in their care.  A copy of this report was sent to the requesting provider on this date.  Electronically Signed: Henreitta Cea 03/01/2016, 11:07 AM   I spent a total of 40 Minutes    in face to face in clinical consultation, greater than 50% of which was counseling/coordinating care for omental nodule

## 2016-03-01 NOTE — Procedures (Signed)
S/p CT LT OMENTAL BX  No comp Stable Path pending Full report in PACS

## 2016-03-01 NOTE — Care Management Important Message (Signed)
Important Message  Patient Details  Name: Matthew Waller MRN: [id number] Date of Birth: 12-25-1945   Medicare Important Message Given:  Yes    [name] [name] 03/01/2016, 2:30 PM

## 2016-03-01 NOTE — Progress Notes (Signed)
Discharge paperwork reviewed with patient. IV removed. Prescriptions given. Patient is ready for discharge.

## 2016-03-01 NOTE — Sedation Documentation (Signed)
Patient is resting comfortably. 

## 2016-03-03 LAB — CULTURE, BODY FLUID-BOTTLE: CULTURE: NO GROWTH

## 2016-03-03 LAB — CULTURE, BODY FLUID W GRAM STAIN -BOTTLE

## 2016-03-04 ENCOUNTER — Other Ambulatory Visit: Payer: Self-pay | Admitting: Hematology

## 2016-03-04 ENCOUNTER — Telehealth: Payer: Self-pay | Admitting: *Deleted

## 2016-03-04 ENCOUNTER — Telehealth: Payer: Self-pay | Admitting: Hematology

## 2016-03-04 DIAGNOSIS — C240 Malignant neoplasm of extrahepatic bile duct: Secondary | ICD-10-CM

## 2016-03-04 NOTE — Telephone Encounter (Signed)
Oncology Nurse Navigator Documentation  Oncology Nurse Navigator Flowsheets 03/04/2016  Navigator Location [facility]  Navigator Encounter Type Telephone  Telephone Outgoing Call;Patient Update  Barriers/Navigation Needs Coordination of Care  Interventions Coordination of Care  Acuity Level 2  Time Spent with Patient 15  Called patient to make him aware of port placement order and IR will be calling with this. Managed care is working on the PA for his PET scan. He is asking for fluid to be tapped with the Pleurx procedure as soon this week as possible. Says he is getting uncomfortable and fluid is building up again (last tapped 1/25 for 2.7 liters). Spoke w/new patient scheduler, Seth Bake and she is working on his office visit appointment. Wife also reports he only has #7 Ambien on hand and will need refill called in if they are not seen in time.

## 2016-03-04 NOTE — Telephone Encounter (Signed)
Received call from pt stating that he wants to know the procedure for getting a second opinion at [facility].  He states I think I should get PET scan before second opinion though.  Message to Dr Burr Medico.

## 2016-03-04 NOTE — Telephone Encounter (Signed)
Spoke to the pt's wife to schedule an appt for the pt to see Dr. Burr Medico on 2/5 at 330pm. She was aware of chemo education class as well, but wanted that appt this week. Chemo ed appt was made for 1/30 at 4pm. Aware to arrive 30 minutes early for both appts.

## 2016-03-04 NOTE — Telephone Encounter (Signed)
Left vm to schedule.

## 2016-03-05 ENCOUNTER — Encounter: Payer: Self-pay | Admitting: *Deleted

## 2016-03-05 ENCOUNTER — Other Ambulatory Visit: Payer: Self-pay | Admitting: Radiology

## 2016-03-05 ENCOUNTER — Telehealth: Payer: Self-pay | Admitting: *Deleted

## 2016-03-05 ENCOUNTER — Ambulatory Visit (HOSPITAL_BASED_OUTPATIENT_CLINIC_OR_DEPARTMENT_OTHER): Payer: PPO | Admitting: Hematology

## 2016-03-05 ENCOUNTER — Other Ambulatory Visit: Payer: Self-pay | Admitting: General Surgery

## 2016-03-05 ENCOUNTER — Other Ambulatory Visit: Payer: PPO

## 2016-03-05 VITALS — BP 152/98 | HR 87 | Temp 97.6°F | Resp 16 | Ht 75.0 in | Wt 227.8 lb

## 2016-03-05 DIAGNOSIS — C786 Secondary malignant neoplasm of retroperitoneum and peritoneum: Secondary | ICD-10-CM | POA: Diagnosis not present

## 2016-03-05 DIAGNOSIS — K5900 Constipation, unspecified: Secondary | ICD-10-CM

## 2016-03-05 DIAGNOSIS — R740 Nonspecific elevation of levels of transaminase and lactic acid dehydrogenase [LDH]: Secondary | ICD-10-CM

## 2016-03-05 DIAGNOSIS — C240 Malignant neoplasm of extrahepatic bile duct: Secondary | ICD-10-CM

## 2016-03-05 DIAGNOSIS — Z7189 Other specified counseling: Secondary | ICD-10-CM

## 2016-03-05 MED ORDER — LACTULOSE 20 GM/30ML PO SOLN: 30.0000 mL | ORAL | 1 refills | Status: DC | PRN

## 2016-03-05 MED ORDER — MIRTAZAPINE 15 MG PO TABS: 15.0000 mg | ORAL_TABLET | Freq: Every day | ORAL | 2 refills | Status: DC

## 2016-03-05 NOTE — Progress Notes (Signed)
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 Irregular thickening is seen involving the omentum.       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      02/28/2016 Procedure    ERCP by Dr. Benson Norway, a stricture was found in the CBD, status post stent placement      02/28/2016 Pathology Results    CBD brush cytology was positive for atypical cells, nondiagnostic for malignancy.      02/29/2016 Imaging    Abdominal MRI with and without contrast showed abnormal enhancement of the bile duct was both in the intrahepatic and common bile duct. Ascites, unusual nodular infiltration of the omentum.      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 He was admitted on Gen. 22nd 2018 for further workup. CT of abdomen showed mild intrahepatic and extrahepatic biliary dilatation, possible liver cirrhosis, irregular thickening of the omentum, and ascites. He underwent paracentesis on general 23rd 2018, and cytology was positive for malignant adenocarcinoma. He was seen by GI  service, and underwent ERCP by Dr. Benson Norway yesterday. A strict in CBD was noticed, status post stent placement. Brush cytology was positive for a typical cells, not diagnostic for malignancy. He had repeated paracentesis yesterday with 2.9L fluids removed, still feels bloated, right side abdominal pain has improved, he ambulates in the hallway.  CURRENT THERAPY: pending chemo cisplatin and gemcitabine   INTERIM HISTORY: Mr. Dunwoody presents today for follow up after his ED visit on 02/29/2016. He has been experiencing fatigue, constipation, and loss of appetite. His appetite has progressively been getting worse. He eats less each day. They haven't tried any nutrient supplements. He has been much more bloated since last week. He has abdominal pain. Each night gets worse and it is preventing him from sleeping. He sleeps on his left side at night. He is getting his procedure done tomorrow. He has been taking pain medication at night, but has not been taking it during the day since that has been causing his constipation. He has been taking one packet of MiraLax every night. Yesterday he sounded very hoarse. Denies any other concerns.   REVIEW OF SYSTEMS:   Constitutional: Denies fevers, chills or abnormal weight loss (+) fatigue, loss of appetite.  Eyes: Denies blurriness of vision Ears, nose, mouth, throat, and face: Denies mucositis or sore throat Respiratory: Denies cough, dyspnea or wheezes (+) hoarseness  Cardiovascular: Denies palpitation, chest discomfort or lower extremity swelling Gastrointestinal:  Denies nausea, heartburn or change in bowel habits (+) constipation, bloating, abdominal pain Skin: Denies abnormal skin rashes Lymphatics: Denies new lymphadenopathy or easy bruising Neurological:Denies numbness, tingling or new weaknesses Behavioral/Psych:  Mood is stable, no new changes  All other systems were reviewed with the patient and are negative.  MEDICAL HISTORY:  Past Medical History:    Diagnosis Date  . Abdominal distension 02/2016    SURGICAL HISTORY: Past Surgical History:  Procedure Laterality Date  . COSMETIC SURGERY     eyebrows  . DENTAL SURGERY    . ERCP N/A 02/28/2016   Procedure: ENDOSCOPIC RETROGRADE CHOLANGIOPANCREATOGRAPHY (ERCP);  Surgeon: Carol Ada, MD;  Location: [facility] ENDOSCOPY;  Service: Endoscopy;  Laterality: N/A;    I have reviewed the social history and family history with the patient and they are unchanged from previous note.  ALLERGIES:  has No Known Allergies.  MEDICATIONS:  Current Outpatient Prescriptions  Medication Sig Dispense Refill  . Carboxymethylcell-Hypromellose (GENTEAL OP) Place 1 drop into both eyes daily.    . Carboxymethylcell-Hypromellose (GENTEAL) 0.25-0.3 % GEL Place 1 drop into both eyes at bedtime.    . Ginger, Zingiber officinalis, (GINGER ROOT) 550 MG CAPS Take 550 mg by mouth daily.    Marland Kitchen GLUCOSAMINE-CHONDROITIN PO Take 2 capsules by mouth daily.    Marland Kitchen ibuprofen (ADVIL,MOTRIN) 200 MG tablet Take 400 mg by mouth every 6 (six) hours as needed (pain).    Marland Kitchen loratadine (CLARITIN) 10 MG tablet Take 10 mg by mouth daily.    . Omega-3 Fatty Acids (FISH OIL) 1000 MG CAPS Take 1,000 mg by mouth daily.    Marland Kitchen OVER THE COUNTER MEDICATION Take 1 capsule by mouth daily. Prostate Health    . oxyCODONE (ROXICODONE) 5 MG immediate release tablet Take 1 tablet (5 mg total) by mouth every 6 (six) hours as needed for severe pain. 20 tablet 0  . polyethylene glycol (MIRALAX / GLYCOLAX) packet Take 17 g by mouth daily as needed. 14 each 0  . zolpidem (AMBIEN) 5 MG tablet Take 1 tablet (5 mg total) by mouth at bedtime as needed for sleep. 10 tablet 0   No current facility-administered medications for this visit.     PHYSICAL EXAMINATION: ECOG PERFORMANCE STATUS: 3 - Symptomatic, >50% confined to bed  Vitals:   03/05/16 1514  BP: (!) 152/98  Pulse: 87  Resp: 16  Temp: 97.6 F (36.4 C)   Filed Weights   03/05/16 1514  Weight: 227  lb 12.8 oz (103.3 kg)   GENERAL:alert, no distress and comfortable  SKIN: skin color, texture, turgor are normal, no rashes or significant lesions EYES: normal, Conjunctiva are pink and non-injected, sclera clear OROPHARYNX:no exudate, no erythema and lips, buccal mucosa, and tongue normal  NECK: supple, thyroid normal size, non-tender, without nodularity LYMPH:  no palpable lymphadenopathy in the cervical, axillary or inguinal LUNGS: clear to auscultation and percussion with normal breathing effort HEART: regular rate & rhythm and no murmurs and no lower extremity edema ABDOMEN:abdomen soft, distended, mild tenderness in the right upper quadrant, no organomegaly, (+) ascites, bowel sounds normal.  Musculoskeletal:no cyanosis of digits and no clubbing  NEURO: alert & oriented x 3 with fluent speech, no focal motor/sensory deficits  LABORATORY DATA:  I have reviewed the data as listed CBC Latest Ref Rng & Units 02/27/2016 02/26/2016  WBC 4.0 - 10.5 K/uL 6.8 6.7  Hemoglobin 13.0 - 17.0 g/dL 14.0 14.2  Hematocrit 39.0 - 52.0 % 41.3 41.6  Platelets 150 - 400 K/uL 270 242     CMP Latest Ref Rng & Units 02/29/2016 02/29/2016 02/28/2016  Glucose 65 - 99 mg/dL - 130(H) -  BUN 6 - 20 mg/dL - 18 -  Creatinine 0.61 - 1.24 mg/dL - 1.18 -  Sodium 135 - 145 mmol/L - 134(L) -  Potassium 3.5 - 5.1 mmol/L - 4.3 -  Chloride 101 - 111 mmol/L - 99(L) -  CO2 22 - 32 mmol/L - 25 -  Calcium 8.9 - 10.3 mg/dL - 8.3(L) -  Total Protein 6.5 - 8.1 g/dL 6.1(L) 6.6 5.9(L)  Total Bilirubin 0.3 - 1.2 mg/dL 1.0 1.8(H) 3.0(H)  Alkaline Phos 38 - 126 U/L 250(H) 276(H) 328(H)  AST 15 - 41 U/L 78(H) 95(H) 163(H)  ALT 17 - 63 U/L 118(H) 137(H) 189(H)    PATHOLOGY REPORT  Diagnosis 03/01/2016 Omentum, biopsy, Left Stranding/Nodularity - METASTATIC ADENOCARCINOMA, SEE COMMENT. Microscopic Comment The cells are positive for cytokeratin 7, cytokeratin 20, and CDX2. They are negative for TTF-1, Napsin, prostein,  and PSA. The phenotype is consistent with a pancreaticobiliary or gastrointestinal primary. Dr. Lyndon Code has reviewed the case. The case was called to Dr. Burr Medico. As requested material will be sent for [facility] with PDL-1 testing.   Diagnosis 02/28/2016 COMMON BILE DUCT BRUSHING (SPECIMEN 1 OF 1 COLLECTED 02/28/2016) ATYPICAL DUCTAL EPITHELIAL CELLS PRESENT, SEE COMMENT.  Comment There are only a few clusters of atypical cells, which are not diagnostic for malignancy. There is no cell block material.  Diagnosis 02/27/2016 PERITONEAL/ASCITIC FLUID, ABDOMINAL (SPECIMEN 1 OF 1 COLLECTED 02-27-2016) MALIGNANT CELLS PRESENT, CONSISTENT WITH CARCINOMA. SEE COMMENT.  COMMENT: THE MALIGNANT CELLS ARE POSITIVE FOR MOC-31. THEY ARE NEGATIVE FOR CALRETININ, WT-1, CD56, AND TTF-1. THE FINDINGS ARE CONSISTENT WITH CARCINOMA, BUT THE PRIMARY SITE IS UNCLEAR. THERE IS LIKELY INSUFFICIENT TUMOR PRESENT FOR ADDITIONAL STUDIES, IF REQUESTED. DR. Jenny Reichmann PATRICK HAS REVIEWED THE CASE AND CONCURS WITH THIS INTERPRETATION.   RADIOGRAPHIC STUDIES: I have personally reviewed the radiological images as listed and agreed with the findings in the report.  MR Abdomen w/wo CM/MRCP 02/29/2016 IMPRESSION: 1. Along the intrahepatic bile ducts and common hepatic duct, there is abnormal enhancement of the bile duct walls. There is also some CBD enhancement near the ampulla. The appearance is abnormal and could be a manifestation of cholangitis, but given the focal stricture on prior MRCP also raises concern for cholangiocarcinoma. This is not so much masslike as potentially infiltrative along the bile duct walls. Unfortunately, despite numerous attempts, the 3D time-of-flight MRCP images could not be obtained for further spatial resolution assessment of the biliary tree. A stent is in place and there is only minimal intrahepatic biliary dilatation. 2. Ascites. Unusual nodular infiltration of the omentum with reticular  associated enhancement, potentially from neoplastic involvement or inflammation of the omentum. There is subtle diffuse enhancement along the peritoneal margins of the ascites. 3. Small hiatal hernia. 4. Pancreas divisum.  ERCP Dr. Benson Norway 02/28/2016 - A segmental biliary stricture was found. The stricture was indeterminate. This stricture was treated with stent placement. - One plastic stent was placed into the common bile duct.  ASSESSMENT & PLAN:  71 y.o. Caucasian male, without significant past medical history, previously very healthy and exercise regularly, presented with 2 weeks history of abdominal pain, bloating jaundice and ascites  1. 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 I have requested Foundation one genomic testing and PD-L1 on his  biopsy tissue.  -tumor markers showed a normal AFP and CEA, elevated CA 19-9, supporting cholangiole carcinoma. -He had a colonoscopy one year ago (Jan 2017) at [facility], which was negative per patient. -I have ordered a PET scan for further staging  -Unfortunately his cancer is incurable at this stage, and the goal of therapy is palliative, to prolong his life and palliate his symptoms. -Although he is 16, he was previously very healthy and fit, performance status is still preserved, he is a candidate for systemic chemotherapy. I discussed the options of palliative chemotherapy regimens, including cisplatin and gemcitabine, or FOLFOX, versus single agent gemcitabine. -due to his limited PS, I will start single agent gemcitabine next week, if he tolerates well, will add cisplatin  -His  ascites has reaccumulated quickly, it may take a few to several weeks for chemotherapy to control his disease, I recommend a Pleurx placed by IR, for symptom management. He agrees. It's scheduled for tomorrow   -I recommended a port placement by IR for chemo  ---He would like a second opinion. I have referred him to Dr. Leamon Arnt at [facility].   2. Constipation -Secondary to pain medication. Encouraged him to continue taking pain medication. -Takes one packet of MiraLax every night before bed.  -I have recommended that he take more than one packet of MiraLax (5 packets - MiraLax bomb).  -I have also recommended Senna and Lactolose.   3. Anorexia -Referred to our nutritionist -Encouraged to use Boost or Ensure.  -Prescribe Mirtazapine to help increase his appetite.   4. Obstructive jaundice and transaminitis -Secondary to cholangiocarcinoma, status post CBD stent placement -Jaundice has resolved.  4. Goal of care discussion  -We again discussed the incurable nature of his cancer, and the overall poor prognosis, especially if he does not have good response to chemotherapy or progress on chemo -The patient understands the goal of care is palliative. -I recommend DNR/DNI, he will think about it    Plan -Start chemo next week with single agent gemcitabine. He has chemo class today  -[facility] and PD-L1 was requested today  -Refer to Dr. Laurance Flatten at [facility].   All questions were answered. The patient knows to call the clinic with any problems, questions or concerns. No barriers to learning was detected.  I spent 40 minutes counseling the patient face to face. The total time spent in the appointment was 45 minutes and more than 50% was on counseling and review of test results. Patient and his wife had many questions, I answered to their satisfaction. The   This document serves as a record of services personally performed by Truitt Merle, MD. It was created on her behalf by Martinique Casey, a trained  medical scribe. The creation of this record is based on the scribe's personal observations and the provider's statements to them. This document has been checked and approved by the attending provider.  I have reviewed the above documentation for accuracy and completeness and I agree with the above.   Truitt Merle, MD  03/05/2016

## 2016-03-05 NOTE — Progress Notes (Signed)
Oncology Nurse Navigator Documentation  Oncology Nurse Navigator Flowsheets 03/05/2016  Navigator Location [facility]  Navigator Encounter Type Letter/Fax/Email  Telephone -  Barriers/Navigation Needs Coordination of Care--PET scan  Interventions Coordination of Care--PA completed for PET by managed care and scan scheduled for 03/07/16 at 0700/0730.  Printed information on appointment and gave to collaborative nurse to give to patient today.  Coordination of Care Radiology  Acuity -  Time Spent with Patient 15

## 2016-03-05 NOTE — Telephone Encounter (Signed)
Called & spoke with pt & request that he come in by 3 pm today & register & see Dr Burr Medico before chemo teaching class at 4 pm.  Pt agreed.  Notified HIM to schedule.

## 2016-03-05 NOTE — Telephone Encounter (Signed)
Received call from wife  Helene Kelp stating that pt has had constipation since last Monday 02/26/16 while still in the hospital.  Stated providers aware, and gave pt Miralax to take at home but no results.  Stated pt has had little appetite - drinking lots of water, and some protein drinks.  Denied nausea, denied vomiting, just no appetite. Helene Kelp felt that most fluids drained into pt's abdomen.  Per wife, pt to have draining catheter placed along with port placement on Wed 03/06/16.   Instructed Helene Kelp to get Magnesium Citrate -  Give pt half bottle now, wait 2 hours, if no results, pt can drink rest of magnesium citrate bottle.  Instructed Helene Kelp to keep chemo education appt today as scheduled.  Dr. Burr Medico notified of pt's above issues.  Helene Kelp voiced understanding.

## 2016-03-06 ENCOUNTER — Inpatient Hospital Stay (HOSPITAL_COMMUNITY)
Admission: EM | Admit: 2016-03-06 | Discharge: 2016-03-16 | DRG: 388 | Disposition: A | Discharge status: Hospice | Payer: PPO | Attending: Internal Medicine | Admitting: Internal Medicine

## 2016-03-06 ENCOUNTER — Other Ambulatory Visit: Payer: Self-pay | Admitting: Hematology

## 2016-03-06 ENCOUNTER — Ambulatory Visit (HOSPITAL_COMMUNITY)
Admission: RE | Admit: 2016-03-06 | Discharge: 2016-03-06 | Disposition: A | Discharge status: Home / Self Care | Payer: PPO | Source: Ambulatory Visit | Attending: Hematology | Admitting: Hematology

## 2016-03-06 ENCOUNTER — Encounter (HOSPITAL_COMMUNITY): Payer: Self-pay | Admitting: Emergency Medicine

## 2016-03-06 ENCOUNTER — Encounter: Payer: Self-pay | Admitting: Hematology

## 2016-03-06 ENCOUNTER — Encounter (HOSPITAL_COMMUNITY): Payer: Self-pay | Admitting: Internal Medicine

## 2016-03-06 ENCOUNTER — Emergency Department (HOSPITAL_COMMUNITY): Payer: PPO

## 2016-03-06 ENCOUNTER — Telehealth: Payer: Self-pay

## 2016-03-06 ENCOUNTER — Encounter (HOSPITAL_COMMUNITY): Payer: Self-pay

## 2016-03-06 DIAGNOSIS — K56609 Unspecified intestinal obstruction, unspecified as to partial versus complete obstruction: Secondary | ICD-10-CM | POA: Diagnosis present

## 2016-03-06 DIAGNOSIS — Z6828 Body mass index (BMI) 28.0-28.9, adult: Secondary | ICD-10-CM

## 2016-03-06 DIAGNOSIS — Z66 Do not resuscitate: Secondary | ICD-10-CM | POA: Diagnosis not present

## 2016-03-06 DIAGNOSIS — E43 Unspecified severe protein-calorie malnutrition: Secondary | ICD-10-CM | POA: Insufficient documentation

## 2016-03-06 DIAGNOSIS — R112 Nausea with vomiting, unspecified: Secondary | ICD-10-CM | POA: Diagnosis not present

## 2016-03-06 DIAGNOSIS — K5669 Other partial intestinal obstruction: Principal | ICD-10-CM | POA: Diagnosis present

## 2016-03-06 DIAGNOSIS — K7460 Unspecified cirrhosis of liver: Secondary | ICD-10-CM | POA: Diagnosis present

## 2016-03-06 DIAGNOSIS — S8001XA Contusion of right knee, initial encounter: Secondary | ICD-10-CM | POA: Diagnosis not present

## 2016-03-06 DIAGNOSIS — Z0189 Encounter for other specified special examinations: Secondary | ICD-10-CM

## 2016-03-06 DIAGNOSIS — Z515 Encounter for palliative care: Secondary | ICD-10-CM | POA: Diagnosis not present

## 2016-03-06 DIAGNOSIS — Z87891 Personal history of nicotine dependence: Secondary | ICD-10-CM | POA: Insufficient documentation

## 2016-03-06 DIAGNOSIS — J342 Deviated nasal septum: Secondary | ICD-10-CM

## 2016-03-06 DIAGNOSIS — I129 Hypertensive chronic kidney disease with stage 1 through stage 4 chronic kidney disease, or unspecified chronic kidney disease: Secondary | ICD-10-CM | POA: Diagnosis not present

## 2016-03-06 DIAGNOSIS — C240 Malignant neoplasm of extrahepatic bile duct: Secondary | ICD-10-CM | POA: Diagnosis not present

## 2016-03-06 DIAGNOSIS — E860 Dehydration: Secondary | ICD-10-CM | POA: Diagnosis present

## 2016-03-06 DIAGNOSIS — C801 Malignant (primary) neoplasm, unspecified: Secondary | ICD-10-CM

## 2016-03-06 DIAGNOSIS — E875 Hyperkalemia: Secondary | ICD-10-CM | POA: Diagnosis not present

## 2016-03-06 DIAGNOSIS — C786 Secondary malignant neoplasm of retroperitoneum and peritoneum: Secondary | ICD-10-CM | POA: Diagnosis not present

## 2016-03-06 DIAGNOSIS — Z4682 Encounter for fitting and adjustment of non-vascular catheter: Secondary | ICD-10-CM | POA: Diagnosis not present

## 2016-03-06 DIAGNOSIS — Y92231 Patient bathroom in hospital as the place of occurrence of the external cause: Secondary | ICD-10-CM | POA: Diagnosis not present

## 2016-03-06 DIAGNOSIS — R180 Malignant ascites: Secondary | ICD-10-CM | POA: Diagnosis present

## 2016-03-06 DIAGNOSIS — Z7189 Other specified counseling: Secondary | ICD-10-CM | POA: Diagnosis not present

## 2016-03-06 DIAGNOSIS — W19XXXA Unspecified fall, initial encounter: Secondary | ICD-10-CM | POA: Diagnosis not present

## 2016-03-06 DIAGNOSIS — C7889 Secondary malignant neoplasm of other digestive organs: Secondary | ICD-10-CM | POA: Diagnosis not present

## 2016-03-06 DIAGNOSIS — E440 Moderate protein-calorie malnutrition: Secondary | ICD-10-CM | POA: Diagnosis not present

## 2016-03-06 DIAGNOSIS — N183 Chronic kidney disease, stage 3 unspecified: Secondary | ICD-10-CM

## 2016-03-06 DIAGNOSIS — K831 Obstruction of bile duct: Secondary | ICD-10-CM | POA: Diagnosis present

## 2016-03-06 DIAGNOSIS — R188 Other ascites: Secondary | ICD-10-CM | POA: Diagnosis not present

## 2016-03-06 DIAGNOSIS — C221 Intrahepatic bile duct carcinoma: Secondary | ICD-10-CM | POA: Diagnosis present

## 2016-03-06 DIAGNOSIS — N179 Acute kidney failure, unspecified: Secondary | ICD-10-CM | POA: Diagnosis present

## 2016-03-06 DIAGNOSIS — K56600 Partial intestinal obstruction, unspecified as to cause: Secondary | ICD-10-CM | POA: Diagnosis not present

## 2016-03-06 DIAGNOSIS — R1110 Vomiting, unspecified: Secondary | ICD-10-CM | POA: Diagnosis not present

## 2016-03-06 HISTORY — DX: Chronic kidney disease, unspecified: N18.9

## 2016-03-06 HISTORY — PX: IR GENERIC HISTORICAL: IMG1180011

## 2016-03-06 LAB — COMPREHENSIVE METABOLIC PANEL
ALT: 36 U/L (ref 17–63)
ANION GAP: 11 (ref 5–15)
AST: 45 U/L — ABNORMAL HIGH (ref 15–41)
Albumin: 2.3 g/dL — ABNORMAL LOW (ref 3.5–5.0)
Alkaline Phosphatase: 107 U/L (ref 38–126)
BUN: 42 mg/dL — AB (ref 6–20)
CHLORIDE: 91 mmol/L — AB (ref 101–111)
CO2: 32 mmol/L (ref 22–32)
Calcium: 8.5 mg/dL — ABNORMAL LOW (ref 8.9–10.3)
Creatinine, Ser: 1.36 mg/dL — ABNORMAL HIGH (ref 0.61–1.24)
GFR calc Af Amer: 58 mL/min — ABNORMAL LOW (ref >60)
GFR, EST NON AFRICAN AMERICAN: 50 mL/min — AB (ref >60)
Glucose, Bld: 144 mg/dL — ABNORMAL HIGH (ref 65–99)
POTASSIUM: 4.6 mmol/L (ref 3.5–5.1)
Sodium: 134 mmol/L — ABNORMAL LOW (ref 135–145)
TOTAL PROTEIN: 6.7 g/dL (ref 6.5–8.1)
Total Bilirubin: 0.8 mg/dL (ref 0.3–1.2)

## 2016-03-06 LAB — CBC WITH DIFFERENTIAL/PLATELET
BASOS PCT: 0 %
Basophils Absolute: 0 10*3/uL (ref 0.0–0.1)
EOS PCT: 0 %
Eosinophils Absolute: 0 10*3/uL (ref 0.0–0.7)
HCT: 41.4 % (ref 39.0–52.0)
Hemoglobin: 14.3 g/dL (ref 13.0–17.0)
Lymphocytes Relative: 7 %
Lymphs Abs: 1 10*3/uL (ref 0.7–4.0)
MCH: 34 pg (ref 26.0–34.0)
MCHC: 34.5 g/dL (ref 30.0–36.0)
MCV: 98.6 fL (ref 78.0–100.0)
MONOS PCT: 13 %
Monocytes Absolute: 1.7 10*3/uL — ABNORMAL HIGH (ref 0.1–1.0)
Neutro Abs: 10.3 10*3/uL — ABNORMAL HIGH (ref 1.7–7.7)
Neutrophils Relative %: 80 %
PLATELETS: 429 10*3/uL — AB (ref 150–400)
RBC: 4.2 MIL/uL — ABNORMAL LOW (ref 4.22–5.81)
RDW: 13.4 % (ref 11.5–15.5)
WBC: 12.9 10*3/uL — ABNORMAL HIGH (ref 4.0–10.5)

## 2016-03-06 LAB — BASIC METABOLIC PANEL
Anion gap: 14 (ref 5–15)
BUN: 38 mg/dL — AB (ref 6–20)
CALCIUM: 8.9 mg/dL (ref 8.9–10.3)
CO2: 28 mmol/L (ref 22–32)
CREATININE: 1.34 mg/dL — AB (ref 0.61–1.24)
Chloride: 90 mmol/L — ABNORMAL LOW (ref 101–111)
GFR calc Af Amer: 59 mL/min — ABNORMAL LOW (ref >60)
GFR, EST NON AFRICAN AMERICAN: 51 mL/min — AB (ref >60)
GLUCOSE: 152 mg/dL — AB (ref 65–99)
Potassium: 4.9 mmol/L (ref 3.5–5.1)
Sodium: 132 mmol/L — ABNORMAL LOW (ref 135–145)

## 2016-03-06 LAB — CBC
HCT: 40.6 % (ref 39.0–52.0)
Hemoglobin: 14.1 g/dL (ref 13.0–17.0)
MCH: 33.9 pg (ref 26.0–34.0)
MCHC: 34.7 g/dL (ref 30.0–36.0)
MCV: 97.6 fL (ref 78.0–100.0)
PLATELETS: 422 10*3/uL — AB (ref 150–400)
RBC: 4.16 MIL/uL — ABNORMAL LOW (ref 4.22–5.81)
RDW: 13.5 % (ref 11.5–15.5)
WBC: 12.3 10*3/uL — AB (ref 4.0–10.5)

## 2016-03-06 LAB — LIPASE, BLOOD: LIPASE: 21 U/L (ref 11–51)

## 2016-03-06 LAB — I-STAT CG4 LACTIC ACID, ED: LACTIC ACID, VENOUS: 1.6 mmol/L (ref 0.5–1.9)

## 2016-03-06 LAB — PROTIME-INR
INR: 1.26
Prothrombin Time: 15.9 seconds — ABNORMAL HIGH (ref 11.4–15.2)

## 2016-03-06 MED ORDER — FENTANYL CITRATE (PF) 100 MCG/2ML IJ SOLN
INTRAMUSCULAR | Status: AC
  Filled 2016-03-06: qty 4

## 2016-03-06 MED ORDER — ONDANSETRON HCL 4 MG/2ML IJ SOLN
4.0000 mg | Freq: Four times a day (QID) | INTRAMUSCULAR | Status: DC | PRN
  Administered 2016-03-07 – 2016-03-14 (×5): 4 mg via INTRAVENOUS
  Filled 2016-03-06 (×5): qty 2

## 2016-03-06 MED ORDER — IOPAMIDOL (ISOVUE-300) INJECTION 61%
100.0000 mL | Freq: Once | INTRAVENOUS | Status: AC | PRN
  Administered 2016-03-06: 100 mL via INTRAVENOUS

## 2016-03-06 MED ORDER — SODIUM CHLORIDE 0.9 % IV SOLN
INTRAVENOUS | Status: DC
  Administered 2016-03-06 (×2): via INTRAVENOUS

## 2016-03-06 MED ORDER — ONDANSETRON HCL 4 MG PO TABS: 4.0000 mg | ORAL_TABLET | Freq: Four times a day (QID) | ORAL | Status: DC | PRN

## 2016-03-06 MED ORDER — ONDANSETRON HCL 4 MG/2ML IJ SOLN: 4.0000 mg | Freq: Once | INTRAMUSCULAR | Status: DC

## 2016-03-06 MED ORDER — PHENOL 1.4 % MT LIQD
2.0000 | OROMUCOSAL | Status: DC | PRN
Start: 2016-03-06 — End: 2016-03-16
  Filled 2016-03-06: qty 177

## 2016-03-06 MED ORDER — POLYVINYL ALCOHOL 1.4 % OP SOLN
1.0000 [drp] | Freq: Every day | OPHTHALMIC | Status: DC
  Administered 2016-03-07: 1 [drp] via OPHTHALMIC
  Filled 2016-03-06: qty 15

## 2016-03-06 MED ORDER — ONDANSETRON HCL 4 MG/2ML IJ SOLN
4.0000 mg | Freq: Once | INTRAMUSCULAR | Status: AC
  Administered 2016-03-06: 4 mg via INTRAVENOUS

## 2016-03-06 MED ORDER — METOCLOPRAMIDE HCL 5 MG/ML IJ SOLN: 5.0000 mg | Freq: Four times a day (QID) | INTRAMUSCULAR | Status: DC | PRN

## 2016-03-06 MED ORDER — MIDAZOLAM HCL 2 MG/2ML IJ SOLN
INTRAMUSCULAR | Status: AC | PRN
  Administered 2016-03-06: 1 mg via INTRAVENOUS
  Administered 2016-03-06 (×3): 0.5 mg via INTRAVENOUS

## 2016-03-06 MED ORDER — SODIUM CHLORIDE 0.9 % IV SOLN
Freq: Once | INTRAVENOUS | Status: AC
  Administered 2016-03-06: 22:00:00 via INTRAVENOUS

## 2016-03-06 MED ORDER — SODIUM CHLORIDE 0.9 % IV SOLN
8.0000 mg | Freq: Four times a day (QID) | INTRAVENOUS | Status: DC | PRN
  Filled 2016-03-06: qty 4

## 2016-03-06 MED ORDER — IOPAMIDOL (ISOVUE-300) INJECTION 61%
INTRAVENOUS | Status: AC
  Filled 2016-03-06: qty 100

## 2016-03-06 MED ORDER — MAGIC MOUTHWASH
15.0000 mL | Freq: Four times a day (QID) | ORAL | Status: DC | PRN
  Filled 2016-03-06: qty 15

## 2016-03-06 MED ORDER — LIDOCAINE HCL (PF) 1 % IJ SOLN
INTRAMUSCULAR | Status: AC | PRN
  Administered 2016-03-06 (×2): 5 mL

## 2016-03-06 MED ORDER — LIP MEDEX EX OINT
1.0000 "application " | TOPICAL_OINTMENT | Freq: Two times a day (BID) | CUTANEOUS | Status: DC
  Administered 2016-03-08 – 2016-03-12 (×8): 1 via TOPICAL
  Filled 2016-03-06 (×2): qty 7

## 2016-03-06 MED ORDER — ACETAMINOPHEN 650 MG RE SUPP
650.0000 mg | Freq: Four times a day (QID) | RECTAL | Status: DC | PRN
Start: 2016-03-06 — End: 2016-03-07

## 2016-03-06 MED ORDER — CEFAZOLIN SODIUM-DEXTROSE 2-4 GM/100ML-% IV SOLN
INTRAVENOUS | Status: AC
  Filled 2016-03-06: qty 100

## 2016-03-06 MED ORDER — FENTANYL CITRATE (PF) 100 MCG/2ML IJ SOLN
INTRAMUSCULAR | Status: AC | PRN
  Administered 2016-03-06: 50 ug via INTRAVENOUS
  Administered 2016-03-06 (×3): 25 ug via INTRAVENOUS

## 2016-03-06 MED ORDER — DIPHENHYDRAMINE HCL 50 MG/ML IJ SOLN
12.5000 mg | Freq: Four times a day (QID) | INTRAMUSCULAR | Status: DC | PRN
  Administered 2016-03-07 – 2016-03-12 (×2): 25 mg via INTRAVENOUS
  Administered 2016-03-14: 12.5 mg via INTRAVENOUS
  Filled 2016-03-06 (×3): qty 1

## 2016-03-06 MED ORDER — DIATRIZOATE MEGLUMINE & SODIUM 66-10 % PO SOLN
90.0000 mL | Freq: Once | ORAL | Status: AC
  Administered 2016-03-07: 90 mL via NASOGASTRIC
  Filled 2016-03-06: qty 90

## 2016-03-06 MED ORDER — SODIUM CHLORIDE 0.9 % IV BOLUS (SEPSIS)
1000.0000 mL | Freq: Once | INTRAVENOUS | Status: AC
  Administered 2016-03-06: 1000 mL via INTRAVENOUS

## 2016-03-06 MED ORDER — LACTATED RINGERS IV BOLUS (SEPSIS): 1000.0000 mL | Freq: Once | INTRAVENOUS | Status: DC

## 2016-03-06 MED ORDER — LIDOCAINE HCL (PF) 1 % IJ SOLN
INTRAMUSCULAR | Status: AC
  Filled 2016-03-06: qty 30

## 2016-03-06 MED ORDER — ONDANSETRON HCL 4 MG/2ML IJ SOLN
INTRAMUSCULAR | Status: AC
  Administered 2016-03-06: 4 mg via INTRAVENOUS
  Filled 2016-03-06: qty 2

## 2016-03-06 MED ORDER — LACTATED RINGERS IV BOLUS (SEPSIS): 1000.0000 mL | Freq: Three times a day (TID) | INTRAVENOUS | Status: DC | PRN

## 2016-03-06 MED ORDER — PROCHLORPERAZINE EDISYLATE 5 MG/ML IJ SOLN
5.0000 mg | INTRAMUSCULAR | Status: DC | PRN
  Administered 2016-03-13: 10 mg via INTRAVENOUS
  Filled 2016-03-06: qty 2

## 2016-03-06 MED ORDER — METHOCARBAMOL 1000 MG/10ML IJ SOLN
1000.0000 mg | Freq: Four times a day (QID) | INTRAVENOUS | Status: DC | PRN
  Filled 2016-03-06: qty 10

## 2016-03-06 MED ORDER — ONDANSETRON HCL 4 MG/2ML IJ SOLN
INTRAMUSCULAR | Status: AC
  Administered 2016-03-06: 4 mg
  Filled 2016-03-06: qty 2

## 2016-03-06 MED ORDER — ACETAMINOPHEN 325 MG PO TABS
650.0000 mg | ORAL_TABLET | Freq: Four times a day (QID) | ORAL | Status: DC | PRN
Start: 2016-03-06 — End: 2016-03-07

## 2016-03-06 MED ORDER — MENTHOL 3 MG MT LOZG
1.0000 | LOZENGE | OROMUCOSAL | Status: DC | PRN
  Filled 2016-03-06: qty 9

## 2016-03-06 MED ORDER — MORPHINE SULFATE (PF) 4 MG/ML IV SOLN: 4.0000 mg | Freq: Once | INTRAVENOUS | Status: DC

## 2016-03-06 MED ORDER — ALUM & MAG HYDROXIDE-SIMETH 200-200-20 MG/5ML PO SUSP
30.0000 mL | Freq: Four times a day (QID) | ORAL | Status: DC | PRN
Start: 2016-03-06 — End: 2016-03-06

## 2016-03-06 MED ORDER — BISACODYL 10 MG RE SUPP: 10.0000 mg | Freq: Every day | RECTAL | Status: DC

## 2016-03-06 MED ORDER — HEPARIN SOD (PORK) LOCK FLUSH 100 UNIT/ML IV SOLN
INTRAVENOUS | Status: AC
  Filled 2016-03-06: qty 5

## 2016-03-06 MED ORDER — DEXTROSE-NACL 5-0.9 % IV SOLN
INTRAVENOUS | Status: AC
  Administered 2016-03-07: 11:00:00 via INTRAVENOUS

## 2016-03-06 MED ORDER — CEFAZOLIN SODIUM-DEXTROSE 2-4 GM/100ML-% IV SOLN
2.0000 g | INTRAVENOUS | Status: AC
  Administered 2016-03-06: 2 g via INTRAVENOUS

## 2016-03-06 MED ORDER — ONDANSETRON HCL 4 MG/2ML IJ SOLN
4.0000 mg | Freq: Once | INTRAMUSCULAR | Status: AC
  Administered 2016-03-06: 4 mg via INTRAVENOUS
  Filled 2016-03-06: qty 2

## 2016-03-06 MED ORDER — MIDAZOLAM HCL 2 MG/2ML IJ SOLN
INTRAMUSCULAR | Status: AC
Start: 2016-03-06 — End: 2016-03-06
  Filled 2016-03-06: qty 4

## 2016-03-06 MED ORDER — ONDANSETRON HCL 4 MG/2ML IJ SOLN: 4.0000 mg | Freq: Four times a day (QID) | INTRAMUSCULAR | Status: DC | PRN

## 2016-03-06 MED ORDER — SODIUM CHLORIDE 0.9 % IJ SOLN
INTRAMUSCULAR | Status: AC
  Filled 2016-03-06: qty 50

## 2016-03-06 MED ORDER — CARBOXYMETHYLCELL-HYPROMELLOSE 0.25-0.3 % OP GEL: 1.0000 [drp] | Freq: Every day | OPHTHALMIC | Status: DC

## 2016-03-06 NOTE — Sedation Documentation (Signed)
Patient is resting comfortably. 

## 2016-03-06 NOTE — Progress Notes (Addendum)
Emesis x 2 small amount, dark amber liquid, zofran has been given x 2 Dr Anselm Pancoast was contacted again and he will bring a script to go home with for nausea.  1557 script for zofran given to pt.

## 2016-03-06 NOTE — Discharge Instructions (Signed)
Peritoneal Dialysis Catheter Placement Peritoneal dialysis catheter placement is a surgery to insert a thin, flexible tube (catheter) in your abdomen. This surgery must be done before you begin peritoneal dialysis. Dialysis is a treatment that replaces some of the work that healthy kidneys do. During dialysis, wastes, salt, and extra water are removed from the blood. In peritoneal dialysis, these tasks are performed by transferring a fluid called dialysate to and from your abdomen during each session. The fluid goes through the catheter to enter the abdomen at the start of each dialysis session, and it drains out of the body through the catheter at the end of each session.  The catheter will be small, soft, and easy to conceal. The surgery is usually done at least 2 weeks before you begin dialysis. LET [facility] CARE PROVIDER KNOW ABOUT:   Any allergies you have.   All medicines you are taking, including vitamins, herbs, eye drops, creams, and over-the-counter medicines.  Use of steroids (by mouth or as creams).   Previous problems you or members of your family have had with the use of anesthetics.  Any blood disorders you have.  Previous surgeries you have had.   Smoking history.   Possibility of pregnancy, if this applies.   Medical conditions you have. RISKS AND COMPLICATIONS Generally, peritoneal dialysis catheter placement is a safe procedure. However, as with any procedure, complications can occur. Possible complications include:  Excessive bleeding.   Pain.   A collection of blood near the incision (hematoma).   Infection.   Slow healing.   Catheter problems after the surgery, such as the catheter becoming blocked, the catheter moving out of place, the catheter poking into or wrapping around intestines, or fluid leaking around the catheter.   Scarring.   Skin damage.   Damage to blood vessels, tissues, or organs (such as the intestines) in the abdomen  area.  BEFORE THE PROCEDURE  Ask your health care provider about changing or stopping your regular medicines. You may need to stop taking certain medicines up to 2 weeks before the procedure.  Do not eat or drink anything for at least 8 hours before the procedure.   You may be asked to wash with an antibacterial soap the night before or the morning of the procedure.  Make plans to have someone drive you home after the procedure or after your hospital stay. Ask your health care provider whether you should expect to stay in the hospital overnight or go home the same day. PROCEDURE The surgeon may use either an open or laparoscopic technique for this surgery.  Open surgery.  You will be given a medicine to make you sleep through the procedure (general anesthetic).  Once you are asleep, the surgeon will make a small cut (incision) in your abdomen.  The catheter will be put in place.  The incision will be closed with stitches or staples.  Laparoscopic surgery.  You will be given a medicine to numb the site selected for the incision (local anesthetic).  When the area is numb, the surgeon will make a small incision in your abdomen.  A thin, lighted tube with a tiny camera on the end (laparoscope) will be put through the incision. The camera sends pictures to a video screen in the operating room. This lets the surgeon see inside the abdomen during the procedure.  The catheter will be put in place.  The incision will be closed with stitches or staples. AFTER THE PROCEDURE  You will be  monitored closely in a recovery area. You may feel groggy from the anesthetic.  You may have some pain. If so, you will be given pain medicine.  You may be able to go home the same day as the procedure, or you may need to stay in the hospital overnight.  You will be given instructions on how to care for the catheter and how it is used for the dialysis process.  Your body will heal around the  catheter to hold it in place. This information is not intended to replace advice given to you by your health care provider. Make sure you discuss any questions you have with your health care provider. Document Released: 01/09/2009 Document Revised: 09/23/2012 Document Reviewed: 07/13/2012 Elsevier Interactive Patient Education  2017 [employer] An implanted port is a type of central line that is placed under the skin. Central lines are used to provide IV access when treatment or nutrition needs to be given through a person's veins. Implanted ports are used for long-term IV access. An implanted port may be placed because:   You need IV medicine that would be irritating to the small veins in your hands or arms.   You need long-term IV medicines, such as antibiotics.   You need IV nutrition for a long period.   You need frequent blood draws for lab tests.   You need dialysis.  Implanted ports are usually placed in the chest area, but they can also be placed in the upper arm, the abdomen, or the leg. An implanted port has two main parts:   Reservoir. The reservoir is round and will appear as a small, raised area under your skin. The reservoir is the part where a needle is inserted to give medicines or draw blood.   Catheter. The catheter is a thin, flexible tube that extends from the reservoir. The catheter is placed into a large vein. Medicine that is inserted into the reservoir goes into the catheter and then into the vein.  HOW WILL I CARE FOR MY INCISION SITE? Do not get the incision site wet. Bathe or shower as directed by your health care provider.  HOW IS MY PORT ACCESSED? Special steps must be taken to access the port:   Before the port is accessed, a numbing cream can be placed on the skin. This helps numb the skin over the port site.   Your health care provider uses a sterile technique to access the port.  Your health care provider must put on  a mask and sterile gloves.  The skin over your port is cleaned carefully with an antiseptic and allowed to dry.  The port is gently pinched between sterile gloves, and a needle is inserted into the port.  Only "non-coring" port needles should be used to access the port. Once the port is accessed, a blood return should be checked. This helps ensure that the port is in the vein and is not clogged.   If your port needs to remain accessed for a constant infusion, a clear (transparent) bandage will be placed over the needle site. The bandage and needle will need to be changed every week, or as directed by your health care provider.   Keep the bandage covering the needle clean and dry. Do not get it wet. Follow your health care provider's instructions on how to take a shower or bath while the port is accessed.   If your port does not need to stay accessed, no  bandage is needed over the port.  WHAT IS FLUSHING? Flushing helps keep the port from getting clogged. Follow your health care provider's instructions on how and when to flush the port. Ports are usually flushed with saline solution or a medicine called heparin. The need for flushing will depend on how the port is used.   If the port is used for intermittent medicines or blood draws, the port will need to be flushed:   After medicines have been given.   After blood has been drawn.   As part of routine maintenance.   If a constant infusion is running, the port may not need to be flushed.  HOW LONG WILL MY PORT STAY IMPLANTED? The port can stay in for as long as your health care provider thinks it is needed. When it is time for the port to come out, surgery will be done to remove it. The procedure is similar to the one performed when the port was put in.  WHEN SHOULD I SEEK IMMEDIATE MEDICAL CARE? When you have an implanted port, you should seek immediate medical care if:   You notice a bad smell coming from the incision site.    You have swelling, redness, or drainage at the incision site.   You have more swelling or pain at the port site or the surrounding area.   You have a fever that is not controlled with medicine. This information is not intended to replace advice given to you by your health care provider. Make sure you discuss any questions you have with your health care provider. Document Released: 01/21/2005 Document Revised: 11/11/2012 Document Reviewed: 09/28/2012 Elsevier Interactive Patient Education  2017 [employer].

## 2016-03-06 NOTE — ED Provider Notes (Signed)
[facility] DEPT Provider Note   CSN: [id number] Arrival date & time: 03/06/16  1632     History   Chief Complaint Chief Complaint  Patient presents with  . Abdominal Pain    HPI Matthew Waller is a 71 y.o. male.  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 Patient last had a bowel movement approximately 1 week in 2 days ago. He states that since then, he has had progressively worsening abdominal pain, distention, and cramp-like abdominal pain. He has been taking stool softeners without any improvement or bowel movements. He has had persistent vomiting over the last 48 hours. He went and did have his paracentesis drain placed today by interventional radiology. He is noted to vomit then but was given Zofran with mild improvement. He went home, called his doctor, and took a high dose of MiraLAX. Since then, has had profuse feculent and ileus vomiting. He has been unable to eat or drink much for the last several days.  Past Medical History:  Diagnosis Date  . Abdominal distension 02/2016    Patient Active Problem List   Diagnosis Date Noted  . 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 Small bowel obstruction in setting of carcinomatosis 03/06/2016  . SBO (small bowel obstruction) 03/06/2016  . Goals of care, counseling/discussion 03/06/2016  . Deviated nasal septum 03/06/2016  . Malignant ascites 03/01/2016  . 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 Abdominal pain 02/26/2016  . Transaminitis 02/26/2016  . Dysuria 02/26/2016  . Blepharitis of both eyes 02/12/2012  . Droopy eyelid 02/12/2012  . Bilateral dry eyes 02/12/2012    Past Surgical History:  Procedure Laterality Date  . COSMETIC SURGERY     eyebrows  . DENTAL SURGERY    . ERCP N/A 02/28/2016   Procedure:  ENDOSCOPIC RETROGRADE CHOLANGIOPANCREATOGRAPHY (ERCP);  Surgeon: Carol Ada, MD;  Location: [facility] ENDOSCOPY;  Service: Endoscopy;  Laterality: N/A;  . IR GENERIC HISTORICAL  03/06/2016   IR US GUIDE VASC ACCESS RIGHT 03/06/2016 Markus Daft, MD [facility]INTER[facility]  . IR GENERIC HISTORICAL  03/06/2016   IR IMAGE GUIDED DRAINAGE PERCUT CATH  PERITONEAL RETROPERIT 03/06/2016 Markus Daft, MD [facility]  . IR GENERIC HISTORICAL  03/06/2016   IR FLUORO GUIDE PORT INSERTION RIGHT 03/06/2016 Markus Daft, MD [facility]       Home Medications    Prior to Admission medications   Medication Sig Start Date End Date Taking? Authorizing Provider  Carboxymethylcell-Hypromellose (GENTEAL OP) Place 1 drop into both eyes daily.   Yes Historical Provider, MD  Carboxymethylcell-Hypromellose (GENTEAL) 0.25-0.3 % GEL Place 1 drop into both eyes at bedtime.   Yes Historical Provider, MD  magnesium citrate SOLN Take 1 Bottle by mouth once.   Yes Historical Provider, MD  mirtazapine (REMERON) 15 MG tablet Take 1 tablet (15 mg total) by mouth at bedtime. 03/05/16  Yes Truitt Merle, MD  oxyCODONE (ROXICODONE) 5 MG immediate release tablet Take 1 tablet (5 mg total) by mouth every 6 (six) hours as needed for severe pain. 03/01/16  Yes Nishant Dhungel, MD  polyethylene glycol (MIRALAX / GLYCOLAX) packet Take 17 g by mouth daily as needed. Patient taking differently: Take 17 g by mouth daily as needed for moderate constipation.  03/01/16  Yes Nishant Dhungel, MD  Wheat Dextrin (BENEFIBER DRINK MIX PO) Take 1 scoop by  mouth daily.   Yes Historical Provider, MD  zolpidem (AMBIEN) 5 MG tablet Take 1 tablet (5 mg total) by mouth at bedtime as needed for sleep. 03/01/16  Yes Nishant Dhungel, MD  Ginger, Zingiber officinalis, (GINGER ROOT) 550 MG CAPS Take 550 mg by mouth daily.    Historical Provider, MD  GLUCOSAMINE-CHONDROITIN PO Take 2 capsules by mouth daily.    Historical Provider, MD  Lactulose 20 GM/30ML SOLN Take 30 mLs (20 g total) by  mouth every 2 (two) hours as needed. Patient taking differently: Take 30 mLs by mouth every 2 (two) hours as needed (constipation).  03/05/16   Truitt Merle, MD  loratadine (CLARITIN) 10 MG tablet Take 10 mg by mouth daily.    Historical Provider, MD  Omega-3 Fatty Acids (FISH OIL) 1000 MG CAPS Take 1,000 mg by mouth daily.    Historical Provider, MD  OVER THE COUNTER MEDICATION Take 1 capsule by mouth daily. Prostate Health    Historical Provider, MD    Family History Family History  Problem Relation Age of Onset  . Hypertension Other     Social History Social History  Substance Use Topics  . Smoking status: Former Smoker    Quit date: 1965  . Smokeless tobacco: Never Used  . Alcohol use Yes     Comment: occas     Allergies   Patient has no known allergies.   Review of Systems Review of Systems  Constitutional: Positive for fatigue. Negative for chills and fever.  HENT: Negative for congestion and rhinorrhea.   Eyes: Negative for visual disturbance.  Respiratory: Negative for cough, shortness of breath and wheezing.   Cardiovascular: Negative for chest pain and leg swelling.  Gastrointestinal: Positive for abdominal distention, abdominal pain, nausea and vomiting. Negative for diarrhea.  Genitourinary: Negative for dysuria and flank pain.  Musculoskeletal: Negative for neck pain and neck stiffness.  Skin: Negative for rash and wound.  Allergic/Immunologic: Negative for immunocompromised state.  Neurological: Negative for syncope, weakness and headaches.  All other systems reviewed and are negative.    Physical Exam Updated Vital Signs BP (!) 156/81 (BP Location: Left Arm)   Pulse (!) 44   Temp 98.1 F (36.7 C) (Oral)   Resp 18   Ht 6\' 3"  (1.905 m)   Wt 230 lb (104.3 kg)   SpO2 94%   BMI 28.75 kg/m   Physical Exam  Constitutional: He is oriented to person, place, and time. He appears well-developed and well-nourished. No distress.  HENT:  Head: Normocephalic  and atraumatic.  Eyes: Conjunctivae are normal.  Neck: Neck supple.  Cardiovascular: Normal rate, regular rhythm and normal heart sounds.  Exam reveals no friction rub.   No murmur heard. Pulmonary/Chest: Effort normal and breath sounds normal. No respiratory distress. He has no wheezes. He has no rales.  Abdominal: Soft. He exhibits distension. There is tenderness.  Paracentesis Pleurx catheter is in place, with no surrounding drainage or redness. Mild erythema of left lower abdominal wall.  Musculoskeletal: He exhibits no edema.  Neurological: He is alert and oriented to person, place, and time. He exhibits normal muscle tone.  Skin: Skin is warm. Capillary refill takes less than 2 seconds.  Psychiatric: He has a normal mood and affect.  Nursing note and vitals reviewed.    ED Treatments / Results  Labs (all labs ordered are listed, but only abnormal results are displayed) Labs Reviewed  CBC - Abnormal; Notable for the following:       Result  Value   WBC 12.3 (*)    RBC 4.16 (*)    Platelets 422 (*)    All other components within normal limits  COMPREHENSIVE METABOLIC PANEL - Abnormal; Notable for the following:    Sodium 134 (*)    Chloride 91 (*)    Glucose, Bld 144 (*)    BUN 42 (*)    Creatinine, Ser 1.36 (*)    Calcium 8.5 (*)    Albumin 2.3 (*)    AST 45 (*)    GFR calc non Af Amer 50 (*)    GFR calc Af Amer 58 (*)    All other components within normal limits  GLUCOSE, CAPILLARY - Abnormal; Notable for the following:    Glucose-Capillary 142 (*)    All other components within normal limits  LIPASE, BLOOD  BASIC METABOLIC PANEL  CBC  I-STAT CG4 LACTIC ACID, ED    EKG  EKG Interpretation None       Radiology Ct Abdomen Pelvis W Contrast  Result Date: 03/06/2016 CLINICAL DATA:  Vomiting for 2 days.  Abdominal distention. EXAM: CT ABDOMEN AND PELVIS WITH CONTRAST TECHNIQUE: Multidetector CT imaging of the abdomen and pelvis was performed using the standard  protocol following bolus administration of intravenous contrast. CONTRAST:  148mL ISOVUE-300 IOPAMIDOL (ISOVUE-300) INJECTION 61% COMPARISON:  03/01/2016, 02/26/2016 FINDINGS: Lower chest: Mild linear scarring or atelectasis in the bases. No lung base nodules or consolidation. Trace left pleural effusion. Hepatobiliary: Biliary stent is satisfactorily positioned. No hepatic parenchymal masses. Pneumobilia, related to the stent. Slight residual biliary prominence, but decreased from the pre-stent study. Pancreas: Unremarkable. No pancreatic ductal dilatation or surrounding inflammatory changes. Spleen: Normal in size without focal abnormality. Adrenals/Urinary Tract: Adrenal glands are unremarkable. Kidneys are normal, without renal calculi, focal lesion, or hydronephrosis. Bladder is unremarkable. Stomach/Bowel: Marked dilatation of stomach and small bowel, new from 02/26/2016. Transition to decompressed distal ileum occurs in the right lower quadrant, axial images 46- 53 series 2. The bowel appears somewhat matted together in this area, without definition of a discrete mass. There is diffuse peritoneal enhancement and this is increased from 02/26/2016. There is moderate volume peritoneal ascites. There is diffuse stranding infiltrative opacity in the omentum. These findings most likely represent peritoneal carcinomatosis. The increased degree of peritoneal enhancement and bowel wall enhancement is fairly striking compared to 02/26/2016, suggesting rapid disease progression. Less likely possibility of infectious peritonitis not entirely excluded. Percutaneous peritoneal drainage catheter is visible anteriorly, appearing satisfactorily positioned. Vascular/Lymphatic: Aortic atherosclerosis. No enlarged abdominal or pelvic lymph nodes. Reproductive: Unremarkable Other: No extraluminal air to suggest bowel perforation. Musculoskeletal: No significant skeletal lesions. IMPRESSION: 1. New marked dilatation of stomach and  small bowel with transition to decompressed distal ileum in the right lower quadrant. Probable small bowel obstruction. A discrete mass is not visible at the transition point, but the small bowel appears somewhat matted together. 2. 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 This is worsened from 02/26/2016, suggesting rapid progression of disease. Less likely possibility of infectious peritonitis cannot be excluded. 3. Biliary stent and peritoneal drainage catheter appear satisfactorily positioned. Electronically Signed   By: Andreas Newport M.D.   On: 03/06/2016 19:01   Ir US Guide Vasc Access Right  Result Date: 03/06/2016 INDICATION: 71 year old with metastatic adenocarcinoma and malignant ascites. Patient needs a Port-A-Cath for chemotherapy and a tunneled peritoneal catheter for refractory symptomatic ascites. EXAM: FLUOROSCOPIC AND ULTRASOUND GUIDED PLACEMENT OF A SUBCUTANEOUS  PORT IMAGE GUIDED PLACEMENT OF TUNNELED PERITONEAL CATHETER COMPARISON:  None. MEDICATIONS: Ancef 2 g; The antibiotic was administered within an appropriate time interval prior to skin puncture. ANESTHESIA/SEDATION: Versed 2.5 mg IV; Fentanyl 125 mcg IV; Moderate Sedation Time:  65 minutes The patient was continuously monitored during the procedure by the interventional radiology nurse under my direct supervision. FLUOROSCOPY TIME:  36 seconds, 3 mGy COMPLICATIONS: None immediate. PROCEDURE: The procedure, risks, benefits, and alternatives were explained to the patient. Questions regarding the procedure were encouraged and answered. The patient understands and consents to the procedure. Patient was placed supine on the interventional table. Ultrasound confirmed a patent right internal jugular vein. The right chest and neck were cleaned with a skin antiseptic and a sterile drape was placed. Maximal barrier sterile technique was utilized  including caps, mask, sterile gowns, sterile gloves, sterile drape, hand hygiene and skin antiseptic. The right neck was anesthetized with 1% lidocaine. Small incision was made in the right neck with a blade. Micropuncture set was placed in the right internal jugular vein with ultrasound guidance. The micropuncture wire was used for measurement purposes. The right chest was anesthetized with 1% lidocaine with epinephrine. #15 blade was used to make an incision and a subcutaneous port pocket was formed. [place] was assembled. Subcutaneous tunnel was formed with a stiff tunneling device. The port catheter was brought through the subcutaneous tunnel. The port was placed in the subcutaneous pocket. The micropuncture set was exchanged for a peel-away sheath. The catheter was placed through the peel-away sheath and the tip was positioned at the superior cavoatrial junction. Catheter placement was confirmed with fluoroscopy. The port was accessed and flushed with heparinized saline. The port pocket was closed using two layers of absorbable sutures and Dermabond. The vein skin site was closed using a single layer of absorbable suture and Dermabond. Sterile dressings were applied. Patient tolerated the procedure well without an immediate complication. Ultrasound and fluoroscopic images were taken and saved for this procedure. Attention was directed to the peritoneal catheter placement. The right side of the abdomen was prepped and draped in sterile fashion. Ultrasound was used to confirm an adequate pocket in the right lateral abdomen. Skin was anesthetized with 1% lidocaine. Needle was directed into the peritoneal space with ultrasound guidance and clear yellow fluid was aspirated. A wire was advanced into the peritoneal cavity. The skin anterior to the peritoneal entry site was anesthetized with 1% lidocaine. Small incision was made. A subcutaneous tract was created between the 2 incisions. The PleurX  peritoneal catheter was placed through the tunnel and the cuff was placed underneath the skin. A peel-away sheath was placed over the peritoneal wire and the PleurX catheter was placed through the peel-away sheath. Catheter directed to the left side of the abdomen. Catheter was attached to suction canister and clear yellow fluid was easily draining. Peritoneal entry site was closed using absorbable suture and skin glue. Catheter was sutured to skin with Prolene suture. Dressing was placed over the incisions. Fluoroscopic and ultrasound images were taken and saved for documentation. IMPRESSION: Placement of a subcutaneous port device. Catheter tip in the superior cavoatrial junction. Successful placement of a tunneled peritoneal catheter with image guidance. Electronically Signed   By: Markus Daft M.D.   On: 03/06/2016 14:24   Ir Image Guided Drainage Percut Cath  Peritoneal Retroperit  Result Date: 03/06/2016 INDICATION: 71 year old with metastatic adenocarcinoma and malignant ascites. Patient needs a Port-A-Cath for chemotherapy and a tunneled peritoneal catheter for  refractory symptomatic ascites. EXAM: FLUOROSCOPIC AND ULTRASOUND GUIDED PLACEMENT OF A SUBCUTANEOUS PORT IMAGE GUIDED PLACEMENT OF TUNNELED PERITONEAL CATHETER COMPARISON:  None. MEDICATIONS: Ancef 2 g; The antibiotic was administered within an appropriate time interval prior to skin puncture. ANESTHESIA/SEDATION: Versed 2.5 mg IV; Fentanyl 125 mcg IV; Moderate Sedation Time:  65 minutes The patient was continuously monitored during the procedure by the interventional radiology nurse under my direct supervision. FLUOROSCOPY TIME:  36 seconds, 3 mGy COMPLICATIONS: None immediate. PROCEDURE: The procedure, risks, benefits, and alternatives were explained to the patient. Questions regarding the procedure were encouraged and answered. The patient understands and consents to the procedure. Patient was placed supine on the interventional table.  Ultrasound confirmed a patent right internal jugular vein. The right chest and neck were cleaned with a skin antiseptic and a sterile drape was placed. Maximal barrier sterile technique was utilized including caps, mask, sterile gowns, sterile gloves, sterile drape, hand hygiene and skin antiseptic. The right neck was anesthetized with 1% lidocaine. Small incision was made in the right neck with a blade. Micropuncture set was placed in the right internal jugular vein with ultrasound guidance. The micropuncture wire was used for measurement purposes. The right chest was anesthetized with 1% lidocaine with epinephrine. #15 blade was used to make an incision and a subcutaneous port pocket was formed. [place] was assembled. Subcutaneous tunnel was formed with a stiff tunneling device. The port catheter was brought through the subcutaneous tunnel. The port was placed in the subcutaneous pocket. The micropuncture set was exchanged for a peel-away sheath. The catheter was placed through the peel-away sheath and the tip was positioned at the superior cavoatrial junction. Catheter placement was confirmed with fluoroscopy. The port was accessed and flushed with heparinized saline. The port pocket was closed using two layers of absorbable sutures and Dermabond. The vein skin site was closed using a single layer of absorbable suture and Dermabond. Sterile dressings were applied. Patient tolerated the procedure well without an immediate complication. Ultrasound and fluoroscopic images were taken and saved for this procedure. Attention was directed to the peritoneal catheter placement. The right side of the abdomen was prepped and draped in sterile fashion. Ultrasound was used to confirm an adequate pocket in the right lateral abdomen. Skin was anesthetized with 1% lidocaine. Needle was directed into the peritoneal space with ultrasound guidance and clear yellow fluid was aspirated. A wire was advanced into the  peritoneal cavity. The skin anterior to the peritoneal entry site was anesthetized with 1% lidocaine. Small incision was made. A subcutaneous tract was created between the 2 incisions. The PleurX peritoneal catheter was placed through the tunnel and the cuff was placed underneath the skin. A peel-away sheath was placed over the peritoneal wire and the PleurX catheter was placed through the peel-away sheath. Catheter directed to the left side of the abdomen. Catheter was attached to suction canister and clear yellow fluid was easily draining. Peritoneal entry site was closed using absorbable suture and skin glue. Catheter was sutured to skin with Prolene suture. Dressing was placed over the incisions. Fluoroscopic and ultrasound images were taken and saved for documentation. IMPRESSION: Placement of a subcutaneous port device. Catheter tip in the superior cavoatrial junction. Successful placement of a tunneled peritoneal catheter with image guidance. Electronically Signed   By: Markus Daft M.D.   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 Patient needs a  Port-A-Cath for chemotherapy and a tunneled peritoneal catheter for refractory symptomatic ascites. EXAM: FLUOROSCOPIC AND ULTRASOUND GUIDED PLACEMENT OF A SUBCUTANEOUS PORT IMAGE GUIDED PLACEMENT OF TUNNELED PERITONEAL CATHETER COMPARISON:  None. MEDICATIONS: Ancef 2 g; The antibiotic was administered within an appropriate time interval prior to skin puncture. ANESTHESIA/SEDATION: Versed 2.5 mg IV; Fentanyl 125 mcg IV; Moderate Sedation Time:  65 minutes The patient was continuously monitored during the procedure by the interventional radiology nurse under my direct supervision. FLUOROSCOPY TIME:  36 seconds, 3 mGy COMPLICATIONS: None immediate. PROCEDURE: The procedure, risks, benefits, and alternatives were explained to the patient. Questions  regarding the procedure were encouraged and answered. The patient understands and consents to the procedure. Patient was placed supine on the interventional table. Ultrasound confirmed a patent right internal jugular vein. The right chest and neck were cleaned with a skin antiseptic and a sterile drape was placed. Maximal barrier sterile technique was utilized including caps, mask, sterile gowns, sterile gloves, sterile drape, hand hygiene and skin antiseptic. The right neck was anesthetized with 1% lidocaine. Small incision was made in the right neck with a blade. Micropuncture set was placed in the right internal jugular vein with ultrasound guidance. The micropuncture wire was used for measurement purposes. The right chest was anesthetized with 1% lidocaine with epinephrine. #15 blade was used to make an incision and a subcutaneous port pocket was formed. [place] was assembled. Subcutaneous tunnel was formed with a stiff tunneling device. The port catheter was brought through the subcutaneous tunnel. The port was placed in the subcutaneous pocket. The micropuncture set was exchanged for a peel-away sheath. The catheter was placed through the peel-away sheath and the tip was positioned at the superior cavoatrial junction. Catheter placement was confirmed with fluoroscopy. The port was accessed and flushed with heparinized saline. The port pocket was closed using two layers of absorbable sutures and Dermabond. The vein skin site was closed using a single layer of absorbable suture and Dermabond. Sterile dressings were applied. Patient tolerated the procedure well without an immediate complication. Ultrasound and fluoroscopic images were taken and saved for this procedure. Attention was directed to the peritoneal catheter placement. The right side of the abdomen was prepped and draped in sterile fashion. Ultrasound was used to confirm an adequate pocket in the right lateral abdomen. Skin was anesthetized  with 1% lidocaine. Needle was directed into the peritoneal space with ultrasound guidance and clear yellow fluid was aspirated. A wire was advanced into the peritoneal cavity. The skin anterior to the peritoneal entry site was anesthetized with 1% lidocaine. Small incision was made. A subcutaneous tract was created between the 2 incisions. The PleurX peritoneal catheter was placed through the tunnel and the cuff was placed underneath the skin. A peel-away sheath was placed over the peritoneal wire and the PleurX catheter was placed through the peel-away sheath. Catheter directed to the left side of the abdomen. Catheter was attached to suction canister and clear yellow fluid was easily draining. Peritoneal entry site was closed using absorbable suture and skin glue. Catheter was sutured to skin with Prolene suture. Dressing was placed over the incisions. Fluoroscopic and ultrasound images were taken and saved for documentation. IMPRESSION: Placement of a subcutaneous port device. Catheter tip in the superior cavoatrial junction. Successful placement of a tunneled peritoneal catheter with image guidance. Electronically Signed   By: Markus Daft M.D.   On: 03/06/2016 14:24    Procedures Procedures (including critical care time)  Medications Ordered in ED  Medications  iopamidol (ISOVUE-300) 61 % injection (not administered)  diatrizoate meglumine-sodium (GASTROGRAFIN) 66-10 % solution 90 mL (not administered)  lip balm (CARMEX) ointment 1 application (not administered)  phenol (CHLORASEPTIC) mouth spray 2 spray (not administered)  menthol-cetylpyridinium (CEPACOL) lozenge 3 mg (not administered)  magic mouthwash (not administered)  diphenhydrAMINE (BENADRYL) injection 12.5-25 mg (not administered)  ondansetron (ZOFRAN) injection 4 mg (not administered)    Or  ondansetron (ZOFRAN) 8 mg in sodium chloride 0.9 % 50 mL IVPB (not administered)  metoCLOPramide (REGLAN) injection 5-10 mg (not administered)    prochlorperazine (COMPAZINE) injection 5-10 mg (not administered)  methocarbamol (ROBAXIN) 1,000 mg in dextrose 5 % 50 mL IVPB (not administered)  polyvinyl alcohol (LIQUIFILM TEARS) 1.4 % ophthalmic solution 1 drop (not administered)  acetaminophen (TYLENOL) tablet 650 mg (not administered)    Or  acetaminophen (TYLENOL) suppository 650 mg (not administered)  ondansetron (ZOFRAN) tablet 4 mg (not administered)    Or  ondansetron (ZOFRAN) injection 4 mg (not administered)  dextrose 5 %-0.9 % sodium chloride infusion (not administered)  sodium chloride 0.9 % bolus 1,000 mL (0 mLs Intravenous Stopped 03/06/16 2057)  ondansetron (ZOFRAN) injection 4 mg (4 mg Intravenous Given 03/06/16 1715)  0.9 %  sodium chloride infusion ( Intravenous New Bag/Given 03/06/16 2218)  iopamidol (ISOVUE-300) 61 % injection 100 mL (100 mLs Intravenous Contrast Given 03/06/16 1819)     Initial Impression / Assessment and Plan / ED Course  I have reviewed the triage vital signs and the nursing notes.  Pertinent labs & imaging results that were available during my care of the patient were reviewed by me and considered in my medical decision making (see chart for details).    71 yo M with h/o recently diagnosed cholangiocarcinoma here with abdominal pain, distension, and vomiting. Labs show mild AoCKD, CT scan c/f obstruction also with worsening peritoneal carcinomatosis. D/w Dr. [name] of surgery, recommends NGT, admit to Medicine with Onc/Palliaitve discussion as pt likely non-op.  Final Clinical Impressions(s) / ED Diagnoses   Final diagnoses:  Encounter for imaging study to confirm nasogastric (NG) tube placement  Small bowel obstruction      Duffy Bruce, MD 03/07/16 [phone]

## 2016-03-06 NOTE — Consult Note (Addendum)
[address] [place], [place] [address] Phone: [phone] FAX: [phone]     Matthew Waller  10/16/1945 [id number]  CARE TEAM:  PCP: [name], MD  Outpatient Care Team: Patient Care Team: [name], MD as PCP - General (Internal Medicine) Truitt Merle, MD as Consulting Physician (Medical Oncology) Carol Ada, MD as Consulting Physician (Gastroenterology)  Inpatient Treatment Team: Treatment Team: Attending Provider: Duffy Bruce, MD; Registered Nurse: Neal Dy, RN; Technician: Tobey Grim, EMT; Consulting Physician: [name], MD; Consulting Physician: Truitt Merle, MD   This patient is a 71 y.o.male who presents today for surgical evaluation at the request of Dr Ellender Hose.   Reason for evaluation: SBO in the setting of carcinomatosis from metastatic cholangiocarcinoma  Pleasant elderly gentleman found to have jaundice and the tumor.  Biliary obstruction.  Stented.  Biopsied.  Deposits on the omentum.  Turning for primary cholangiocarcinoma.  He is.  Oncology.  Felt not to be curative but discussion made of palliative chemotherapy.  Had Port-A-Cath placed this morning in interventional radiology.  Percutaneous intraperitoneal tube so placed to help drain ascites off.  Patient's been having worsening episodes of nausea and vomiting.  A little controlled with Zofran He cannot recall having flatus for many days.  No bowel movement for nine days.  Feeling more bloated and distended and worse.  The primary care physician.  Tried more aggressive laxative regimen.  Worsening distention and nausea and vomiting.  Brown and thick.  An emergency room.  CT scan shows progression of carcinomatosis with obvious bowel obstruction.  Transition zone seems to be in right lower quadrant with bowel wall thickening.  No definite tumor deposits around the transition zone.  Assessment  Matthew Waller  71 y.o. 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  Was actually able to get past that and then had severe gag requesting and could not get it to pass the esophagus despite swallowing through a straw quite easily.  We will see if interventional radiology or fluoroscopy can have better luck tomorrow.  Suspect he would benefit from a gastrostomy tube for better palliation as I doubt this is going to get better.  There could be a window to do that in interventional radiology if they concur.    -avoid surgical intervention with carcinomatosis.  Risk of tumor deposit of wounds resulting in chronic wound and chronic ascites leak rather high.    -IVF -Palliative care consult given rapid progression by CT scan for goals of Tx & palliation. -defere palliative chemoTx vs Hospice to Oncology & PCP  -VTE prophylaxis- SCDs, etc  -mobilize as tolerated to help recovery  I updated the patient's status to the patient, spouse, and nurse.  Recommendations were made.  Questions were answered.  They expressed understanding & appreciation.     Adin Hector, M.D., F.A.C.S. Gastrointestinal and Minimally Invasive Surgery [facility] [address] [place], [place] [address] [phone]  [phone] Main / Paging   03/06/2016      Past Medical History:  Diagnosis Date  . Abdominal distension 02/2016    Past Surgical History:  Procedure Laterality Date  . COSMETIC SURGERY     eyebrows  . DENTAL SURGERY    .  ERCP N/A 02/28/2016   Procedure: ENDOSCOPIC RETROGRADE CHOLANGIOPANCREATOGRAPHY (ERCP);  Surgeon: Carol Ada, MD;  Location: [facility] ENDOSCOPY;  Service: Endoscopy;  Laterality: N/A;  . IR GENERIC HISTORICAL  03/06/2016   IR US GUIDE VASC ACCESS RIGHT 03/06/2016 Markus Daft, MD [facility]  . IR GENERIC HISTORICAL  03/06/2016   IR IMAGE GUIDED DRAINAGE PERCUT CATH  PERITONEAL RETROPERIT 03/06/2016 Markus Daft, MD [facility]  . IR GENERIC HISTORICAL  03/06/2016   IR FLUORO GUIDE PORT INSERTION RIGHT 03/06/2016 Markus Daft, MD [facility]    Social History   Social History  . Marital status: Married    Spouse name: N/A  . Number of children: N/A  . Years of education: N/A   Occupational History  . Not on file.   Social History Main Topics  . Smoking status: Former Smoker    Quit date: 1965  . Smokeless tobacco: Never Used  . Alcohol use Yes     Comment: occas  . Drug use: No  . Sexual activity: Not on file   Other Topics Concern  . Not on file   Social History Narrative  . No narrative on file    History reviewed. No pertinent family history.  Current Facility-Administered Medications  Medication Dose Route Frequency Provider Last Rate Last Dose  . 0.9 %  sodium chloride infusion   Intravenous Once Duffy Bruce, MD      . alum & mag hydroxide-simeth (MAALOX/MYLANTA) 200-200-20 MG/5ML suspension 30 mL  30 mL Oral Q6H PRN Michael Boston, MD      . bisacodyl (DULCOLAX) suppository 10 mg  10 mg Rectal Daily Michael Boston, MD      . Carboxymethylcell-Hypromellose 0.25-0.3 % GEL 1 drop  1 drop Both Eyes QHS Michael Boston, MD      . diatrizoate meglumine-sodium (GASTROGRAFIN) 66-10 % solution 90 mL  90 mL Per NG tube Once Michael Boston, MD      . diphenhydrAMINE (BENADRYL) injection 12.5-25 mg  12.5-25 mg Intravenous Q6H PRN Michael Boston, MD      . iopamidol (ISOVUE-300) 61 % injection           . lactated ringers bolus 1,000 mL  1,000 mL Intravenous Once Michael Boston, MD      . lactated  ringers bolus 1,000 mL  1,000 mL Intravenous Q8H PRN Michael Boston, MD      . lip balm (CARMEX) ointment 1 application  1 application Topical BID Michael Boston, MD      . magic mouthwash  15 mL Oral QID PRN Michael Boston, MD      . menthol-cetylpyridinium (CEPACOL) lozenge 3 mg  1 lozenge Oral PRN Michael Boston, MD      . methocarbamol (ROBAXIN) 1,000 mg in dextrose 5 % 50 mL IVPB  1,000 mg Intravenous Q6H PRN Michael Boston, MD      . metoCLOPramide (REGLAN) injection 5-10 mg  5-10 mg Intravenous Q6H PRN Michael Boston, MD      . morphine 4 MG/ML injection 4 mg  4 mg Intravenous Once Duffy Bruce, MD      . ondansetron [facility]) injection 4 mg  4 mg Intravenous Q6H PRN Michael Boston, MD  Or  . ondansetron (ZOFRAN) 8 mg in sodium chloride 0.9 % 50 mL IVPB  8 mg Intravenous Q6H PRN Michael Boston, MD      . ondansetron [facility]) injection 4 mg  4 mg Intravenous Once Duffy Bruce, MD      . ondansetron [facility]) injection 4 mg  4 mg Intravenous Once Duffy Bruce, MD      . phenol (CHLORASEPTIC) mouth spray 2 spray  2 spray Mouth/Throat PRN Michael Boston, MD      . prochlorperazine (COMPAZINE) injection 5-10 mg  5-10 mg Intravenous Q4H PRN Michael Boston, MD      . sodium chloride 0.9 % bolus 1,000 mL  1,000 mL Intravenous Once Duffy Bruce, MD      . sodium chloride 0.9 % injection            Current Outpatient Prescriptions  Medication Sig Dispense Refill  . Carboxymethylcell-Hypromellose (GENTEAL OP) Place 1 drop into both eyes daily.    . Carboxymethylcell-Hypromellose (GENTEAL) 0.25-0.3 % GEL Place 1 drop into both eyes at bedtime.    . magnesium citrate SOLN Take 1 Bottle by mouth once.    . mirtazapine (REMERON) 15 MG tablet Take 1 tablet (15 mg total) by mouth at bedtime. 30 tablet 2  . oxyCODONE (ROXICODONE) 5 MG immediate release tablet Take 1 tablet (5 mg total) by mouth every 6 (six) hours as needed for severe pain. 20 tablet 0  . polyethylene glycol (MIRALAX / GLYCOLAX) packet Take 17 g  by mouth daily as needed. (Patient taking differently: Take 17 g by mouth daily as needed for moderate constipation. ) 14 each 0  . Wheat Dextrin (BENEFIBER DRINK MIX PO) Take 1 scoop by mouth daily.    Marland Kitchen zolpidem (AMBIEN) 5 MG tablet Take 1 tablet (5 mg total) by mouth at bedtime as needed for sleep. 10 tablet 0  . Ginger, Zingiber officinalis, (GINGER ROOT) 550 MG CAPS Take 550 mg by mouth daily.    Marland Kitchen GLUCOSAMINE-CHONDROITIN PO Take 2 capsules by mouth daily.    . Lactulose 20 GM/30ML SOLN Take 30 mLs (20 g total) by mouth every 2 (two) hours as needed. (Patient taking differently: Take 30 mLs by mouth every 2 (two) hours as needed (constipation). ) 473 mL 1  . loratadine (CLARITIN) 10 MG tablet Take 10 mg by mouth daily.    . Omega-3 Fatty Acids (FISH OIL) 1000 MG CAPS Take 1,000 mg by mouth daily.    Marland Kitchen OVER THE COUNTER MEDICATION Take 1 capsule by mouth daily. Prostate Health     Facility-Administered Medications Ordered in Other Encounters  Medication Dose Route Frequency Provider Last Rate Last Dose  . 0.9 %  sodium chloride infusion   Intravenous Continuous Saverio Danker, PA-C 10 mL/hr at 03/06/16 1742    . ceFAZolin (ANCEF) 2-4 GM/100ML-% IVPB           . fentaNYL (SUBLIMAZE) 100 MCG/2ML injection           . heparin lock flush 100 UNIT/ML injection           . lidocaine (PF) (XYLOCAINE) 1 % injection           . midazolam (VERSED) 2 MG/2ML injection              No Known Allergies  ROS: Constitutional:  No fevers, chills, sweats.  Weight stable Eyes:  No vision changes, Wears glasses.  Stable vision.  No discharge HENT:  No sore throats, nasal drainage Lymph:  No neck swelling, No bruising easily Pulmonary:  No cough, productive sputum CV: No orthopnea, PND  Patient walks 30 minutes for about 1 miles without difficulty.  No exertional chest/neck/shoulder/arm pain. GI: No personal nor family history of inflammatory bowel disease, irritable bowel syndrome, allergy such as Celiac  Spr[place], dietary/dairy problems, colitis, ulcers nor gastritis.  No recent sick contacts/gastroenteritis.  No travel outside the country.  No changes in diet. Renal: No UTIs, No hematuria Genital:  No drainage, bleeding, masses Musculoskeletal: No severe joint pain.  Good ROM major joints Skin:  No sores or lesions.  No rashes Heme/Lymph:  No easy bleeding.  No swollen lymph nodes Neuro: No focal weakness/numbness.  No seizures Psych: No suicidal ideation.  No hallucinations  BP 173/88 (BP Location: Right Arm)   Pulse 85   Temp 97.8 F (36.6 C) (Oral)   Resp 18   SpO2 94%   Physical Exam: General: Pt awake/alert/oriented x4 in no major acute distress Eyes: PERRL, normal EOM. Sclera nonicteric Neuro: CN II-XII intact w/o focal sensory/motor deficits. Lymph: No head/neck/groin lymphadenopathy Psych:  No delerium/psychosis/paranoia HENT: Normocephalic, Mucus membranes moist.  No thrush Neck: Supple, No tracheal deviation Chest: No pain.  Good respiratory excursion. CV:  Pulses intact.  Regular rhythm  Abdomen: Somewhat firm.  Very distended.  Right abdominal Silastic tube with thin the bili is ascites in it.  No purulence.  No guarding.  No evidence of peritonitis. Nontender.  No incarcerated hernias.  Gen:  No inguinal hernias.  No inguinal lymphadenopathy.   Ext:  SCDs BLE.  No significant edema.  No cyanosis Skin: No petechiae / purpurea.  No major sores Musculoskeletal: No severe joint pain.  Good ROM major joints   Results:   Labs: Results for orders placed or performed during the hospital encounter of 03/06/16 (from the past 48 hour(s))  CBC     Status: Abnormal   Collection Time: 03/06/16  5:30 PM  Result Value Ref Range   WBC 12.3 (H) 4.0 - 10.5 K/uL   RBC 4.16 (L) 4.22 - 5.81 MIL/uL   Hemoglobin 14.1 13.0 - 17.0 g/dL   HCT 40.6 39.0 - 52.0 %   MCV 97.6 78.0 - 100.0 fL   MCH 33.9 26.0 - 34.0 pg   MCHC 34.7 30.0 - 36.0 g/dL   RDW 13.5 11.5 - 15.5 %   Platelets 422  (H) 150 - 400 K/uL  Comprehensive metabolic panel     Status: Abnormal   Collection Time: 03/06/16  5:30 PM  Result Value Ref Range   Sodium 134 (L) 135 - 145 mmol/L   Potassium 4.6 3.5 - 5.1 mmol/L   Chloride 91 (L) 101 - 111 mmol/L   CO2 32 22 - 32 mmol/L   Glucose, Bld 144 (H) 65 - 99 mg/dL   BUN 42 (H) 6 - 20 mg/dL   Creatinine, Ser 1.36 (H) 0.61 - 1.24 mg/dL   Calcium 8.5 (L) 8.9 - 10.3 mg/dL   Total Protein 6.7 6.5 - 8.1 g/dL   Albumin 2.3 (L) 3.5 - 5.0 g/dL   AST 45 (H) 15 - 41 U/L   ALT 36 17 - 63 U/L   Alkaline Phosphatase 107 38 - 126 U/L   Total Bilirubin 0.8 0.3 - 1.2 mg/dL   GFR calc non Af Amer 50 (L) >60 mL/min   GFR calc Af Amer 58 (L) >60 mL/min    Comment: (NOTE) The eGFR has been calculated using the CKD EPI equation. This calculation  has not been validated in all clinical situations. eGFR's persistently <60 mL/min signify possible Chronic Kidney Disease.    Anion gap 11 5 - 15  Lipase, blood     Status: None   Collection Time: 03/06/16  5:30 PM  Result Value Ref Range   Lipase 21 11 - 51 U/L  I-Stat CG4 Lactic Acid, ED     Status: None   Collection Time: 03/06/16  5:57 PM  Result Value Ref Range   Lactic Acid, Venous 1.60 0.5 - 1.9 mmol/L    Imaging / Studies: Ct Abdomen Pelvis W Contrast  Result Date: 03/06/2016 CLINICAL DATA:  Vomiting for 2 days.  Abdominal distention. EXAM: CT ABDOMEN AND PELVIS WITH CONTRAST TECHNIQUE: Multidetector CT imaging of the abdomen and pelvis was performed using the standard protocol following bolus administration of intravenous contrast. CONTRAST:  139m ISOVUE-300 IOPAMIDOL (ISOVUE-300) INJECTION 61% COMPARISON:  03/01/2016, 02/26/2016 FINDINGS: Lower chest: Mild linear scarring or atelectasis in the bases. No lung base nodules or consolidation. Trace left pleural effusion. Hepatobiliary: Biliary stent is satisfactorily positioned. No hepatic parenchymal masses. Pneumobilia, related to the stent. Slight residual biliary  prominence, but decreased from the pre-stent study. Pancreas: Unremarkable. No pancreatic ductal dilatation or surrounding inflammatory changes. Spleen: Normal in size without focal abnormality. Adrenals/Urinary Tract: Adrenal glands are unremarkable. Kidneys are normal, without renal calculi, focal lesion, or hydronephrosis. Bladder is unremarkable. Stomach/Bowel: Marked dilatation of stomach and small bowel, new from 02/26/2016. Transition to decompressed distal ileum occurs in the right lower quadrant, axial images 46- 53 series 2. The bowel appears somewhat matted together in this area, without definition of a discrete mass. There is diffuse peritoneal enhancement and this is increased from 02/26/2016. There is moderate volume peritoneal ascites. There is diffuse stranding infiltrative opacity in the omentum. These findings most likely represent peritoneal carcinomatosis. The increased degree of peritoneal enhancement and bowel wall enhancement is fairly striking compared to 02/26/2016, suggesting rapid disease progression. Less likely possibility of infectious peritonitis not entirely excluded. Percutaneous peritoneal drainage catheter is visible anteriorly, appearing satisfactorily positioned. Vascular/Lymphatic: Aortic atherosclerosis. No enlarged abdominal or pelvic lymph nodes. Reproductive: Unremarkable Other: No extraluminal air to suggest bowel perforation. Musculoskeletal: No significant skeletal lesions. IMPRESSION: 1. New marked dilatation of stomach and small bowel with transition to decompressed distal ileum in the right lower quadrant. Probable small bowel obstruction. A discrete mass is not visible at the transition point, but the small bowel appears somewhat matted together. 2. 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 This is worsened from 02/26/2016, suggesting rapid progression of disease. Less  likely possibility of infectious peritonitis cannot be excluded. 3. Biliary stent and peritoneal drainage catheter appear satisfactorily positioned. Electronically Signed   By: DAndreas NewportM.D.   On: 03/06/2016 19:01   Ct Abdomen Pelvis W Contrast  Result Date: 02/26/2016 CLINICAL DATA:  Right-sided abdominal pain. EXAM: CT ABDOMEN AND PELVIS WITH CONTRAST TECHNIQUE: Multidetector CT imaging of the abdomen and pelvis was performed using the standard protocol following bolus administration of intravenous contrast. CONTRAST:  100 mL of Isovue-300 intravenously. COMPARISON:  CT scan of Jun 16, 2003. FINDINGS: Lower chest: Mild bilateral posterior basilar subsegmental atelectasis is noted. Hepatobiliary: No gallstones are noted. Mild intrahepatic and extrahepatic biliary dilatation is noted. No focal mass or lesion is noted in the liver. The liver does appear to be slightly moved smaller with slightly nodular contours suggesting hepatic cirrhosis. Pancreas: Unremarkable. No pancreatic ductal dilatation  or surrounding inflammatory changes. Spleen: Normal in size without focal abnormality. Adrenals/Urinary Tract: Adrenal glands are unremarkable. Kidneys are normal, without renal calculi, focal lesion, or hydronephrosis. Bladder is unremarkable. Stomach/Bowel: There is no evidence of bowel obstruction. Vascular/Lymphatic: No significant vascular findings are present. No enlarged abdominal or pelvic lymph nodes. Reproductive: Mild prostatic enlargement is noted with associated calcification. Other: Moderate ascites is noted. Irregular thickening is seen involving the omentum ; is uncertain if this represents inflammation or edema, or possibly carcinomatosis. Small left fat containing inguinal hernia is noted. Musculoskeletal: No acute or significant osseous findings. IMPRESSION: Mild intrahepatic and extrahepatic biliary dilatation is noted. Correlation with liver function tests is recommended to rule out  obstruction. Findings are suggestive of possible hepatic cirrhosis. Moderate ascites is noted. Irregular thickening is seen involving the omentum ; it is uncertain if this represents inflammation or edema, or possibly carcinomatosis. Clinical correlation is recommended. Electronically Signed   By: Marijo Conception, M.D.   On: 02/26/2016 16:33   US Paracentesis  Result Date: 02/29/2016 INDICATION: Ascites of unknown etiology. Request is made for therapeutic paracentesis. EXAM: ULTRASOUND GUIDED THERAPEUTIC PARACENTESIS MEDICATIONS: 1% lidocaine. COMPLICATIONS: None immediate. PROCEDURE: Informed written consent was obtained from the patient after a discussion of the risks, benefits and alternatives to treatment. A timeout was performed prior to the initiation of the procedure. Initial ultrasound scanning demonstrates a small amount of ascites within the left upper abdominal quadrant. The left upper abdomen was prepped and draped in the usual sterile fashion. 1% lidocaine was used for local anesthesia. Following this, a 19 gauge, 7-cm, Yueh catheter was introduced. An ultrasound image was saved for documentation purposes. The paracentesis was performed. The catheter was removed and a dressing was applied. The patient tolerated the procedure well without immediate post procedural complication. FINDINGS: A total of approximately 2.9 L of yellow fluid was removed. IMPRESSION: Successful ultrasound-guided paracentesis yielding 2.9 liters of peritoneal fluid. Read by: Saverio Danker, PA-C Electronically Signed   By: Aletta Edouard M.D.   On: 02/29/2016 13:54   US Paracentesis  Result Date: 02/27/2016 INDICATION: Should with right upper quadrant abdominal pain for 2 weeks. Recent imaging shows possible cirrhosis with moderate ascites. Request is made for diagnostic and therapeutic paracentesis. EXAM: ULTRASOUND GUIDED DIAGNOSTIC AND THERAPEUTIC PARACENTESIS MEDICATIONS: 1% lidocaine COMPLICATIONS: None immediate.  PROCEDURE: Informed written consent was obtained from the patient after a discussion of the risks, benefits and alternatives to treatment. A timeout was performed prior to the initiation of the procedure. Initial ultrasound scanning demonstrates a small amount of ascites within the left lower abdominal quadrant. The left lower abdomen was prepped and draped in the usual sterile fashion. 1% lidocaine was used for local anesthesia. Following this, a 19 gauge, 7-cm, Yueh catheter was introduced. An ultrasound image was saved for documentation purposes. The paracentesis was performed. The catheter was removed and a dressing was applied. The patient tolerated the procedure well without immediate post procedural complication. FINDINGS: A total of approximately 3.7 L of yellow fluid was removed. Samples were sent to the laboratory as requested by the clinical team. IMPRESSION: Successful ultrasound-guided paracentesis yielding 3.7 liters of peritoneal fluid. Read by: Saverio Danker, PA-C Electronically Signed   By: Jerilynn Mages.  Shick M.D.   On: 02/27/2016 12:08   Ir US Guide Vasc Access Right  Result Date: 03/06/2016 INDICATION: 71 year old with metastatic adenocarcinoma and malignant ascites. Patient needs a Port-A-Cath for chemotherapy and a tunneled peritoneal catheter for refractory symptomatic ascites. EXAM: FLUOROSCOPIC AND ULTRASOUND GUIDED  PLACEMENT OF A SUBCUTANEOUS PORT IMAGE GUIDED PLACEMENT OF TUNNELED PERITONEAL CATHETER COMPARISON:  None. MEDICATIONS: Ancef 2 g; The antibiotic was administered within an appropriate time interval prior to skin puncture. ANESTHESIA/SEDATION: Versed 2.5 mg IV; Fentanyl 125 mcg IV; Moderate Sedation Time:  65 minutes The patient was continuously monitored during the procedure by the interventional radiology nurse under my direct supervision. FLUOROSCOPY TIME:  36 seconds, 3 mGy COMPLICATIONS: None immediate. PROCEDURE: The procedure, risks, benefits, and alternatives were explained to  the patient. Questions regarding the procedure were encouraged and answered. The patient understands and consents to the procedure. Patient was placed supine on the interventional table. Ultrasound confirmed a patent right internal jugular vein. The right chest and neck were cleaned with a skin antiseptic and a sterile drape was placed. Maximal barrier sterile technique was utilized including caps, mask, sterile gowns, sterile gloves, sterile drape, hand hygiene and skin antiseptic. The right neck was anesthetized with 1% lidocaine. Small incision was made in the right neck with a blade. Micropuncture set was placed in the right internal jugular vein with ultrasound guidance. The micropuncture wire was used for measurement purposes. The right chest was anesthetized with 1% lidocaine with epinephrine. #15 blade was used to make an incision and a subcutaneous port pocket was formed. [place] was assembled. Subcutaneous tunnel was formed with a stiff tunneling device. The port catheter was brought through the subcutaneous tunnel. The port was placed in the subcutaneous pocket. The micropuncture set was exchanged for a peel-away sheath. The catheter was placed through the peel-away sheath and the tip was positioned at the superior cavoatrial junction. Catheter placement was confirmed with fluoroscopy. The port was accessed and flushed with heparinized saline. The port pocket was closed using two layers of absorbable sutures and Dermabond. The vein skin site was closed using a single layer of absorbable suture and Dermabond. Sterile dressings were applied. Patient tolerated the procedure well without an immediate complication. Ultrasound and fluoroscopic images were taken and saved for this procedure. Attention was directed to the peritoneal catheter placement. The right side of the abdomen was prepped and draped in sterile fashion. Ultrasound was used to confirm an adequate pocket in the right lateral  abdomen. Skin was anesthetized with 1% lidocaine. Needle was directed into the peritoneal space with ultrasound guidance and clear yellow fluid was aspirated. A wire was advanced into the peritoneal cavity. The skin anterior to the peritoneal entry site was anesthetized with 1% lidocaine. Small incision was made. A subcutaneous tract was created between the 2 incisions. The PleurX peritoneal catheter was placed through the tunnel and the cuff was placed underneath the skin. A peel-away sheath was placed over the peritoneal wire and the PleurX catheter was placed through the peel-away sheath. Catheter directed to the left side of the abdomen. Catheter was attached to suction canister and clear yellow fluid was easily draining. Peritoneal entry site was closed using absorbable suture and skin glue. Catheter was sutured to skin with Prolene suture. Dressing was placed over the incisions. Fluoroscopic and ultrasound images were taken and saved for documentation. IMPRESSION: Placement of a subcutaneous port device. Catheter tip in the superior cavoatrial junction. Successful placement of a tunneled peritoneal catheter with image guidance. Electronically Signed   By: Markus Daft M.D.   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 A total of Versed 1.0 mg and Fentanyl 75 mcg was administered intravenously. Moderate Sedation Time: 6 minutes. The patient's level of consciousness and vital signs were monitored continuously by radiology nursing throughout the procedure under my direct supervision. FLUOROSCOPY TIME:  Fluoroscopy Time: None. COMPLICATIONS: None immediate. PROCEDURE: Informed written consent was obtained from the patient after a thorough discussion of the procedural risks, benefits  and alternatives. All questions were addressed. Maximal Sterile Barrier Technique was utilized including caps, mask, sterile gowns, sterile gloves, sterile drape, hand hygiene and skin antiseptic. A timeout was performed prior to the initiation of the procedure. Previous imaging reviewed. Patient positioned supine. Noncontrast localization CT performed. The strandy fine nodularity in the omentum was localized in the left abdomen. Overlying skin was marked. Under sterile conditions and local anesthesia, a 17 gauge 6.8 cm access needle was advanced percutaneously from an anterior approach into the abnormal left anterior omentum. Needle position confirmed with CT. 18 gauge core biopsies obtained. Samples placed in formalin. Needle removed. Postprocedure imaging demonstrates no hemorrhage or hematoma. Patient tolerated the biopsy well. IMPRESSION: Successful CT-guided left omental biopsy as above. Electronically Signed   By: Jerilynn Mages.  Shick M.D.   On: 03/01/2016 14:29   Dg Chest Port 1 View  Result Date: 02/26/2016 CLINICAL DATA:  Right middle flank pain. Abdominal distension with nausea, no vomiting. EXAM: PORTABLE CHEST 1 VIEW COMPARISON:  None. FINDINGS: 1514 hours. The heart size and mediastinal contours are normal. There is mild left-greater-than-right basilar atelectasis. No edema, confluent airspace opacity or significant pleural effusion. The bones appear unremarkable. IMPRESSION: Mild bibasilar atelectasis.  No acute cardiopulmonary process. Electronically Signed   By: Richardean Sale M.D.   On: 02/26/2016 15:29   Dg Ercp Biliary & Pancreatic Ducts  Result Date: 02/28/2016 CLINICAL DATA:  Bile duct stone.  History of right abdominal pain. EXAM: ERCP TECHNIQUE: Multiple spot images obtained with the fluoroscopic device and submitted for interpretation post-procedure. FLUOROSCOPY TIME:  Fluoroscopy Time:  2 minutes and 42 seconds Number of Acquired Spot Images: 7 COMPARISON:  Abdominal CT 02/26/2016 FINDINGS:  Cannulation and opacification of the common bile duct. Wire was advanced into the intrahepatic bile ducts. There is a focal narrowing or stricture in the proximal extrahepatic bile duct which is probably the common hepatic duct. Bile ducts are dilated proximal to this narrowing. A nonmetallic biliary stent was placed across the narrowing. IMPRESSION: Focal narrowing in the proximal extrahepatic biliary system, probably involving the common hepatic duct. Placement of biliary stent. These images were submitted for radiologic interpretation only. Please see the procedural report for the amount of contrast and the fluoroscopy time utilized. Electronically Signed   By: Markus Daft M.D.   On: 02/28/2016 15:18   Mr Abdomen Mrcp Moise Boring Contast  Result Date: 02/29/2016 CLINICAL DATA:  Abdominal pain/ distension with abnormal liver enzymes, recent ERCP showing stricture in the common hepatic duct across which a stent was placed. Hepatic morphology raises suspicion for cirrhosis. Ascites with infiltrative pattern in the omentum. EXAM: MRI ABDOMEN WITHOUT AND WITH CONTRAST (INCLUDING MRCP) TECHNIQUE: Multiplanar multisequence MR imaging of the abdomen was performed both before and after the administration of intravenous contrast. Heavily T2-weighted images of the biliary and pancreatic ducts were obtained, and three-dimensional MRCP images were rendered by post processing. CONTRAST:  20 cc MultiHance COMPARISON:  02/26/2016 FINDINGS: Lower chest: Atelectasis in the left lower lobe with subsegmental atelectasis in the right lower lobe. Small hiatal hernia. Hepatobiliary: Mildly nodular contour the liver with  morphology suggesting cirrhosis. Contracted and somewhat thick-walled gallbladder. Periportal edema. The typical 3D time-of-flight MRCP sequence was nondiagnostic due to motion artifact despite numerous attempts. Based on the other diagnostic sequences, there is minimal intrahepatic biliary dilatation extending to a  approximately 1.7 cm region of accentuated enhancement within or surrounding the common hepatic duct. There is believed to be a stent traversing this region of stricture although admittedly the stent is difficult to visualize on most sequences, but its presence is suggested on image 21 of series 4. On arterial phase images there is some faintly accentuated enhancement along the margins of the gallbladder fossa. Mild heterogeneity of enhancement in the caudate lobe but without a well-defined mass. My impression is that there is low-grade enhancement along the strictured segment of the common hepatic duct and extending into some of the intrahepatic bile ducts particularly in the left hepatic lobe and anteriorly in the right hepatic lobe, for example on images 54 through 71 of series 11404. For the most part this extends along the walls of the bile ducts rather than forming a single large mass. Mild enhancement of the distal CBD wall approaching the ampulla for example on image 94/11404. Pancreas: Mildly obscured by motion artifact. 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 No mass observed. Spleen:  Unremarkable Adrenals/Urinary Tract: Adrenal glands normal. Kidneys unremarkable. Stomach/Bowel: Unremarkable Vascular/Lymphatic: Patent celiac trunk, SMA, and single bilateral renal arteries. No pathologic adenopathy observed. Other: Upper abdominal ascites with irregular fluid infiltration of the omentum. This demonstrates abnormal reticular enhancement, and there is also thin enhancement along the peritoneal margins of the ascites. Musculoskeletal: Unremarkable IMPRESSION: 1. Along the intrahepatic bile ducts and common hepatic duct, there is abnormal enhancement of the bile duct walls. There is also some CBD enhancement near the ampulla. The appearance is abnormal and could be a manifestation of cholangitis, but given the focal stricture on prior MRCP also raises concern for cholangiocarcinoma. This is not so much masslike as  potentially infiltrative along the bile duct walls. Unfortunately, despite numerous attempts, the 3D time-of-flight MRCP images could not be obtained for further spatial resolution assessment of the biliary tree. A stent is in place and there is only minimal intrahepatic biliary dilatation. 2. Ascites. Unusual nodular infiltration of the omentum with reticular associated enhancement, potentially from neoplastic involvement or inflammation of the omentum. There is subtle diffuse enhancement along the peritoneal margins of the ascites. 3. Small hiatal hernia. 4. Pancreas divisum. Electronically Signed   By: Van Clines M.D.   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 Patient needs a Port-A-Cath for chemotherapy and a tunneled peritoneal catheter for refractory symptomatic ascites. EXAM: FLUOROSCOPIC AND ULTRASOUND GUIDED PLACEMENT OF A SUBCUTANEOUS PORT IMAGE GUIDED PLACEMENT OF TUNNELED PERITONEAL CATHETER COMPARISON:  None. MEDICATIONS: Ancef 2 g; The antibiotic was administered within an appropriate time interval prior to skin puncture. ANESTHESIA/SEDATION: Versed 2.5 mg IV; Fentanyl 125 mcg IV; Moderate Sedation Time:  65 minutes The patient was continuously monitored during the procedure by the interventional radiology nurse under my direct supervision. FLUOROSCOPY TIME:  36 seconds, 3 mGy COMPLICATIONS: None immediate. PROCEDURE: The procedure, risks, benefits, and alternatives were explained to the patient. Questions regarding the procedure were encouraged and answered. The patient understands and consents to the procedure. Patient was placed supine on the interventional table. Ultrasound confirmed a patent right internal jugular vein. The right chest and neck were cleaned with a  skin antiseptic and a sterile drape was placed. Maximal barrier sterile  technique was utilized including caps, mask, sterile gowns, sterile gloves, sterile drape, hand hygiene and skin antiseptic. The right neck was anesthetized with 1% lidocaine. Small incision was made in the right neck with a blade. Micropuncture set was placed in the right internal jugular vein with ultrasound guidance. The micropuncture wire was used for measurement purposes. The right chest was anesthetized with 1% lidocaine with epinephrine. #15 blade was used to make an incision and a subcutaneous port pocket was formed. [place] was assembled. Subcutaneous tunnel was formed with a stiff tunneling device. The port catheter was brought through the subcutaneous tunnel. The port was placed in the subcutaneous pocket. The micropuncture set was exchanged for a peel-away sheath. The catheter was placed through the peel-away sheath and the tip was positioned at the superior cavoatrial junction. Catheter placement was confirmed with fluoroscopy. The port was accessed and flushed with heparinized saline. The port pocket was closed using two layers of absorbable sutures and Dermabond. The vein skin site was closed using a single layer of absorbable suture and Dermabond. Sterile dressings were applied. Patient tolerated the procedure well without an immediate complication. Ultrasound and fluoroscopic images were taken and saved for this procedure. Attention was directed to the peritoneal catheter placement. The right side of the abdomen was prepped and draped in sterile fashion. Ultrasound was used to confirm an adequate pocket in the right lateral abdomen. Skin was anesthetized with 1% lidocaine. Needle was directed into the peritoneal space with ultrasound guidance and clear yellow fluid was aspirated. A wire was advanced into the peritoneal cavity. The skin anterior to the peritoneal entry site was anesthetized with 1% lidocaine. Small incision was made. A subcutaneous tract was created between the 2  incisions. The PleurX peritoneal catheter was placed through the tunnel and the cuff was placed underneath the skin. A peel-away sheath was placed over the peritoneal wire and the PleurX catheter was placed through the peel-away sheath. Catheter directed to the left side of the abdomen. Catheter was attached to suction canister and clear yellow fluid was easily draining. Peritoneal entry site was closed using absorbable suture and skin glue. Catheter was sutured to skin with Prolene suture. Dressing was placed over the incisions. Fluoroscopic and ultrasound images were taken and saved for documentation. IMPRESSION: Placement of a subcutaneous port device. Catheter tip in the superior cavoatrial junction. Successful placement of a tunneled peritoneal catheter with image guidance. Electronically Signed   By: Markus Daft M.D.   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 Patient needs a Port-A-Cath for chemotherapy and a tunneled peritoneal catheter for refractory symptomatic ascites. EXAM: FLUOROSCOPIC AND ULTRASOUND GUIDED PLACEMENT OF A SUBCUTANEOUS PORT IMAGE GUIDED PLACEMENT OF TUNNELED PERITONEAL CATHETER COMPARISON:  None. MEDICATIONS: Ancef 2 g; The antibiotic was administered within an appropriate time interval prior to skin puncture. ANESTHESIA/SEDATION: Versed 2.5 mg IV; Fentanyl 125 mcg IV; Moderate Sedation Time:  65 minutes The patient was continuously monitored during the procedure by the interventional radiology nurse under my direct supervision. FLUOROSCOPY TIME:  36 seconds, 3 mGy COMPLICATIONS: None immediate. PROCEDURE: The procedure, risks, benefits, and alternatives were explained to the patient. Questions regarding the procedure were encouraged and answered. The patient understands and consents to the procedure. Patient was placed supine on the interventional table. Ultrasound  confirmed a patent right internal jugular  vein. The right chest and neck were cleaned with a skin antiseptic and a sterile drape was placed. Maximal barrier sterile technique was utilized including caps, mask, sterile gowns, sterile gloves, sterile drape, hand hygiene and skin antiseptic. The right neck was anesthetized with 1% lidocaine. Small incision was made in the right neck with a blade. Micropuncture set was placed in the right internal jugular vein with ultrasound guidance. The micropuncture wire was used for measurement purposes. The right chest was anesthetized with 1% lidocaine with epinephrine. #15 blade was used to make an incision and a subcutaneous port pocket was formed. [place] was assembled. Subcutaneous tunnel was formed with a stiff tunneling device. The port catheter was brought through the subcutaneous tunnel. The port was placed in the subcutaneous pocket. The micropuncture set was exchanged for a peel-away sheath. The catheter was placed through the peel-away sheath and the tip was positioned at the superior cavoatrial junction. Catheter placement was confirmed with fluoroscopy. The port was accessed and flushed with heparinized saline. The port pocket was closed using two layers of absorbable sutures and Dermabond. The vein skin site was closed using a single layer of absorbable suture and Dermabond. Sterile dressings were applied. Patient tolerated the procedure well without an immediate complication. Ultrasound and fluoroscopic images were taken and saved for this procedure. Attention was directed to the peritoneal catheter placement. The right side of the abdomen was prepped and draped in sterile fashion. Ultrasound was used to confirm an adequate pocket in the right lateral abdomen. Skin was anesthetized with 1% lidocaine. Needle was directed into the peritoneal space with ultrasound guidance and clear yellow fluid was aspirated. A wire was advanced into the peritoneal  cavity. The skin anterior to the peritoneal entry site was anesthetized with 1% lidocaine. Small incision was made. A subcutaneous tract was created between the 2 incisions. The PleurX peritoneal catheter was placed through the tunnel and the cuff was placed underneath the skin. A peel-away sheath was placed over the peritoneal wire and the PleurX catheter was placed through the peel-away sheath. Catheter directed to the left side of the abdomen. Catheter was attached to suction canister and clear yellow fluid was easily draining. Peritoneal entry site was closed using absorbable suture and skin glue. Catheter was sutured to skin with Prolene suture. Dressing was placed over the incisions. Fluoroscopic and ultrasound images were taken and saved for documentation. IMPRESSION: Placement of a subcutaneous port device. Catheter tip in the superior cavoatrial junction. Successful placement of a tunneled peritoneal catheter with image guidance. Electronically Signed   By: Markus Daft M.D.   On: 03/06/2016 14:24   US Abdomen Limited Ruq  Result Date: 02/26/2016 CLINICAL DATA:  Acute onset of right upper quadrant abdominal pain. Initial encounter. EXAM: US ABDOMEN LIMITED - RIGHT UPPER QUADRANT COMPARISON:  CT of the abdomen and pelvis performed earlier today at 4:02 p.m. FINDINGS: Gallbladder: The gallbladder has a diffusely thick-walled appearance. This is nonspecific in the presence of ascites. No stones are seen. No ultrasonographic Murphy's sign is elicited. Common bile duct: Diameter: 0.9 cm, raising question for distal obstruction. There is suggestion of mild debris within the mid common bile duct. Liver: No focal lesion identified. Nodular contour, compatible with hepatic cirrhosis. Mild prominence of the intrahepatic biliary ducts noted. Moderate volume ascites is seen within the abdomen. IMPRESSION: 1. Findings of hepatic cirrhosis, with moderate volume ascites in the abdomen. 2. Prominence of the common bile  duct, measuring 0.9 cm, raising question for  distal obstruction. Mild prominence of the intrahepatic biliary ducts. Suggestion of underlying debris within the mid common bile duct. 3. Diffusely thick-walled gallbladder appearance is nonspecific in the presence of ascites. Gallbladder otherwise unremarkable. Electronically Signed   By: Garald Balding M.D.   On: 02/26/2016 21:40    Medications / Allergies: per chart  Antibiotics: Anti-infectives    None        Note: Portions of this report may have been transcribed using voice recognition software. Every effort was made to ensure accuracy; however, inadvertent computerized transcription errors may be present.   Any transcriptional errors that result from this process are unintentional.    Adin Hector, M.D., F.A.C.S. Gastrointestinal and Minimally Invasive [facility] [address], [place], [place] [address] [phone] Main / Paging   03/06/2016

## 2016-03-06 NOTE — Telephone Encounter (Signed)
S/w Selena Lesser and called wife back. He needs to come to [facility] ER for probable bowel obstruction, he is to be NPO until evaluated in ER. She is in car with pt and will come right away.

## 2016-03-06 NOTE — Sedation Documentation (Signed)
02 d/c 

## 2016-03-06 NOTE — ED Triage Notes (Signed)
Patient reports he was sent by his PCP for possible bowel obstruction. C/o vomiting x2 days. States last BM x9 days ago. Abdominal distention noted. 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

## 2016-03-06 NOTE — Telephone Encounter (Signed)
Pt took mag citrate and miralax bomb about 7 pm. He has been throwing up since then, 10 x vomiting  Vomitus is thick green. And multiple times reflux that he swallowed.  no BMs. He received iv Zofran in IR during port and peritoneal catheter procedure today. He was given rx for zofran. He is still vomiting any time he tries to eat or drink. He is to have a PET tomorrow.

## 2016-03-06 NOTE — H&P (Addendum)
History and Physical    Matthew Waller [id number] DOB: 07/28/1945 DOA: 03/06/2016  PCP: [name], MD  Patient coming from: Home.  Chief Complaint: Abdominal pain with nausea vomiting.  HPI: Matthew Waller is a 71 y.o. 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 Patient presented to the ER because of persistent nausea vomiting over the last 24 hours. Patient also has been having abdominal discomfort. Patient's last bowel movement was almost a week ago. CT scan of the abdomen shows features concerning for small bowel obstruction with transition point in the distal ileum with possible peritoneal carcinomatosis. On-call general surgeon Dr. [name] was consulted. Patient is being admitted for small bowel obstruction with known history of recent diagnosis of cholangiocarcinoma.  ED Course: NG tube suction was ordered.  Review of Systems: As per HPI, rest all negative.   Past Medical History:  Diagnosis Date  . Abdominal distension 02/2016    Past Surgical History:  Procedure Laterality Date  . COSMETIC SURGERY     eyebrows  . DENTAL SURGERY    . ERCP N/A 02/28/2016   Procedure: ENDOSCOPIC RETROGRADE CHOLANGIOPANCREATOGRAPHY (ERCP);  Surgeon: Carol Ada, MD;  Location: [facility] ENDOSCOPY;  Service: Endoscopy;  Laterality: N/A;  . IR GENERIC HISTORICAL  03/06/2016   IR US GUIDE VASC ACCESS RIGHT 03/06/2016 Markus Daft, MD [facility]  . IR GENERIC HISTORICAL  03/06/2016   IR IMAGE GUIDED DRAINAGE PERCUT CATH  PERITONEAL RETROPERIT 03/06/2016 Markus Daft, MD [facility]  . IR GENERIC HISTORICAL  03/06/2016   IR FLUORO GUIDE PORT INSERTION RIGHT 03/06/2016 Markus Daft, MD [facility]     reports that he quit smoking about 53 years ago. He has never used smokeless tobacco. He reports that he drinks alcohol. He reports that he does not use  drugs.  No Known Allergies  Family History  Problem Relation Age of Onset  . Hypertension Other     Prior to Admission medications   Medication Sig Start Date End Date Taking? Authorizing Provider  Carboxymethylcell-Hypromellose (GENTEAL OP) Place 1 drop into both eyes daily.   Yes Historical Provider, MD  Carboxymethylcell-Hypromellose (GENTEAL) 0.25-0.3 % GEL Place 1 drop into both eyes at bedtime.   Yes Historical Provider, MD  magnesium citrate SOLN Take 1 Bottle by mouth once.   Yes Historical Provider, MD  mirtazapine (REMERON) 15 MG tablet Take 1 tablet (15 mg total) by mouth at bedtime. 03/05/16  Yes Truitt Merle, MD  oxyCODONE (ROXICODONE) 5 MG immediate release tablet Take 1 tablet (5 mg total) by mouth every 6 (six) hours as needed for severe pain. 03/01/16  Yes Nishant Dhungel, MD  polyethylene glycol (MIRALAX / GLYCOLAX) packet Take 17 g by mouth daily as needed. Patient taking differently: Take 17 g by mouth daily as needed for moderate constipation.  03/01/16  Yes Nishant Dhungel, MD  Wheat Dextrin (BENEFIBER DRINK MIX PO) Take 1 scoop by mouth daily.   Yes Historical Provider, MD  zolpidem (AMBIEN) 5 MG tablet Take 1 tablet (5 mg total) by mouth at bedtime as needed for sleep. 03/01/16  Yes Nishant Dhungel, MD  Ginger, Zingiber officinalis, (GINGER ROOT) 550 MG CAPS Take 550 mg by mouth daily.    Historical Provider, MD  GLUCOSAMINE-CHONDROITIN PO Take 2 capsules by mouth daily.    Historical Provider, MD  Lactulose 20 GM/30ML SOLN Take 30 mLs (20 g total) by mouth every  2 (two) hours as needed. Patient taking differently: Take 30 mLs by mouth every 2 (two) hours as needed (constipation).  03/05/16   Truitt Merle, MD  loratadine (CLARITIN) 10 MG tablet Take 10 mg by mouth daily.    Historical Provider, MD  Omega-3 Fatty Acids (FISH OIL) 1000 MG CAPS Take 1,000 mg by mouth daily.    Historical Provider, MD  OVER THE COUNTER MEDICATION Take 1 capsule by mouth daily. Prostate Health     Historical Provider, MD    Physical Exam: Vitals:   03/06/16 1638 03/06/16 1809 03/06/16 2038 03/06/16 2206  BP: 134/96 173/88 145/88 (!) 156/81  Pulse: 94 85 94 (!) 44  Resp: 18 18 18 18   Temp: 97.8 F (36.6 C)   98.1 F (36.7 C)  TempSrc: Oral   Oral  SpO2: 97% 94% 93% 94%      Constitutional: Moderately built and nourished. Vitals:   03/06/16 1638 03/06/16 1809 03/06/16 2038 03/06/16 2206  BP: 134/96 173/88 145/88 (!) 156/81  Pulse: 94 85 94 (!) 44  Resp: 18 18 18 18   Temp: 97.8 F (36.6 C)   98.1 F (36.7 C)  TempSrc: Oral   Oral  SpO2: 97% 94% 93% 94%   Eyes: Anicteric no pallor. ENMT: No discharge from ears eyes nose or mouth. Neck: No mass felt. No neck rigidity. Respiratory: No rhonchi or crepitations. Cardiovascular: S1 and S2. No murmurs appreciated. Abdomen: Distended nontender daughters catheter seen. No guarding or rigidity. Bowel sounds not appreciated. Musculoskeletal: No edema. No joint effusion. Skin: No rash. Skin appears warm. Neurologic: Alert awake oriented to time place and person. Moves all extremities. Psychiatric: Appears normal. Normal affect.   Labs on Admission: I have personally reviewed following labs and imaging studies  CBC:  Recent Labs Lab 03/06/16 0922 03/06/16 1730  WBC 12.9* 12.3*  NEUTROABS 10.3*  --   HGB 14.3 14.1  HCT 41.4 40.6  MCV 98.6 97.6  PLT 429* [id number]*   Basic Metabolic Panel:  Recent Labs Lab 02/29/16 0712 03/06/16 0922 03/06/16 1730  NA 134* 132* 134*  K 4.3 4.9 4.6  CL 99* 90* 91*  CO2 25 28 32  GLUCOSE 130* 152* 144*  BUN 18 38* 42*  CREATININE 1.18 1.34* 1.36*  CALCIUM 8.3* 8.9 8.5*   GFR: Estimated Creatinine Clearance: 62.9 mL/min (by C-G formula based on SCr of 1.36 mg/dL (H)). Liver Function Tests:  Recent Labs Lab 02/29/16 [id number] 02/29/16 1414 03/06/16 1730  AST 95* 78* 45*  ALT 137* 118* 36  ALKPHOS 276* 250* 107  BILITOT 1.8* 1.0 0.8  PROT 6.6 6.1* 6.7  ALBUMIN 2.6* 2.6* 2.3*     Recent Labs Lab 03/06/16 1730  LIPASE 21   No results for input(s): AMMONIA in the last 168 hours. Coagulation Profile:  Recent Labs Lab 03/06/16 0922  INR 1.26   Cardiac Enzymes: No results for input(s): CKTOTAL, CKMB, CKMBINDEX, TROPONINI in the last 168 hours. BNP (last 3 results) No results for input(s): PROBNP in the last 8760 hours. HbA1C: No results for input(s): HGBA1C in the last 72 hours. CBG: No results for input(s): GLUCAP in the last 168 hours. Lipid Profile: No results for input(s): CHOL, HDL, LDLCALC, TRIG, CHOLHDL, LDLDIRECT in the last 72 hours. Thyroid Function Tests: No results for input(s): TSH, T4TOTAL, FREET4, T3FREE, THYROIDAB in the last 72 hours. Anemia Panel: No results for input(s): VITAMINB12, FOLATE, FERRITIN, TIBC, IRON, RETICCTPCT in the last 72 hours. Urine analysis:    Component Value  Date/Time   COLORURINE AMBER (A) 02/27/2016 0316   APPEARANCEUR CLEAR 02/27/2016 0316   LABSPEC 1.041 (H) 02/27/2016 0316   PHURINE 5.0 02/27/2016 0316   GLUCOSEU NEGATIVE 02/27/2016 0316   HGBUR NEGATIVE 02/27/2016 0316   BILIRUBINUR NEGATIVE 02/27/2016 0316   KETONESUR 5 (A) 02/27/2016 0316   PROTEINUR NEGATIVE 02/27/2016 0316   NITRITE NEGATIVE 02/27/2016 0316   LEUKOCYTESUR NEGATIVE 02/27/2016 0316   Sepsis Labs: @LABRCNTIP (procalcitonin:4,lacticidven:4) ) Recent Results (from the past 240 hour(s))  Culture, Urine     Status: None   Collection Time: 02/27/16  3:16 AM  Result Value Ref Range Status   Specimen Description URINE, CLEAN CATCH  Final   Special Requests NONE  Final   Culture NO GROWTH  Final   Report Status 02/28/2016 FINAL  Final  Gram stain     Status: None   Collection Time: 02/27/16 10:27 AM  Result Value Ref Range Status   Specimen Description FLUID PERITONEAL  Final   Special Requests NONE  Final   Gram Stain   Final    RARE WBC PRESENT, PREDOMINANTLY MONONUCLEAR NO ORGANISMS SEEN    Report Status 02/27/2016 FINAL   Final  Culture, body fluid-bottle     Status: None   Collection Time: 02/27/16 10:27 AM  Result Value Ref Range Status   Specimen Description PERITONEAL  Final   Special Requests NONE  Final   Culture NO GROWTH 5 DAYS  Final   Report Status 03/03/2016 FINAL  Final     Radiological Exams on Admission: Ct Abdomen Pelvis W Contrast  Result Date: 03/06/2016 CLINICAL DATA:  Vomiting for 2 days.  Abdominal distention. EXAM: CT ABDOMEN AND PELVIS WITH CONTRAST TECHNIQUE: Multidetector CT imaging of the abdomen and pelvis was performed using the standard protocol following bolus administration of intravenous contrast. CONTRAST:  167mL ISOVUE-300 IOPAMIDOL (ISOVUE-300) INJECTION 61% COMPARISON:  03/01/2016, 02/26/2016 FINDINGS: Lower chest: Mild linear scarring or atelectasis in the bases. No lung base nodules or consolidation. Trace left pleural effusion. Hepatobiliary: Biliary stent is satisfactorily positioned. No hepatic parenchymal masses. Pneumobilia, related to the stent. Slight residual biliary prominence, but decreased from the pre-stent study. Pancreas: Unremarkable. No pancreatic ductal dilatation or surrounding inflammatory changes. Spleen: Normal in size without focal abnormality. Adrenals/Urinary Tract: Adrenal glands are unremarkable. Kidneys are normal, without renal calculi, focal lesion, or hydronephrosis. Bladder is unremarkable. Stomach/Bowel: Marked dilatation of stomach and small bowel, new from 02/26/2016. Transition to decompressed distal ileum occurs in the right lower quadrant, axial images 46- 53 series 2. The bowel appears somewhat matted together in this area, without definition of a discrete mass. There is diffuse peritoneal enhancement and this is increased from 02/26/2016. There is moderate volume peritoneal ascites. There is diffuse stranding infiltrative opacity in the omentum. These findings most likely represent peritoneal carcinomatosis. The increased degree of peritoneal  enhancement and bowel wall enhancement is fairly striking compared to 02/26/2016, suggesting rapid disease progression. Less likely possibility of infectious peritonitis not entirely excluded. Percutaneous peritoneal drainage catheter is visible anteriorly, appearing satisfactorily positioned. Vascular/Lymphatic: Aortic atherosclerosis. No enlarged abdominal or pelvic lymph nodes. Reproductive: Unremarkable Other: No extraluminal air to suggest bowel perforation. Musculoskeletal: No significant skeletal lesions. IMPRESSION: 1. New marked dilatation of stomach and small bowel with transition to decompressed distal ileum in the right lower quadrant. Probable small bowel obstruction. A discrete mass is not visible at the transition point, but the small bowel appears somewhat matted together. 2. 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 This is worsened from 02/26/2016, suggesting rapid progression of disease. Less likely possibility of infectious peritonitis cannot be excluded. 3. Biliary stent and peritoneal drainage catheter appear satisfactorily positioned. Electronically Signed   By: Andreas Newport M.D.   On: 03/06/2016 19:01   Ir US Guide Vasc Access Right  Result Date: 03/06/2016 INDICATION: 71 year old with metastatic adenocarcinoma and malignant ascites. Patient needs a Port-A-Cath for chemotherapy and a tunneled peritoneal catheter for refractory symptomatic ascites. EXAM: FLUOROSCOPIC AND ULTRASOUND GUIDED PLACEMENT OF A SUBCUTANEOUS PORT IMAGE GUIDED PLACEMENT OF TUNNELED PERITONEAL CATHETER COMPARISON:  None. MEDICATIONS: Ancef 2 g; The antibiotic was administered within an appropriate time interval prior to skin puncture. ANESTHESIA/SEDATION: Versed 2.5 mg IV; Fentanyl 125 mcg IV; Moderate Sedation Time:  65 minutes The patient was continuously monitored during the procedure by the interventional radiology  nurse under my direct supervision. FLUOROSCOPY TIME:  36 seconds, 3 mGy COMPLICATIONS: None immediate. PROCEDURE: The procedure, risks, benefits, and alternatives were explained to the patient. Questions regarding the procedure were encouraged and answered. The patient understands and consents to the procedure. Patient was placed supine on the interventional table. Ultrasound confirmed a patent right internal jugular vein. The right chest and neck were cleaned with a skin antiseptic and a sterile drape was placed. Maximal barrier sterile technique was utilized including caps, mask, sterile gowns, sterile gloves, sterile drape, hand hygiene and skin antiseptic. The right neck was anesthetized with 1% lidocaine. Small incision was made in the right neck with a blade. Micropuncture set was placed in the right internal jugular vein with ultrasound guidance. The micropuncture wire was used for measurement purposes. The right chest was anesthetized with 1% lidocaine with epinephrine. #15 blade was used to make an incision and a subcutaneous port pocket was formed. [place] was assembled. Subcutaneous tunnel was formed with a stiff tunneling device. The port catheter was brought through the subcutaneous tunnel. The port was placed in the subcutaneous pocket. The micropuncture set was exchanged for a peel-away sheath. The catheter was placed through the peel-away sheath and the tip was positioned at the superior cavoatrial junction. Catheter placement was confirmed with fluoroscopy. The port was accessed and flushed with heparinized saline. The port pocket was closed using two layers of absorbable sutures and Dermabond. The vein skin site was closed using a single layer of absorbable suture and Dermabond. Sterile dressings were applied. Patient tolerated the procedure well without an immediate complication. Ultrasound and fluoroscopic images were taken and saved for this procedure. Attention was directed to the  peritoneal catheter placement. The right side of the abdomen was prepped and draped in sterile fashion. Ultrasound was used to confirm an adequate pocket in the right lateral abdomen. Skin was anesthetized with 1% lidocaine. Needle was directed into the peritoneal space with ultrasound guidance and clear yellow fluid was aspirated. A wire was advanced into the peritoneal cavity. The skin anterior to the peritoneal entry site was anesthetized with 1% lidocaine. Small incision was made. A subcutaneous tract was created between the 2 incisions. The PleurX peritoneal catheter was placed through the tunnel and the cuff was placed underneath the skin. A peel-away sheath was placed over the peritoneal wire and the PleurX catheter was placed through the peel-away sheath. Catheter directed to the left side of the abdomen. Catheter was attached to suction canister and clear yellow fluid was easily draining. Peritoneal entry site was closed using absorbable suture and skin glue. Catheter was sutured  to skin with Prolene suture. Dressing was placed over the incisions. Fluoroscopic and ultrasound images were taken and saved for documentation. IMPRESSION: Placement of a subcutaneous port device. Catheter tip in the superior cavoatrial junction. Successful placement of a tunneled peritoneal catheter with image guidance. Electronically Signed   By: Markus Daft M.D.   On: 03/06/2016 14:24   Ir Image Guided Drainage Percut Cath  Peritoneal Retroperit  Result Date: 03/06/2016 INDICATION: 71 year old with metastatic adenocarcinoma and malignant ascites. Patient needs a Port-A-Cath for chemotherapy and a tunneled peritoneal catheter for refractory symptomatic ascites. EXAM: FLUOROSCOPIC AND ULTRASOUND GUIDED PLACEMENT OF A SUBCUTANEOUS PORT IMAGE GUIDED PLACEMENT OF TUNNELED PERITONEAL CATHETER COMPARISON:  None. MEDICATIONS: Ancef 2 g; The antibiotic was administered within an appropriate time interval prior to skin puncture.  ANESTHESIA/SEDATION: Versed 2.5 mg IV; Fentanyl 125 mcg IV; Moderate Sedation Time:  65 minutes The patient was continuously monitored during the procedure by the interventional radiology nurse under my direct supervision. FLUOROSCOPY TIME:  36 seconds, 3 mGy COMPLICATIONS: None immediate. PROCEDURE: The procedure, risks, benefits, and alternatives were explained to the patient. Questions regarding the procedure were encouraged and answered. The patient understands and consents to the procedure. Patient was placed supine on the interventional table. Ultrasound confirmed a patent right internal jugular vein. The right chest and neck were cleaned with a skin antiseptic and a sterile drape was placed. Maximal barrier sterile technique was utilized including caps, mask, sterile gowns, sterile gloves, sterile drape, hand hygiene and skin antiseptic. The right neck was anesthetized with 1% lidocaine. Small incision was made in the right neck with a blade. Micropuncture set was placed in the right internal jugular vein with ultrasound guidance. The micropuncture wire was used for measurement purposes. The right chest was anesthetized with 1% lidocaine with epinephrine. #15 blade was used to make an incision and a subcutaneous port pocket was formed. [place] was assembled. Subcutaneous tunnel was formed with a stiff tunneling device. The port catheter was brought through the subcutaneous tunnel. The port was placed in the subcutaneous pocket. The micropuncture set was exchanged for a peel-away sheath. The catheter was placed through the peel-away sheath and the tip was positioned at the superior cavoatrial junction. Catheter placement was confirmed with fluoroscopy. The port was accessed and flushed with heparinized saline. The port pocket was closed using two layers of absorbable sutures and Dermabond. The vein skin site was closed using a single layer of absorbable suture and Dermabond. Sterile dressings were  applied. Patient tolerated the procedure well without an immediate complication. Ultrasound and fluoroscopic images were taken and saved for this procedure. Attention was directed to the peritoneal catheter placement. The right side of the abdomen was prepped and draped in sterile fashion. Ultrasound was used to confirm an adequate pocket in the right lateral abdomen. Skin was anesthetized with 1% lidocaine. Needle was directed into the peritoneal space with ultrasound guidance and clear yellow fluid was aspirated. A wire was advanced into the peritoneal cavity. The skin anterior to the peritoneal entry site was anesthetized with 1% lidocaine. Small incision was made. A subcutaneous tract was created between the 2 incisions. The PleurX peritoneal catheter was placed through the tunnel and the cuff was placed underneath the skin. A peel-away sheath was placed over the peritoneal wire and the PleurX catheter was placed through the peel-away sheath. Catheter directed to the left side of the abdomen. Catheter was attached to suction canister and clear yellow fluid was easily draining. Peritoneal entry  site was closed using absorbable suture and skin glue. Catheter was sutured to skin with Prolene suture. Dressing was placed over the incisions. Fluoroscopic and ultrasound images were taken and saved for documentation. IMPRESSION: Placement of a subcutaneous port device. Catheter tip in the superior cavoatrial junction. Successful placement of a tunneled peritoneal catheter with image guidance. Electronically Signed   By: Markus Daft M.D.   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 Patient needs a Port-A-Cath for chemotherapy and a tunneled peritoneal catheter for refractory symptomatic ascites. EXAM: FLUOROSCOPIC AND ULTRASOUND GUIDED PLACEMENT OF A SUBCUTANEOUS PORT IMAGE GUIDED PLACEMENT OF  TUNNELED PERITONEAL CATHETER COMPARISON:  None. MEDICATIONS: Ancef 2 g; The antibiotic was administered within an appropriate time interval prior to skin puncture. ANESTHESIA/SEDATION: Versed 2.5 mg IV; Fentanyl 125 mcg IV; Moderate Sedation Time:  65 minutes The patient was continuously monitored during the procedure by the interventional radiology nurse under my direct supervision. FLUOROSCOPY TIME:  36 seconds, 3 mGy COMPLICATIONS: None immediate. PROCEDURE: The procedure, risks, benefits, and alternatives were explained to the patient. Questions regarding the procedure were encouraged and answered. The patient understands and consents to the procedure. Patient was placed supine on the interventional table. Ultrasound confirmed a patent right internal jugular vein. The right chest and neck were cleaned with a skin antiseptic and a sterile drape was placed. Maximal barrier sterile technique was utilized including caps, mask, sterile gowns, sterile gloves, sterile drape, hand hygiene and skin antiseptic. The right neck was anesthetized with 1% lidocaine. Small incision was made in the right neck with a blade. Micropuncture set was placed in the right internal jugular vein with ultrasound guidance. The micropuncture wire was used for measurement purposes. The right chest was anesthetized with 1% lidocaine with epinephrine. #15 blade was used to make an incision and a subcutaneous port pocket was formed. [place] was assembled. Subcutaneous tunnel was formed with a stiff tunneling device. The port catheter was brought through the subcutaneous tunnel. The port was placed in the subcutaneous pocket. The micropuncture set was exchanged for a peel-away sheath. The catheter was placed through the peel-away sheath and the tip was positioned at the superior cavoatrial junction. Catheter placement was confirmed with fluoroscopy. The port was accessed and flushed with heparinized saline. The port pocket was closed  using two layers of absorbable sutures and Dermabond. The vein skin site was closed using a single layer of absorbable suture and Dermabond. Sterile dressings were applied. Patient tolerated the procedure well without an immediate complication. Ultrasound and fluoroscopic images were taken and saved for this procedure. Attention was directed to the peritoneal catheter placement. The right side of the abdomen was prepped and draped in sterile fashion. Ultrasound was used to confirm an adequate pocket in the right lateral abdomen. Skin was anesthetized with 1% lidocaine. Needle was directed into the peritoneal space with ultrasound guidance and clear yellow fluid was aspirated. A wire was advanced into the peritoneal cavity. The skin anterior to the peritoneal entry site was anesthetized with 1% lidocaine. Small incision was made. A subcutaneous tract was created between the 2 incisions. The PleurX peritoneal catheter was placed through the tunnel and the cuff was placed underneath the skin. A peel-away sheath was placed over the peritoneal wire and the PleurX catheter was placed through the peel-away sheath. Catheter directed to the left side of the abdomen. Catheter was attached to suction canister  and clear yellow fluid was easily draining. Peritoneal entry site was closed using absorbable suture and skin glue. Catheter was sutured to skin with Prolene suture. Dressing was placed over the incisions. Fluoroscopic and ultrasound images were taken and saved for documentation. IMPRESSION: Placement of a subcutaneous port device. Catheter tip in the superior cavoatrial junction. Successful placement of a tunneled peritoneal catheter with image guidance. Electronically Signed   By: Markus Daft M.D.   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 Small bowel obstruction - appreciate surgery consult. NG tube ordered for low intermittent suction. Patient will be kept nothing by mouth on IV fluids. Serial KUBs. Further recommendations per surgery.  2. 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 Burr Medico, oncologist in a.m.   DVT prophylaxis: SCDs. Code Status: Full code.  Family Communication: Patient's wife at the bedside.  Disposition Plan: Home.  Consults called: General surgery.  Admission status: Inpatient.    Rise Patience MD [facility] Pager [phone].  If 7PM-7AM, please contact night-coverage [email or web address] Password [id number]  03/06/2016, 11:14 PM

## 2016-03-06 NOTE — H&P (Signed)
Chief Complaint: Patient was seen in consultation today for port a cath placement and peritoneal pleurx catheter placement with paracentesis at the request of Feng,Yan  Referring Physician(s): Feng,Yan  Supervising Physician: Markus Daft  Patient Status: [facility] - Out-pt  History of Present Illness: Matthew Waller is a 71 y.o. male   New diagnosis adenocarcinoma Had noted abdominal pain worsening over several months Distension; RUQ pain; N/V/diarrhea. Dx of probable cirrhosis + ascites  1/24 paracentesis : + carcinoma with unknown primary MRCP and ERCP with stent was placed for stricture of intra and extra hepatic biliary dilatation All brushings negative Omental mass biopsy 1/26 : + adenocarcinoma  Now scheduled for [facility] and Peritoneal Pleurx catheter placement with paracentesis  Past Medical History:  Diagnosis Date  . Abdominal distension 02/2016    Past Surgical History:  Procedure Laterality Date  . COSMETIC SURGERY     eyebrows  . DENTAL SURGERY    . ERCP N/A 02/28/2016   Procedure: ENDOSCOPIC RETROGRADE CHOLANGIOPANCREATOGRAPHY (ERCP);  Surgeon: Carol Ada, MD;  Location: [facility] ENDOSCOPY;  Service: Endoscopy;  Laterality: N/A;    Allergies: Patient has no known allergies.  Medications: Prior to Admission medications   Medication Sig Start Date End Date Taking? Authorizing Provider  Carboxymethylcell-Hypromellose (GENTEAL OP) Place 1 drop into both eyes daily.    Historical Provider, MD  Carboxymethylcell-Hypromellose (GENTEAL) 0.25-0.3 % GEL Place 1 drop into both eyes at bedtime.    Historical Provider, MD  Ginger, Zingiber officinalis, (GINGER ROOT) 550 MG CAPS Take 550 mg by mouth daily.    Historical Provider, MD  GLUCOSAMINE-CHONDROITIN PO Take 2 capsules by mouth daily.    Historical Provider, MD  ibuprofen (ADVIL,MOTRIN) 200 MG tablet Take 400 mg by mouth every 6 (six) hours as needed (pain).    Historical Provider, MD  Lactulose 20 GM/30ML SOLN Take  30 mLs (20 g total) by mouth every 2 (two) hours as needed. 03/05/16   Truitt Merle, MD  loratadine (CLARITIN) 10 MG tablet Take 10 mg by mouth daily.    Historical Provider, MD  mirtazapine (REMERON) 15 MG tablet Take 1 tablet (15 mg total) by mouth at bedtime. 03/05/16   Truitt Merle, MD  Omega-3 Fatty Acids (FISH OIL) 1000 MG CAPS Take 1,000 mg by mouth daily.    Historical Provider, MD  OVER THE COUNTER MEDICATION Take 1 capsule by mouth daily. Prostate Health    Historical Provider, MD  oxyCODONE (ROXICODONE) 5 MG immediate release tablet Take 1 tablet (5 mg total) by mouth every 6 (six) hours as needed for severe pain. 03/01/16   Nishant Dhungel, MD  polyethylene glycol (MIRALAX / GLYCOLAX) packet Take 17 g by mouth daily as needed. 03/01/16   Nishant Dhungel, MD  Wheat Dextrin (BENEFIBER DRINK MIX PO) Take 1 scoop by mouth daily.    Historical Provider, MD  zolpidem (AMBIEN) 5 MG tablet Take 1 tablet (5 mg total) by mouth at bedtime as needed for sleep. 03/01/16   Louellen Molder, MD     History reviewed. No pertinent family history.  Social History   Social History  . Marital status: Married    Spouse name: N/A  . Number of children: N/A  . Years of education: N/A   Social History Main Topics  . Smoking status: Former Smoker    Quit date: 1965  . Smokeless tobacco: Never Used  . Alcohol use Yes     Comment: occas  . Drug use: No  . Sexual activity: Not  Asked   Other Topics Concern  . None   Social History Narrative  . None   Review of Systems: A 12 point ROS discussed and pertinent positives are indicated in the HPI above.  All other systems are negative.  Review of Systems  Constitutional: Positive for activity change, appetite change, fatigue and unexpected weight change. Negative for fever.  Respiratory: Positive for shortness of breath.   Cardiovascular: Negative for chest pain.  Gastrointestinal: Positive for abdominal distention, abdominal pain, constipation, nausea and  vomiting.  Musculoskeletal: Positive for gait problem.  Neurological: Positive for weakness.  Psychiatric/Behavioral: Negative for behavioral problems and confusion.    Vital Signs: BP 128/84   Pulse 81   Temp 97.6 F (36.4 C)   Resp 18   Ht 6\' 3"  (1.905 m)   Wt 227 lb (103 kg)   SpO2 95%   BMI 28.37 kg/m   Physical Exam  Constitutional: He is oriented to person, place, and time.  Cardiovascular: Normal rate, regular rhythm and normal heart sounds.   Pulmonary/Chest: Effort normal and breath sounds normal.  Abdominal: Soft. Bowel sounds are normal. He exhibits distension. There is tenderness.  Musculoskeletal: Normal range of motion.  Neurological: He is alert and oriented to person, place, and time.  Skin: Skin is warm and dry.  Psychiatric: He has a normal mood and affect. His behavior is normal. Judgment and thought content normal.  Nursing note and vitals reviewed.   Mallampati Score:  MD Evaluation Airway: WNL Heart: WNL Abdomen: WNL Chest/ Lungs: WNL ASA  Classification: 3 Mallampati/Airway Score: One  Imaging: Ct Abdomen Pelvis W Contrast  Result Date: 02/26/2016 CLINICAL DATA:  Right-sided abdominal pain. EXAM: CT ABDOMEN AND PELVIS WITH CONTRAST TECHNIQUE: Multidetector CT imaging of the abdomen and pelvis was performed using the standard protocol following bolus administration of intravenous contrast. CONTRAST:  100 mL of Isovue-300 intravenously. COMPARISON:  CT scan of Jun 16, 2003. FINDINGS: Lower chest: Mild bilateral posterior basilar subsegmental atelectasis is noted. Hepatobiliary: No gallstones are noted. Mild intrahepatic and extrahepatic biliary dilatation is noted. No focal mass or lesion is noted in the liver. The liver does appear to be slightly moved smaller with slightly nodular contours suggesting hepatic cirrhosis. Pancreas: Unremarkable. No pancreatic ductal dilatation or surrounding inflammatory changes. Spleen: Normal in size without focal  abnormality. Adrenals/Urinary Tract: Adrenal glands are unremarkable. Kidneys are normal, without renal calculi, focal lesion, or hydronephrosis. Bladder is unremarkable. Stomach/Bowel: There is no evidence of bowel obstruction. Vascular/Lymphatic: No significant vascular findings are present. No enlarged abdominal or pelvic lymph nodes. Reproductive: Mild prostatic enlargement is noted with associated calcification. Other: Moderate ascites is noted. Irregular thickening is seen involving the omentum ; is uncertain if this represents inflammation or edema, or possibly carcinomatosis. Small left fat containing inguinal hernia is noted. Musculoskeletal: No acute or significant osseous findings. IMPRESSION: Mild intrahepatic and extrahepatic biliary dilatation is noted. Correlation with liver function tests is recommended to rule out obstruction. Findings are suggestive of possible hepatic cirrhosis. Moderate ascites is noted. Irregular thickening is seen involving the omentum ; it is uncertain if this represents inflammation or edema, or possibly carcinomatosis. Clinical correlation is recommended. Electronically Signed   By: Marijo Conception, M.D.   On: 02/26/2016 16:33   US Paracentesis  Result Date: 02/29/2016 INDICATION: Ascites of unknown etiology. Request is made for therapeutic paracentesis. EXAM: ULTRASOUND GUIDED THERAPEUTIC PARACENTESIS MEDICATIONS: 1% lidocaine. COMPLICATIONS: None immediate. PROCEDURE: Informed written consent was obtained from the patient after a discussion  of the risks, benefits and alternatives to treatment. A timeout was performed prior to the initiation of the procedure. Initial ultrasound scanning demonstrates a small amount of ascites within the left upper abdominal quadrant. The left upper abdomen was prepped and draped in the usual sterile fashion. 1% lidocaine was used for local anesthesia. Following this, a 19 gauge, 7-cm, Yueh catheter was introduced. An ultrasound image was  saved for documentation purposes. The paracentesis was performed. The catheter was removed and a dressing was applied. The patient tolerated the procedure well without immediate post procedural complication. FINDINGS: A total of approximately 2.9 L of yellow fluid was removed. IMPRESSION: Successful ultrasound-guided paracentesis yielding 2.9 liters of peritoneal fluid. Read by: Saverio Danker, PA-C Electronically Signed   By: Aletta Edouard M.D.   On: 02/29/2016 13:54   US Paracentesis  Result Date: 02/27/2016 INDICATION: Should with right upper quadrant abdominal pain for 2 weeks. Recent imaging shows possible cirrhosis with moderate ascites. Request is made for diagnostic and therapeutic paracentesis. EXAM: ULTRASOUND GUIDED DIAGNOSTIC AND THERAPEUTIC PARACENTESIS MEDICATIONS: 1% lidocaine COMPLICATIONS: None immediate. PROCEDURE: Informed written consent was obtained from the patient after a discussion of the risks, benefits and alternatives to treatment. A timeout was performed prior to the initiation of the procedure. Initial ultrasound scanning demonstrates a small amount of ascites within the left lower abdominal quadrant. The left lower abdomen was prepped and draped in the usual sterile fashion. 1% lidocaine was used for local anesthesia. Following this, a 19 gauge, 7-cm, Yueh catheter was introduced. An ultrasound image was saved for documentation purposes. The paracentesis was performed. The catheter was removed and a dressing was applied. The patient tolerated the procedure well without immediate post procedural complication. FINDINGS: A total of approximately 3.7 L of yellow fluid was removed. Samples were sent to the laboratory as requested by the clinical team. IMPRESSION: Successful ultrasound-guided paracentesis yielding 3.7 liters of peritoneal fluid. Read by: Saverio Danker, PA-C Electronically Signed   By: Jerilynn Mages.  Shick M.D.   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 A total of Versed 1.0 mg and Fentanyl 75 mcg was administered intravenously. Moderate Sedation Time: 6 minutes. The patient's level of consciousness and vital signs were monitored continuously by radiology nursing throughout the procedure under my direct supervision. FLUOROSCOPY TIME:  Fluoroscopy Time: None. COMPLICATIONS: None immediate. PROCEDURE: Informed written consent was obtained from the patient after a thorough discussion of the procedural risks, benefits and alternatives. All questions were addressed. Maximal Sterile Barrier Technique was utilized including caps, mask, sterile gowns, sterile gloves, sterile drape, hand hygiene and skin antiseptic. A timeout was performed prior to the initiation of the procedure. Previous imaging reviewed. Patient positioned supine. Noncontrast localization CT performed. The strandy fine nodularity in the omentum was localized in the left abdomen. Overlying skin was marked. Under sterile conditions and local anesthesia, a 17 gauge 6.8 cm access needle was advanced percutaneously from an anterior approach into the abnormal left anterior omentum. Needle position confirmed with CT. 18 gauge core biopsies obtained. Samples placed in formalin. Needle removed. Postprocedure imaging demonstrates no hemorrhage or hematoma. Patient tolerated the biopsy well. IMPRESSION: Successful CT-guided left omental biopsy as above. Electronically Signed   By: Jerilynn Mages.  Shick M.D.   On: 03/01/2016 14:29   Dg Chest Port 1 View  Result Date: 02/26/2016 CLINICAL DATA:  Right middle flank pain. Abdominal distension with nausea, no vomiting. EXAM: PORTABLE CHEST 1 VIEW COMPARISON:  None. FINDINGS: 1514 hours. The heart size and mediastinal contours are normal. There is mild  left-greater-than-right basilar atelectasis. No edema, confluent airspace opacity or significant pleural effusion. The bones appear unremarkable. IMPRESSION: Mild bibasilar atelectasis.  No acute cardiopulmonary process. Electronically Signed   By: Richardean Sale M.D.   On: 02/26/2016 15:29   Dg Ercp Biliary & Pancreatic Ducts  Result Date: 02/28/2016 CLINICAL DATA:  Bile duct stone.  History of right abdominal pain. EXAM: ERCP TECHNIQUE: Multiple spot images obtained with the fluoroscopic device and submitted for interpretation post-procedure. FLUOROSCOPY TIME:  Fluoroscopy Time:  2 minutes and 42 seconds Number of Acquired Spot Images: 7 COMPARISON:  Abdominal CT 02/26/2016 FINDINGS: Cannulation and opacification of the common bile duct. Wire was advanced into the intrahepatic bile ducts. There is a focal narrowing or stricture in the proximal extrahepatic bile duct which is probably the common hepatic duct. Bile ducts are dilated proximal to this narrowing. A nonmetallic biliary stent was placed across the narrowing. IMPRESSION: Focal narrowing in the proximal extrahepatic biliary system, probably involving the common hepatic duct. Placement of biliary stent. These images were submitted for radiologic interpretation only. Please see the procedural report for the amount of contrast and the fluoroscopy time utilized. Electronically Signed   By: Markus Daft M.D.   On: 02/28/2016 15:18   Mr Abdomen Mrcp Moise Boring Contast  Result Date: 02/29/2016 CLINICAL DATA:  Abdominal pain/ distension with abnormal liver enzymes, recent ERCP showing stricture in the common hepatic duct across which a stent was placed. Hepatic morphology raises suspicion for cirrhosis. Ascites with infiltrative pattern in the omentum. EXAM: MRI ABDOMEN WITHOUT AND WITH CONTRAST (INCLUDING MRCP) TECHNIQUE: Multiplanar multisequence MR imaging of the abdomen was performed both before and after the administration of intravenous contrast. Heavily  T2-weighted images of the biliary and pancreatic ducts were obtained, and three-dimensional MRCP images were rendered by post processing. CONTRAST:  20 cc MultiHance COMPARISON:  02/26/2016 FINDINGS: Lower chest: Atelectasis in the left lower lobe with subsegmental atelectasis in the right lower lobe. Small hiatal hernia. Hepatobiliary: Mildly nodular contour the liver with morphology suggesting cirrhosis. Contracted and somewhat thick-walled gallbladder. Periportal edema. The typical 3D time-of-flight MRCP sequence was nondiagnostic due to motion artifact despite numerous attempts. Based on the other diagnostic sequences, there is minimal intrahepatic biliary dilatation extending to a approximately 1.7 cm region of accentuated enhancement within or surrounding the common hepatic duct. There is believed to be a stent traversing this region of stricture although admittedly the stent is difficult to visualize on most sequences, but its presence is suggested on image 21 of series 4. On arterial phase images there is some faintly accentuated enhancement along the margins of the gallbladder fossa. Mild heterogeneity of enhancement in the caudate lobe but without a well-defined mass. My impression is that there is low-grade enhancement along the strictured segment of the common hepatic duct and extending into some of the intrahepatic bile ducts particularly in the left hepatic lobe and anteriorly in the right hepatic lobe, for example on images 54 through 71 of series 11404. For the most part this extends along the walls of the bile ducts rather than forming a single large mass. Mild enhancement of the distal CBD wall approaching the ampulla for example on image 94/11404. Pancreas: Mildly obscured by motion artifact. 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 No mass observed. Spleen:  Unremarkable Adrenals/Urinary Tract: Adrenal glands  normal. Kidneys unremarkable. Stomach/Bowel: Unremarkable Vascular/Lymphatic: Patent celiac trunk,  SMA, and single bilateral renal arteries. No pathologic adenopathy observed. Other: Upper abdominal ascites with irregular fluid infiltration of the omentum. This demonstrates abnormal reticular enhancement, and there is also thin enhancement along the peritoneal margins of the ascites. Musculoskeletal: Unremarkable IMPRESSION: 1. Along the intrahepatic bile ducts and common hepatic duct, there is abnormal enhancement of the bile duct walls. There is also some CBD enhancement near the ampulla. The appearance is abnormal and could be a manifestation of cholangitis, but given the focal stricture on prior MRCP also raises concern for cholangiocarcinoma. This is not so much masslike as potentially infiltrative along the bile duct walls. Unfortunately, despite numerous attempts, the 3D time-of-flight MRCP images could not be obtained for further spatial resolution assessment of the biliary tree. A stent is in place and there is only minimal intrahepatic biliary dilatation. 2. Ascites. Unusual nodular infiltration of the omentum with reticular associated enhancement, potentially from neoplastic involvement or inflammation of the omentum. There is subtle diffuse enhancement along the peritoneal margins of the ascites. 3. Small hiatal hernia. 4. Pancreas divisum. Electronically Signed   By: Van Clines M.D.   On: 02/29/2016 08:23   US Abdomen Limited Ruq  Result Date: 02/26/2016 CLINICAL DATA:  Acute onset of right upper quadrant abdominal pain. Initial encounter. EXAM: US ABDOMEN LIMITED - RIGHT UPPER QUADRANT COMPARISON:  CT of the abdomen and pelvis performed earlier today at 4:02 p.m. FINDINGS: Gallbladder: The gallbladder has a diffusely thick-walled appearance. This is nonspecific in the presence of ascites. No stones are seen. No ultrasonographic Murphy's sign is elicited. Common bile duct: Diameter: 0.9 cm, raising question for distal obstruction. There is suggestion of mild debris within the mid common  bile duct. Liver: No focal lesion identified. Nodular contour, compatible with hepatic cirrhosis. Mild prominence of the intrahepatic biliary ducts noted. Moderate volume ascites is seen within the abdomen. IMPRESSION: 1. Findings of hepatic cirrhosis, with moderate volume ascites in the abdomen. 2. Prominence of the common bile duct, measuring 0.9 cm, raising question for distal obstruction. Mild prominence of the intrahepatic biliary ducts. Suggestion of underlying debris within the mid common bile duct. 3. Diffusely thick-walled gallbladder appearance is nonspecific in the presence of ascites. Gallbladder otherwise unremarkable. Electronically Signed   By: Garald Balding M.D.   On: 02/26/2016 21:40    Labs:  CBC:  Recent Labs  02/26/16 1424 02/27/16 0315  WBC 6.7 6.8  HGB 14.2 14.0  HCT 41.6 41.3  PLT 242 270    COAGS:  Recent Labs  02/26/16 1424  INR 1.11  APTT 29    BMP:  Recent Labs  02/26/16 1424 02/27/16 0315 02/29/16 0712  NA 135 133* 134*  K 4.1 4.2 4.3  CL 99* 101 99*  CO2 27 24 25   GLUCOSE 109* 107* 130*  BUN 11 13 18   CALCIUM 8.5* 8.5* 8.3*  CREATININE 0.99 1.02 1.18  GFRNONAA >60 >60 59*  GFRAA >60 >60 >60    LIVER FUNCTION TESTS:  Recent Labs  02/27/16 0315 02/28/16 0834 02/29/16 0712 02/29/16 1414  BILITOT 1.8* 3.0* 1.8* 1.0  AST 136* 163* 95* 78*  ALT 176* 189* 137* 118*  ALKPHOS 290* 328* 276* 250*  PROT 6.2* 5.9* 6.6 6.1*  ALBUMIN 2.9* 2.6* 2.6* 2.6*    TUMOR MARKERS:  Recent Labs  02/27/16 1526  AFPTM 1.6  CEA 1.0  CA199 80*    Assessment and Plan:  Adenocarcinoma Recurrent Malignant ascites Scheduled  for Port a cath placement And Peritoneal PleurX catheter placement with paracentesis Risks and Benefits discussed with the patient including, but not limited to bleeding, infection, pneumothorax, or fibrin sheath development and need for additional procedures. All of the patient's questions were answered, patient is  agreeable to proceed. Consent signed and in chart. Risks and Benefits discussed with the patient including bleeding, infection, damage to adjacent structures, bowel perforation/fistula connection, and sepsis. All of the patient's questions were answered, patient is agreeable to proceed. Consent signed and in chart.   Thank you for this interesting consult.  I greatly enjoyed meeting AVRI PAOLICELLI and look forward to participating in their care.  A copy of this report was sent to the requesting provider on this date.  Electronically Signed: Cheree Fowles A 03/06/2016, 9:56 AM   I spent a total of  30 Minutes   in face to face in clinical consultation, greater than 50% of which was counseling/coordinating care for [facility] and peritoneal Pleurx catheter placement

## 2016-03-06 NOTE — Procedures (Signed)
Successful placement of right IJ port.  Tip at SVC/RA junction. Successful placement of peritoneal Pleurx catheter.   No immediate complication.    Minimal blood loss See full report in Imaging.

## 2016-03-07 ENCOUNTER — Encounter (HOSPITAL_COMMUNITY): Payer: PPO

## 2016-03-07 ENCOUNTER — Inpatient Hospital Stay (HOSPITAL_COMMUNITY): Payer: PPO

## 2016-03-07 DIAGNOSIS — E860 Dehydration: Secondary | ICD-10-CM

## 2016-03-07 DIAGNOSIS — E43 Unspecified severe protein-calorie malnutrition: Secondary | ICD-10-CM | POA: Insufficient documentation

## 2016-03-07 DIAGNOSIS — R634 Abnormal weight loss: Secondary | ICD-10-CM

## 2016-03-07 DIAGNOSIS — K56609 Unspecified intestinal obstruction, unspecified as to partial versus complete obstruction: Secondary | ICD-10-CM

## 2016-03-07 DIAGNOSIS — R180 Malignant ascites: Secondary | ICD-10-CM

## 2016-03-07 DIAGNOSIS — C240 Malignant neoplasm of extrahepatic bile duct: Secondary | ICD-10-CM

## 2016-03-07 DIAGNOSIS — N179 Acute kidney failure, unspecified: Secondary | ICD-10-CM

## 2016-03-07 DIAGNOSIS — Z515 Encounter for palliative care: Secondary | ICD-10-CM

## 2016-03-07 DIAGNOSIS — R630 Anorexia: Secondary | ICD-10-CM

## 2016-03-07 DIAGNOSIS — E440 Moderate protein-calorie malnutrition: Secondary | ICD-10-CM

## 2016-03-07 DIAGNOSIS — C786 Secondary malignant neoplasm of retroperitoneum and peritoneum: Secondary | ICD-10-CM

## 2016-03-07 LAB — BASIC METABOLIC PANEL
Anion gap: 12 (ref 5–15)
BUN: 38 mg/dL — ABNORMAL HIGH (ref 6–20)
CHLORIDE: 92 mmol/L — AB (ref 101–111)
CO2: 31 mmol/L (ref 22–32)
CREATININE: 1.4 mg/dL — AB (ref 0.61–1.24)
Calcium: 8.1 mg/dL — ABNORMAL LOW (ref 8.9–10.3)
GFR calc non Af Amer: 48 mL/min — ABNORMAL LOW (ref >60)
GFR, EST AFRICAN AMERICAN: 56 mL/min — AB (ref >60)
Glucose, Bld: 135 mg/dL — ABNORMAL HIGH (ref 65–99)
Potassium: 4.6 mmol/L (ref 3.5–5.1)
Sodium: 135 mmol/L (ref 135–145)

## 2016-03-07 LAB — CBC
HEMATOCRIT: 39.3 % (ref 39.0–52.0)
HEMOGLOBIN: 13.5 g/dL (ref 13.0–17.0)
MCH: 33.8 pg (ref 26.0–34.0)
MCHC: 34.4 g/dL (ref 30.0–36.0)
MCV: 98.5 fL (ref 78.0–100.0)
PLATELETS: 330 10*3/uL (ref 150–400)
RBC: 3.99 MIL/uL — AB (ref 4.22–5.81)
RDW: 13.6 % (ref 11.5–15.5)
WBC: 12.4 10*3/uL — ABNORMAL HIGH (ref 4.0–10.5)

## 2016-03-07 LAB — GLUCOSE, CAPILLARY
GLUCOSE-CAPILLARY: 147 mg/dL — AB (ref 65–99)
Glucose-Capillary: 133 mg/dL — ABNORMAL HIGH (ref 65–99)
Glucose-Capillary: 142 mg/dL — ABNORMAL HIGH (ref 65–99)

## 2016-03-07 MED ORDER — LIDOCAINE HCL 2 % EX GEL
CUTANEOUS | Status: AC
  Filled 2016-03-07: qty 30

## 2016-03-07 MED ORDER — HYDROMORPHONE HCL 1 MG/ML IJ SOLN
0.5000 mg | INTRAMUSCULAR | Status: DC | PRN
  Administered 2016-03-07 – 2016-03-08 (×2): 0.5 mg via INTRAVENOUS
  Filled 2016-03-07 (×2): qty 1

## 2016-03-07 MED ORDER — GLYCOPYRROLATE 0.2 MG/ML IJ SOLN
0.2000 mg | INTRAMUSCULAR | Status: DC | PRN
  Filled 2016-03-07: qty 1

## 2016-03-07 MED ORDER — BUTAMBEN-TETRACAINE-BENZOCAINE 2-2-14 % EX AERO
INHALATION_SPRAY | CUTANEOUS | Status: AC
  Filled 2016-03-07: qty 20

## 2016-03-07 MED ORDER — DEXAMETHASONE SODIUM PHOSPHATE 4 MG/ML IJ SOLN
4.0000 mg | Freq: Two times a day (BID) | INTRAMUSCULAR | Status: DC
  Administered 2016-03-07 – 2016-03-09 (×4): 4 mg via INTRAVENOUS
  Filled 2016-03-07: qty 1
  Filled 2016-03-07: qty 0.4
  Filled 2016-03-07 (×2): qty 1
  Filled 2016-03-07: qty 0.4

## 2016-03-07 MED ORDER — SODIUM CHLORIDE 0.9 % IV SOLN
INTRAVENOUS | Status: DC
  Administered 2016-03-07: 23:00:00 via INTRAVENOUS

## 2016-03-07 MED ORDER — PANTOPRAZOLE SODIUM 40 MG PO TBEC: 40.0000 mg | DELAYED_RELEASE_TABLET | Freq: Every day | ORAL | Status: DC

## 2016-03-07 MED ORDER — PANTOPRAZOLE SODIUM 40 MG IV SOLR
40.0000 mg | Freq: Every day | INTRAVENOUS | Status: DC
  Administered 2016-03-07: 40 mg via INTRAVENOUS
  Filled 2016-03-07: qty 40

## 2016-03-07 MED ORDER — FAMOTIDINE IN NACL 20-0.9 MG/50ML-% IV SOLN
20.0000 mg | Freq: Two times a day (BID) | INTRAVENOUS | Status: DC
  Administered 2016-03-07 – 2016-03-09 (×4): 20 mg via INTRAVENOUS
  Filled 2016-03-07 (×6): qty 50

## 2016-03-07 MED ORDER — LORAZEPAM 2 MG/ML IJ SOLN
1.0000 mg | Freq: Every evening | INTRAMUSCULAR | Status: DC | PRN
  Administered 2016-03-07 – 2016-03-15 (×9): 1 mg via INTRAVENOUS
  Filled 2016-03-07 (×9): qty 1

## 2016-03-07 NOTE — Progress Notes (Signed)
Initial Nutrition Assessment  DOCUMENTATION CODES:   Severe malnutrition in context of acute illness/injury  INTERVENTION:   RD will order supplements when diet advanced  Consider Nutrition Support (TPN) if patient not able to advance diet within 4-5 days  NUTRITION DIAGNOSIS:   Malnutrition related to cancer and cancer related treatments, acute illness, poor appetite, vomiting, nausea as evidenced by moderate depletions of muscle mass, mild depletion of body fat, energy intake < or equal to 50% for > or equal to 5 days.  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 Patient presented to the ER because of persistent nausea vomiting over the last 24 hours. Patient also has been having abdominal discomfort. Patient's last bowel movement was almost a week ago. CT scan of the abdomen shows features concerning for small bowel obstruction with transition point in the distal ileum with possible peritoneal carcinomatosis. On-call general surgeon Dr. [name] was consulted. Patient is being admitted for small bowel obstruction with known history of recent diagnosis of cholangiocarcinoma.   Met with pt and family in room today. Pt reports poor appetite, oral intake, nausea, and vomiting for 3 days pta. Pt reports that he can't keep any food down. Pt reports that he has lost some weight but unsure of the amount. Pt with SBO. Pt had three failed attempts for NGT placement today. 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 He's had no surgeries or major illnesses until this recent  diagnosis. Pending conversation with oncology and palliative care, I think his best option would be palliative G tube in [place]. Fluoro-guided NG to start and try to decompress/ do small bowel protocol is worth a try as well. Surgical exploration would be very high risk for significant and quality-of-life limiting complications" RD will order supplements when diet advanced. Consider Nutrition support (TPN) if unable to advance patient's diet in 4-5 days.    Medications reviewed: benedryl  Labs reviewed: Cl 92(L), BUN 38(H), creat 1.4(H), Ca 8.1(L) adj. 9.46 wnl, Alb 2.3(L), AST 45(H) WBC- 12.4(H)  Nutrition-Focused physical exam completed. Findings are mild fat depletion, moderate muscle depletion, and no edema.   Diet Order:  Diet NPO time specified Diet NPO time specified  Skin:  Wound (see comment) (neck, chest, abdomen incision)  Last BM:  none since admit  Height:   Ht Readings from Last 1 Encounters:  03/06/16 6' 3"  (1.905 m)    Weight:   Wt Readings from Last 1 Encounters:  03/06/16 230 lb (104.3 kg)    Ideal Body Weight:  89 kg  BMI:  Body mass index is 28.75 kg/m.  Estimated Nutritional Needs:   Kcal:  2400-2700kcal/day   Protein:  125-146g/day   Fluid:  >2.4L/day   EDUCATION NEEDS:   No education needs identified at this time  Koleen Distance, RD, LDN Pager #[phone]

## 2016-03-07 NOTE — Progress Notes (Signed)
PROGRESS NOTE    Matthew Waller  [id number] DOB: 19-Aug-1945 DOA: 03/06/2016 PCP: [name], MD     Brief Narrative:  Matthew Waller is a 71 y.o. 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 Patient presented to the ER because of persistent nausea vomiting over the last 24 hours. Patient also has been having abdominal discomfort. Patient's last bowel movement was almost a week ago. CT scan of the abdomen shows features concerning for small bowel obstruction with transition point in the distal ileum with possible peritoneal carcinomatosis. On-call general surgeon Dr. [name] was consulted. Patient admitted for small bowel obstruction with known history of recent diagnosis of cholangiocarcinoma.   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[place])   SBO (small bowel obstruction)   Deviated nasal septum   Protein-calorie malnutrition, severe  SBO in setting of cholangiocarcinoma with peritoneal carcinomatosis, malignant ascites  -Peritoneal Pleurx catheter and right IJ chemo port placed by IR as outpatient 1/31  -Oncology, General surgery following -Appreciate palliative care  -Fluoro-guided NG vs palliative G tube for bowel decompression  Severe malnutrition in context of acute illness/injury  -Appreciate RD  -NPO for now due to SBO    DVT prophylaxis: SCDs Code Status: Full Family Communication: at bedside Disposition Plan: pending further improvement, stabilization   Consultants:   Oncology  General Surgery  Palliative care   Procedures:   None  Antimicrobials:   None     Subjective: Patient's main complaint  is lack of sleep. He is having a hard time processing the events over the past 1-2 weeks. Has some nausea and abdominal pain as well as abdominal distention. Has not had a BM in 10 days.    Objective: Vitals:   03/06/16 1809 03/06/16 2038 03/06/16 2206 03/07/16 0402  BP: 173/88 145/88 (!) 156/81 (!) 156/87  Pulse: 85 94 (!) 44 91  Resp: 18 18 18 19   Temp:   98.1 F (36.7 C) 98.2 F (36.8 C)  TempSrc:   Oral Oral  SpO2: 94% 93% 94% 94%  Weight:   104.3 kg (230 lb)   Height:   6\' 3"  (1.905 m)     Intake/Output Summary (Last 24 hours) at 03/07/16 1406 Last data filed at 03/06/16 2057  Gross per 24 hour  Intake             1000 ml  Output                0 ml  Net             1000 ml   Filed Weights   03/06/16 2206  Weight: 104.3 kg (230 lb)    Examination:  General exam: Appears calm, ill-appearing  Respiratory system: Clear to auscultation. Respiratory effort normal. Cardiovascular system: S1 & S2 heard, RRR. No JVD, murmurs, rubs, gallops or clicks. No pedal edema. Gastrointestinal system: Abdomen is distended but soft  Central nervous system: Alert and oriented. No focal neurological deficits. Extremities: Symmetric 5 x 5 power. Skin: No rashes, lesions or ulcers Psychiatry: Judgement and insight appear normal. Mood & affect appropriate.   Data Reviewed: I have personally reviewed following labs and imaging studies  CBC:  Recent Labs Lab 03/06/16 0922 03/06/16 1730 03/07/16 0537  WBC 12.9* 12.3* 12.4*  NEUTROABS 10.3*  --   --   HGB 14.3 14.1 13.5  HCT 41.4 40.6 39.3  MCV 98.6 97.6 98.5  PLT 429* 422* [id number]   Basic Metabolic Panel:  Recent Labs Lab 03/06/16 0922 03/06/16 1730 03/07/16 0537  NA 132* 134* 135  K 4.9 4.6 4.6  CL 90* 91* 92*  CO2 28 32 31  GLUCOSE 152* 144* 135*  BUN 38* 42* 38*  CREATININE 1.34* 1.36* 1.40*  CALCIUM 8.9 8.5* 8.1*   GFR: Estimated Creatinine Clearance: 61.4 mL/min (by C-G formula based on SCr of 1.4 mg/dL (H)). Liver  Function Tests:  Recent Labs Lab 02/29/16 1414 03/06/16 1730  AST 78* 45*  ALT 118* 36  ALKPHOS 250* 107  BILITOT 1.0 0.8  PROT 6.1* 6.7  ALBUMIN 2.6* 2.3*    Recent Labs Lab 03/06/16 1730  LIPASE 21   No results for input(s): AMMONIA in the last 168 hours. Coagulation Profile:  Recent Labs Lab 03/06/16 0922  INR 1.26   Cardiac Enzymes: No results for input(s): CKTOTAL, CKMB, CKMBINDEX, TROPONINI in the last 168 hours. BNP (last 3 results) No results for input(s): PROBNP in the last 8760 hours. HbA1C: No results for input(s): HGBA1C in the last 72 hours. CBG:  Recent Labs Lab 03/06/16 2352 03/07/16 0730  GLUCAP 142* 133*   Lipid Profile: No results for input(s): CHOL, HDL, LDLCALC, TRIG, CHOLHDL, LDLDIRECT in the last 72 hours. Thyroid Function Tests: No results for input(s): TSH, T4TOTAL, FREET4, T3FREE, THYROIDAB in the last 72 hours. Anemia Panel: No results for input(s): VITAMINB12, FOLATE, FERRITIN, TIBC, IRON, RETICCTPCT in the last 72 hours. Sepsis Labs:  Recent Labs Lab 03/06/16 1757  LATICACIDVEN 1.60    Recent Results (from the past 240 hour(s))  Culture, Urine     Status: None   Collection Time: 02/27/16  3:16 AM  Result Value Ref Range Status   Specimen Description URINE, CLEAN CATCH  Final   Special Requests NONE  Final   Culture NO GROWTH  Final   Report Status 02/28/2016 FINAL  Final  Gram stain     Status: None   Collection Time: 02/27/16 10:27 AM  Result Value Ref Range Status   Specimen Description FLUID PERITONEAL  Final   Special Requests NONE  Final   Gram Stain   Final    RARE WBC PRESENT, PREDOMINANTLY MONONUCLEAR NO ORGANISMS SEEN    Report Status 02/27/2016 FINAL  Final  Culture, body fluid-bottle     Status: None   Collection Time: 02/27/16 10:27 AM  Result Value Ref Range Status   Specimen Description PERITONEAL  Final   Special Requests NONE  Final   Culture NO GROWTH 5 DAYS  Final   Report Status 03/03/2016  FINAL  Final       Radiology Studies: Ct Abdomen Pelvis W Contrast  Result Date: 03/06/2016 CLINICAL DATA:  Vomiting for 2 days.  Abdominal distention. EXAM: CT ABDOMEN AND PELVIS WITH CONTRAST TECHNIQUE: Multidetector CT imaging of the abdomen and pelvis was performed using the standard protocol following bolus administration of intravenous contrast. CONTRAST:  135mL ISOVUE-300 IOPAMIDOL (ISOVUE-300) INJECTION 61% COMPARISON:  03/01/2016, 02/26/2016 FINDINGS: Lower chest: Mild linear scarring or atelectasis in the bases. No lung base nodules or consolidation. Trace left pleural effusion. Hepatobiliary: Biliary stent is satisfactorily positioned. No hepatic parenchymal masses. Pneumobilia, related to the stent. Slight residual biliary prominence, but decreased from the pre-stent  study. Pancreas: Unremarkable. No pancreatic ductal dilatation or surrounding inflammatory changes. Spleen: Normal in size without focal abnormality. Adrenals/Urinary Tract: Adrenal glands are unremarkable. Kidneys are normal, without renal calculi, focal lesion, or hydronephrosis. Bladder is unremarkable. Stomach/Bowel: Marked dilatation of stomach and small bowel, new from 02/26/2016. Transition to decompressed distal ileum occurs in the right lower quadrant, axial images 46- 53 series 2. The bowel appears somewhat matted together in this area, without definition of a discrete mass. There is diffuse peritoneal enhancement and this is increased from 02/26/2016. There is moderate volume peritoneal ascites. There is diffuse stranding infiltrative opacity in the omentum. These findings most likely represent peritoneal carcinomatosis. The increased degree of peritoneal enhancement and bowel wall enhancement is fairly striking compared to 02/26/2016, suggesting rapid disease progression. Less likely possibility of infectious peritonitis not entirely excluded. Percutaneous peritoneal drainage catheter is visible anteriorly, appearing  satisfactorily positioned. Vascular/Lymphatic: Aortic atherosclerosis. No enlarged abdominal or pelvic lymph nodes. Reproductive: Unremarkable Other: No extraluminal air to suggest bowel perforation. Musculoskeletal: No significant skeletal lesions. IMPRESSION: 1. New marked dilatation of stomach and small bowel with transition to decompressed distal ileum in the right lower quadrant. Probable small bowel obstruction. A discrete mass is not visible at the transition point, but the small bowel appears somewhat matted together. 2. 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 This is worsened from 02/26/2016, suggesting rapid progression of disease. Less likely possibility of infectious peritonitis cannot be excluded. 3. Biliary stent and peritoneal drainage catheter appear satisfactorily positioned. Electronically Signed   By: Andreas Newport M.D.   On: 03/06/2016 19:01   Ir US Guide Vasc Access Right  Result Date: 03/06/2016 INDICATION: 71 year old with metastatic adenocarcinoma and malignant ascites. Patient needs a Port-A-Cath for chemotherapy and a tunneled peritoneal catheter for refractory symptomatic ascites. EXAM: FLUOROSCOPIC AND ULTRASOUND GUIDED PLACEMENT OF A SUBCUTANEOUS PORT IMAGE GUIDED PLACEMENT OF TUNNELED PERITONEAL CATHETER COMPARISON:  None. MEDICATIONS: Ancef 2 g; The antibiotic was administered within an appropriate time interval prior to skin puncture. ANESTHESIA/SEDATION: Versed 2.5 mg IV; Fentanyl 125 mcg IV; Moderate Sedation Time:  65 minutes The patient was continuously monitored during the procedure by the interventional radiology nurse under my direct supervision. FLUOROSCOPY TIME:  36 seconds, 3 mGy COMPLICATIONS: None immediate. PROCEDURE: The procedure, risks, benefits, and alternatives were explained to the patient. Questions regarding the procedure were encouraged and answered. The  patient understands and consents to the procedure. Patient was placed supine on the interventional table. Ultrasound confirmed a patent right internal jugular vein. The right chest and neck were cleaned with a skin antiseptic and a sterile drape was placed. Maximal barrier sterile technique was utilized including caps, mask, sterile gowns, sterile gloves, sterile drape, hand hygiene and skin antiseptic. The right neck was anesthetized with 1% lidocaine. Small incision was made in the right neck with a blade. Micropuncture set was placed in the right internal jugular vein with ultrasound guidance. The micropuncture wire was used for measurement purposes. The right chest was anesthetized with 1% lidocaine with epinephrine. #15 blade was used to make an incision and a subcutaneous port pocket was formed. [place] was assembled. Subcutaneous tunnel was formed with a stiff tunneling device. The port catheter was brought through the subcutaneous tunnel. The port was placed in the subcutaneous pocket. The micropuncture set was exchanged for a peel-away sheath. The catheter was placed through the peel-away sheath and the tip was positioned at the superior  cavoatrial junction. Catheter placement was confirmed with fluoroscopy. The port was accessed and flushed with heparinized saline. The port pocket was closed using two layers of absorbable sutures and Dermabond. The vein skin site was closed using a single layer of absorbable suture and Dermabond. Sterile dressings were applied. Patient tolerated the procedure well without an immediate complication. Ultrasound and fluoroscopic images were taken and saved for this procedure. Attention was directed to the peritoneal catheter placement. The right side of the abdomen was prepped and draped in sterile fashion. Ultrasound was used to confirm an adequate pocket in the right lateral abdomen. Skin was anesthetized with 1% lidocaine. Needle was directed into the  peritoneal space with ultrasound guidance and clear yellow fluid was aspirated. A wire was advanced into the peritoneal cavity. The skin anterior to the peritoneal entry site was anesthetized with 1% lidocaine. Small incision was made. A subcutaneous tract was created between the 2 incisions. The PleurX peritoneal catheter was placed through the tunnel and the cuff was placed underneath the skin. A peel-away sheath was placed over the peritoneal wire and the PleurX catheter was placed through the peel-away sheath. Catheter directed to the left side of the abdomen. Catheter was attached to suction canister and clear yellow fluid was easily draining. Peritoneal entry site was closed using absorbable suture and skin glue. Catheter was sutured to skin with Prolene suture. Dressing was placed over the incisions. Fluoroscopic and ultrasound images were taken and saved for documentation. IMPRESSION: Placement of a subcutaneous port device. Catheter tip in the superior cavoatrial junction. Successful placement of a tunneled peritoneal catheter with image guidance. Electronically Signed   By: Markus Daft M.D.   On: 03/06/2016 14:24   Ir Image Guided Drainage Percut Cath  Peritoneal Retroperit  Result Date: 03/06/2016 INDICATION: 71 year old with metastatic adenocarcinoma and malignant ascites. Patient needs a Port-A-Cath for chemotherapy and a tunneled peritoneal catheter for refractory symptomatic ascites. EXAM: FLUOROSCOPIC AND ULTRASOUND GUIDED PLACEMENT OF A SUBCUTANEOUS PORT IMAGE GUIDED PLACEMENT OF TUNNELED PERITONEAL CATHETER COMPARISON:  None. MEDICATIONS: Ancef 2 g; The antibiotic was administered within an appropriate time interval prior to skin puncture. ANESTHESIA/SEDATION: Versed 2.5 mg IV; Fentanyl 125 mcg IV; Moderate Sedation Time:  65 minutes The patient was continuously monitored during the procedure by the interventional radiology nurse under my direct supervision. FLUOROSCOPY TIME:  36 seconds, 3 mGy  COMPLICATIONS: None immediate. PROCEDURE: The procedure, risks, benefits, and alternatives were explained to the patient. Questions regarding the procedure were encouraged and answered. The patient understands and consents to the procedure. Patient was placed supine on the interventional table. Ultrasound confirmed a patent right internal jugular vein. The right chest and neck were cleaned with a skin antiseptic and a sterile drape was placed. Maximal barrier sterile technique was utilized including caps, mask, sterile gowns, sterile gloves, sterile drape, hand hygiene and skin antiseptic. The right neck was anesthetized with 1% lidocaine. Small incision was made in the right neck with a blade. Micropuncture set was placed in the right internal jugular vein with ultrasound guidance. The micropuncture wire was used for measurement purposes. The right chest was anesthetized with 1% lidocaine with epinephrine. #15 blade was used to make an incision and a subcutaneous port pocket was formed. [place] was assembled. Subcutaneous tunnel was formed with a stiff tunneling device. The port catheter was brought through the subcutaneous tunnel. The port was placed in the subcutaneous pocket. The micropuncture set was exchanged for a peel-away sheath. The catheter was placed  through the peel-away sheath and the tip was positioned at the superior cavoatrial junction. Catheter placement was confirmed with fluoroscopy. The port was accessed and flushed with heparinized saline. The port pocket was closed using two layers of absorbable sutures and Dermabond. The vein skin site was closed using a single layer of absorbable suture and Dermabond. Sterile dressings were applied. Patient tolerated the procedure well without an immediate complication. Ultrasound and fluoroscopic images were taken and saved for this procedure. Attention was directed to the peritoneal catheter placement. The right side of the abdomen was prepped  and draped in sterile fashion. Ultrasound was used to confirm an adequate pocket in the right lateral abdomen. Skin was anesthetized with 1% lidocaine. Needle was directed into the peritoneal space with ultrasound guidance and clear yellow fluid was aspirated. A wire was advanced into the peritoneal cavity. The skin anterior to the peritoneal entry site was anesthetized with 1% lidocaine. Small incision was made. A subcutaneous tract was created between the 2 incisions. The PleurX peritoneal catheter was placed through the tunnel and the cuff was placed underneath the skin. A peel-away sheath was placed over the peritoneal wire and the PleurX catheter was placed through the peel-away sheath. Catheter directed to the left side of the abdomen. Catheter was attached to suction canister and clear yellow fluid was easily draining. Peritoneal entry site was closed using absorbable suture and skin glue. Catheter was sutured to skin with Prolene suture. Dressing was placed over the incisions. Fluoroscopic and ultrasound images were taken and saved for documentation. IMPRESSION: Placement of a subcutaneous port device. Catheter tip in the superior cavoatrial junction. Successful placement of a tunneled peritoneal catheter with image guidance. Electronically Signed   By: Markus Daft M.D.   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 Patient needs a Port-A-Cath for chemotherapy and a tunneled peritoneal catheter for refractory symptomatic ascites. EXAM: FLUOROSCOPIC AND ULTRASOUND GUIDED PLACEMENT OF A SUBCUTANEOUS PORT IMAGE GUIDED PLACEMENT OF TUNNELED PERITONEAL CATHETER COMPARISON:  None. MEDICATIONS: Ancef 2 g; The antibiotic was administered within an appropriate time interval prior to skin puncture. ANESTHESIA/SEDATION: Versed 2.5 mg IV; Fentanyl 125 mcg IV; Moderate Sedation Time:  65 minutes The  patient was continuously monitored during the procedure by the interventional radiology nurse under my direct supervision. FLUOROSCOPY TIME:  36 seconds, 3 mGy COMPLICATIONS: None immediate. PROCEDURE: The procedure, risks, benefits, and alternatives were explained to the patient. Questions regarding the procedure were encouraged and answered. The patient understands and consents to the procedure. Patient was placed supine on the interventional table. Ultrasound confirmed a patent right internal jugular vein. The right chest and neck were cleaned with a skin antiseptic and a sterile drape was placed. Maximal barrier sterile technique was utilized including caps, mask, sterile gowns, sterile gloves, sterile drape, hand hygiene and skin antiseptic. The right neck was anesthetized with 1% lidocaine. Small incision was made in the right neck with a blade. Micropuncture set was placed in the right internal jugular vein with ultrasound guidance. The micropuncture wire was used for measurement purposes. The right chest was anesthetized with 1% lidocaine with epinephrine. #15 blade was used to make an incision and a subcutaneous port pocket was formed. [place] was assembled. Subcutaneous tunnel was formed with a stiff tunneling device. The port catheter was brought through the subcutaneous tunnel. The port was placed in the subcutaneous pocket. The micropuncture set was  exchanged for a peel-away sheath. The catheter was placed through the peel-away sheath and the tip was positioned at the superior cavoatrial junction. Catheter placement was confirmed with fluoroscopy. The port was accessed and flushed with heparinized saline. The port pocket was closed using two layers of absorbable sutures and Dermabond. The vein skin site was closed using a single layer of absorbable suture and Dermabond. Sterile dressings were applied. Patient tolerated the procedure well without an immediate complication. Ultrasound and  fluoroscopic images were taken and saved for this procedure. Attention was directed to the peritoneal catheter placement. The right side of the abdomen was prepped and draped in sterile fashion. Ultrasound was used to confirm an adequate pocket in the right lateral abdomen. Skin was anesthetized with 1% lidocaine. Needle was directed into the peritoneal space with ultrasound guidance and clear yellow fluid was aspirated. A wire was advanced into the peritoneal cavity. The skin anterior to the peritoneal entry site was anesthetized with 1% lidocaine. Small incision was made. A subcutaneous tract was created between the 2 incisions. The PleurX peritoneal catheter was placed through the tunnel and the cuff was placed underneath the skin. A peel-away sheath was placed over the peritoneal wire and the PleurX catheter was placed through the peel-away sheath. Catheter directed to the left side of the abdomen. Catheter was attached to suction canister and clear yellow fluid was easily draining. Peritoneal entry site was closed using absorbable suture and skin glue. Catheter was sutured to skin with Prolene suture. Dressing was placed over the incisions. Fluoroscopic and ultrasound images were taken and saved for documentation. IMPRESSION: Placement of a subcutaneous port device. Catheter tip in the superior cavoatrial junction. Successful placement of a tunneled peritoneal catheter with image guidance. Electronically Signed   By: Markus Daft M.D.   On: 03/06/2016 14:24      Scheduled Meds: . diatrizoate meglumine-sodium  90 mL Per NG tube Once  . lip balm  1 application Topical BID  . pantoprazole (PROTONIX) IV  40 mg Intravenous Daily  . polyvinyl alcohol  1 drop Both Eyes QHS   Continuous Infusions: . dextrose 5 % and 0.9% NaCl 75 mL/hr at 03/07/16 1117     LOS: 1 day    Time spent: 40 minutes   Dessa Phi, DO [facility] [email or web address] Password [email or web address] 03/07/2016, 2:06 PM

## 2016-03-07 NOTE — Procedures (Signed)
Successful NG tube placement with fluoro.  JWatts MD

## 2016-03-07 NOTE — Progress Notes (Signed)
S: No acute changes. NG unable to be placed.   Vitals, labs, intake/output, and orders reviewed at this time.  Gen: A&Ox3, no distress  H&N: EOMI, atraumatic, neck supple Chest: unlabored respirations, RRR Abd: soft, nontender, distended, clamped peritoneal drain in RUQ Ext: warm, no edema Neuro: grossly normal  Lines/tubes/drains: PIV, port, peritoneal drain  A/P:  HD 2 with likely malignant obstruction- carcinomatosis and malignant ascites due to cholangiocarcinoma. He's had no surgeries or major illnesses until this recent diagnosis. Pending conversation with oncology and palliative care, I think his best option would be palliative G tube in [place]. Fluoro-guided NG to start and try to decompress/ do small bowel protocol is worth a try as well. Surgical exploration would be very high risk for significant and quality-of-life limiting complications, as Dr. [name] indicated.  Will continue to follow.   Romana Juniper, MD [facility], [place] Pager [phone]

## 2016-03-07 NOTE — Progress Notes (Signed)
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 Patient still feels nauseated, NG tube placement was attempted, unsuccessful, waiting to be placed by IR.  Objective:  Vitals:   03/07/16 1718 03/07/16 2202  BP: (!) 151/94 (!) 148/86  Pulse: 92 86  Resp: 18 18  Temp: 97.6 F (36.4 C) 97.8 F (36.6 C)    Body mass index is 28.75 kg/m.  Intake/Output Summary (Last 24 hours) at 03/07/16 2236 Last data filed at 03/07/16 1900  Gross per 24 hour  Intake          1271.25 ml  Output                0 ml  Net          1271.25 ml     Sclerae unicteric  Oropharynx clear  No peripheral adenopathy  Lungs clear -- no rales or rhonchi  Heart regular rate and rhythm  Abdomen Distended, Pleurx is covered   MSK no focal spinal tenderness, no peripheral edema  Neuro nonfocal   CBG (last 3)   Recent Labs  03/06/16 2352 03/07/16 0730 03/07/16 1506  GLUCAP 142* 133* 147*     Labs:  Lab Results  Component Value Date   WBC 12.4 (H) 03/07/2016   HGB 13.5 03/07/2016   HCT 39.3 03/07/2016   MCV 98.5 03/07/2016   PLT 330 03/07/2016   NEUTROABS 10.3 (H) 03/06/2016   CMP Latest Ref Rng & Units 03/07/2016 03/06/2016 03/06/2016  Glucose 65 - 99 mg/dL 135(H) 144(H) 152(H)  BUN 6 - 20 mg/dL 38(H) 42(H) 38(H)  Creatinine 0.61 - 1.24 mg/dL 1.40(H) 1.36(H) 1.34(H)  Sodium 135 - 145 mmol/L 135 134(L) 132(L)  Potassium 3.5 - 5.1 mmol/L 4.6 4.6 4.9  Chloride 101 - 111 mmol/L 92(L) 91(L) 90(L)  CO2 22 - 32 mmol/L 31 32 28  Calcium 8.9 - 10.3 mg/dL 8.1(L) 8.5(L) 8.9  Total Protein 6.5 - 8.1 g/dL - 6.7 -  Total Bilirubin 0.3 - 1.2 mg/dL - 0.8 -  Alkaline Phos 38 - 126 U/L - 107 -  AST 15 - 41 U/L - 45(H) -  ALT 17 - 63 U/L - 36 -    Urine Studies No results for input(s): UHGB,  CRYS in the last 72 hours.  Invalid input(s): UACOL, UAPR, USPG, UPH, UTP, UGL, UKET, UBIL, UNIT, UROB, ULEU, UEPI, UWBC, [name], [place], [place]  Basic Metabolic Panel:  Recent Labs Lab 03/06/16 0922 03/06/16 1730 03/07/16 0537  NA 132* 134* 135  K 4.9 4.6 4.6  CL 90* 91* 92*  CO2 28 32 31  GLUCOSE 152* 144* 135*  BUN 38* 42* 38*  CREATININE 1.34* 1.36* 1.40*  CALCIUM 8.9 8.5* 8.1*   GFR Estimated Creatinine Clearance: 61.4 mL/min (by C-G formula based on SCr of 1.4 mg/dL (H)). Liver Function Tests:  Recent Labs Lab 03/06/16 1730  AST 45*  ALT 36  ALKPHOS 107  BILITOT 0.8  PROT 6.7  ALBUMIN 2.3*    Recent Labs Lab 03/06/16 1730  LIPASE 21   No results for input(s): AMMONIA in the last 168 hours. Coagulation profile  Recent Labs Lab 03/06/16 0922  INR 1.26    CBC:  Recent Labs Lab 03/06/16 0922 03/06/16 1730 03/07/16 0537  WBC 12.9* 12.3* 12.4*  NEUTROABS 10.3*  --   --  HGB 14.3 14.1 13.5  HCT 41.4 40.6 39.3  MCV 98.6 97.6 98.5  PLT 429* 422* 330   Cardiac Enzymes: No results for input(s): CKTOTAL, CKMB, CKMBINDEX, TROPONINI in the last 168 hours. BNP: Invalid input(s): POCBNP CBG:  Recent Labs Lab 03/06/16 2352 03/07/16 0730 03/07/16 1506  GLUCAP 142* 133* 147*   D-Dimer No results for input(s): DDIMER in the last 72 hours. Hgb A1c No results for input(s): HGBA1C in the last 72 hours. Lipid Profile No results for input(s): CHOL, HDL, LDLCALC, TRIG, CHOLHDL, LDLDIRECT in the last 72 hours. Thyroid function studies No results for input(s): TSH, T4TOTAL, T3FREE, THYROIDAB in the last 72 hours.  Invalid input(s): FREET3 Anemia work up No results for input(s): VITAMINB12, FOLATE, FERRITIN, TIBC, IRON, RETICCTPCT in the last 72 hours. Microbiology Recent Results (from the past 240 hour(s))  Culture, Urine     Status: None   Collection Time: 02/27/16  3:16 AM  Result Value Ref Range Status   Specimen Description URINE,  CLEAN CATCH  Final   Special Requests NONE  Final   Culture NO GROWTH  Final   Report Status 02/28/2016 FINAL  Final  Gram stain     Status: None   Collection Time: 02/27/16 10:27 AM  Result Value Ref Range Status   Specimen Description FLUID PERITONEAL  Final   Special Requests NONE  Final   Gram Stain   Final    RARE WBC PRESENT, PREDOMINANTLY MONONUCLEAR NO ORGANISMS SEEN    Report Status 02/27/2016 FINAL  Final  Culture, body fluid-bottle     Status: None   Collection Time: 02/27/16 10:27 AM  Result Value Ref Range Status   Specimen Description PERITONEAL  Final   Special Requests NONE  Final   Culture NO GROWTH 5 DAYS  Final   Report Status 03/03/2016 FINAL  Final      Studies:  Ct Abdomen Pelvis W Contrast  Result Date: 03/06/2016 CLINICAL DATA:  Vomiting for 2 days.  Abdominal distention. EXAM: CT ABDOMEN AND PELVIS WITH CONTRAST TECHNIQUE: Multidetector CT imaging of the abdomen and pelvis was performed using the standard protocol following bolus administration of intravenous contrast. CONTRAST:  16mL ISOVUE-300 IOPAMIDOL (ISOVUE-300) INJECTION 61% COMPARISON:  03/01/2016, 02/26/2016 FINDINGS: Lower chest: Mild linear scarring or atelectasis in the bases. No lung base nodules or consolidation. Trace left pleural effusion. Hepatobiliary: Biliary stent is satisfactorily positioned. No hepatic parenchymal masses. Pneumobilia, related to the stent. Slight residual biliary prominence, but decreased from the pre-stent study. Pancreas: Unremarkable. No pancreatic ductal dilatation or surrounding inflammatory changes. Spleen: Normal in size without focal abnormality. Adrenals/Urinary Tract: Adrenal glands are unremarkable. Kidneys are normal, without renal calculi, focal lesion, or hydronephrosis. Bladder is unremarkable. Stomach/Bowel: Marked dilatation of stomach and small bowel, new from 02/26/2016. Transition to decompressed distal ileum occurs in the right lower quadrant, axial  images 46- 53 series 2. The bowel appears somewhat matted together in this area, without definition of a discrete mass. There is diffuse peritoneal enhancement and this is increased from 02/26/2016. There is moderate volume peritoneal ascites. There is diffuse stranding infiltrative opacity in the omentum. These findings most likely represent peritoneal carcinomatosis. The increased degree of peritoneal enhancement and bowel wall enhancement is fairly striking compared to 02/26/2016, suggesting rapid disease progression. Less likely possibility of infectious peritonitis not entirely excluded. Percutaneous peritoneal drainage catheter is visible anteriorly, appearing satisfactorily positioned. Vascular/Lymphatic: Aortic atherosclerosis. No enlarged abdominal or pelvic lymph nodes. Reproductive: Unremarkable Other: No extraluminal air to suggest bowel  perforation. Musculoskeletal: No significant skeletal lesions. IMPRESSION: 1. New marked dilatation of stomach and small bowel with transition to decompressed distal ileum in the right lower quadrant. Probable small bowel obstruction. A discrete mass is not visible at the transition point, but the small bowel appears somewhat matted together. 2. 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 This is worsened from 02/26/2016, suggesting rapid progression of disease. Less likely possibility of infectious peritonitis cannot be excluded. 3. Biliary stent and peritoneal drainage catheter appear satisfactorily positioned. Electronically Signed   By: Andreas Newport M.D.   On: 03/06/2016 19:01   Ir US Guide Vasc Access Right  Result Date: 03/06/2016 INDICATION: 71 year old with metastatic adenocarcinoma and malignant ascites. Patient needs a Port-A-Cath for chemotherapy and a tunneled peritoneal catheter for refractory symptomatic ascites. EXAM: FLUOROSCOPIC AND ULTRASOUND GUIDED  PLACEMENT OF A SUBCUTANEOUS PORT IMAGE GUIDED PLACEMENT OF TUNNELED PERITONEAL CATHETER COMPARISON:  None. MEDICATIONS: Ancef 2 g; The antibiotic was administered within an appropriate time interval prior to skin puncture. ANESTHESIA/SEDATION: Versed 2.5 mg IV; Fentanyl 125 mcg IV; Moderate Sedation Time:  65 minutes The patient was continuously monitored during the procedure by the interventional radiology nurse under my direct supervision. FLUOROSCOPY TIME:  36 seconds, 3 mGy COMPLICATIONS: None immediate. PROCEDURE: The procedure, risks, benefits, and alternatives were explained to the patient. Questions regarding the procedure were encouraged and answered. The patient understands and consents to the procedure. Patient was placed supine on the interventional table. Ultrasound confirmed a patent right internal jugular vein. The right chest and neck were cleaned with a skin antiseptic and a sterile drape was placed. Maximal barrier sterile technique was utilized including caps, mask, sterile gowns, sterile gloves, sterile drape, hand hygiene and skin antiseptic. The right neck was anesthetized with 1% lidocaine. Small incision was made in the right neck with a blade. Micropuncture set was placed in the right internal jugular vein with ultrasound guidance. The micropuncture wire was used for measurement purposes. The right chest was anesthetized with 1% lidocaine with epinephrine. #15 blade was used to make an incision and a subcutaneous port pocket was formed. [place] was assembled. Subcutaneous tunnel was formed with a stiff tunneling device. The port catheter was brought through the subcutaneous tunnel. The port was placed in the subcutaneous pocket. The micropuncture set was exchanged for a peel-away sheath. The catheter was placed through the peel-away sheath and the tip was positioned at the superior cavoatrial junction. Catheter placement was confirmed with fluoroscopy. The port was accessed and  flushed with heparinized saline. The port pocket was closed using two layers of absorbable sutures and Dermabond. The vein skin site was closed using a single layer of absorbable suture and Dermabond. Sterile dressings were applied. Patient tolerated the procedure well without an immediate complication. Ultrasound and fluoroscopic images were taken and saved for this procedure. Attention was directed to the peritoneal catheter placement. The right side of the abdomen was prepped and draped in sterile fashion. Ultrasound was used to confirm an adequate pocket in the right lateral abdomen. Skin was anesthetized with 1% lidocaine. Needle was directed into the peritoneal space with ultrasound guidance and clear yellow fluid was aspirated. A wire was advanced into the peritoneal cavity. The skin anterior to the peritoneal entry site was anesthetized with 1% lidocaine. Small incision was made. A subcutaneous tract was created between the 2 incisions. The PleurX peritoneal catheter was placed through the tunnel and the cuff  was placed underneath the skin. A peel-away sheath was placed over the peritoneal wire and the PleurX catheter was placed through the peel-away sheath. Catheter directed to the left side of the abdomen. Catheter was attached to suction canister and clear yellow fluid was easily draining. Peritoneal entry site was closed using absorbable suture and skin glue. Catheter was sutured to skin with Prolene suture. Dressing was placed over the incisions. Fluoroscopic and ultrasound images were taken and saved for documentation. IMPRESSION: Placement of a subcutaneous port device. Catheter tip in the superior cavoatrial junction. Successful placement of a tunneled peritoneal catheter with image guidance. Electronically Signed   By: Markus Daft M.D.   On: 03/06/2016 14:24   Dg Addison Bailey G Tube Plc W/fl W/rad  Result Date: 03/07/2016 Gilford Silvius, MD     03/07/2016  5:26 PM Successful NG tube placement with fluoro.  JWatts MD  Ir Image Guided Drainage Percut Cath  Peritoneal Retroperit  Result Date: 03/06/2016 INDICATION: 71 year old with metastatic adenocarcinoma and malignant ascites. Patient needs a Port-A-Cath for chemotherapy and a tunneled peritoneal catheter for refractory symptomatic ascites. EXAM: FLUOROSCOPIC AND ULTRASOUND GUIDED PLACEMENT OF A SUBCUTANEOUS PORT IMAGE GUIDED PLACEMENT OF TUNNELED PERITONEAL CATHETER COMPARISON:  None. MEDICATIONS: Ancef 2 g; The antibiotic was administered within an appropriate time interval prior to skin puncture. ANESTHESIA/SEDATION: Versed 2.5 mg IV; Fentanyl 125 mcg IV; Moderate Sedation Time:  65 minutes The patient was continuously monitored during the procedure by the interventional radiology nurse under my direct supervision. FLUOROSCOPY TIME:  36 seconds, 3 mGy COMPLICATIONS: None immediate. PROCEDURE: The procedure, risks, benefits, and alternatives were explained to the patient. Questions regarding the procedure were encouraged and answered. The patient understands and consents to the procedure. Patient was placed supine on the interventional table. Ultrasound confirmed a patent right internal jugular vein. The right chest and neck were cleaned with a skin antiseptic and a sterile drape was placed. Maximal barrier sterile technique was utilized including caps, mask, sterile gowns, sterile gloves, sterile drape, hand hygiene and skin antiseptic. The right neck was anesthetized with 1% lidocaine. Small incision was made in the right neck with a blade. Micropuncture set was placed in the right internal jugular vein with ultrasound guidance. The micropuncture wire was used for measurement purposes. The right chest was anesthetized with 1% lidocaine with epinephrine. #15 blade was used to make an incision and a subcutaneous port pocket was formed. [place] was assembled. Subcutaneous tunnel was formed with a stiff tunneling device. The port catheter was brought  through the subcutaneous tunnel. The port was placed in the subcutaneous pocket. The micropuncture set was exchanged for a peel-away sheath. The catheter was placed through the peel-away sheath and the tip was positioned at the superior cavoatrial junction. Catheter placement was confirmed with fluoroscopy. The port was accessed and flushed with heparinized saline. The port pocket was closed using two layers of absorbable sutures and Dermabond. The vein skin site was closed using a single layer of absorbable suture and Dermabond. Sterile dressings were applied. Patient tolerated the procedure well without an immediate complication. Ultrasound and fluoroscopic images were taken and saved for this procedure. Attention was directed to the peritoneal catheter placement. The right side of the abdomen was prepped and draped in sterile fashion. Ultrasound was used to confirm an adequate pocket in the right lateral abdomen. Skin was anesthetized with 1% lidocaine. Needle was directed into the peritoneal space with ultrasound guidance and clear yellow fluid was aspirated. A wire  was advanced into the peritoneal cavity. The skin anterior to the peritoneal entry site was anesthetized with 1% lidocaine. Small incision was made. A subcutaneous tract was created between the 2 incisions. The PleurX peritoneal catheter was placed through the tunnel and the cuff was placed underneath the skin. A peel-away sheath was placed over the peritoneal wire and the PleurX catheter was placed through the peel-away sheath. Catheter directed to the left side of the abdomen. Catheter was attached to suction canister and clear yellow fluid was easily draining. Peritoneal entry site was closed using absorbable suture and skin glue. Catheter was sutured to skin with Prolene suture. Dressing was placed over the incisions. Fluoroscopic and ultrasound images were taken and saved for documentation. IMPRESSION: Placement of a subcutaneous port device.  Catheter tip in the superior cavoatrial junction. Successful placement of a tunneled peritoneal catheter with image guidance. Electronically Signed   By: Markus Daft M.D.   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 Patient needs a Port-A-Cath for chemotherapy and a tunneled peritoneal catheter for refractory symptomatic ascites. EXAM: FLUOROSCOPIC AND ULTRASOUND GUIDED PLACEMENT OF A SUBCUTANEOUS PORT IMAGE GUIDED PLACEMENT OF TUNNELED PERITONEAL CATHETER COMPARISON:  None. MEDICATIONS: Ancef 2 g; The antibiotic was administered within an appropriate time interval prior to skin puncture. ANESTHESIA/SEDATION: Versed 2.5 mg IV; Fentanyl 125 mcg IV; Moderate Sedation Time:  65 minutes The patient was continuously monitored during the procedure by the interventional radiology nurse under my direct supervision. FLUOROSCOPY TIME:  36 seconds, 3 mGy COMPLICATIONS: None immediate. PROCEDURE: The procedure, risks, benefits, and alternatives were explained to the patient. Questions regarding the procedure were encouraged and answered. The patient understands and consents to the procedure. Patient was placed supine on the interventional table. Ultrasound confirmed a patent right internal jugular vein. The right chest and neck were cleaned with a skin antiseptic and a sterile drape was placed. Maximal barrier sterile technique was utilized including caps, mask, sterile gowns, sterile gloves, sterile drape, hand hygiene and skin antiseptic. The right neck was anesthetized with 1% lidocaine. Small incision was made in the right neck with a blade. Micropuncture set was placed in the right internal jugular vein with ultrasound guidance. The micropuncture wire was used for measurement purposes. The right chest was anesthetized with 1% lidocaine with epinephrine. #15 blade was used to make an incision and a  subcutaneous port pocket was formed. [place] was assembled. Subcutaneous tunnel was formed with a stiff tunneling device. The port catheter was brought through the subcutaneous tunnel. The port was placed in the subcutaneous pocket. The micropuncture set was exchanged for a peel-away sheath. The catheter was placed through the peel-away sheath and the tip was positioned at the superior cavoatrial junction. Catheter placement was confirmed with fluoroscopy. The port was accessed and flushed with heparinized saline. The port pocket was closed using two layers of absorbable sutures and Dermabond. The vein skin site was closed using a single layer of absorbable suture and Dermabond. Sterile dressings were applied. Patient tolerated the procedure well without an immediate complication. Ultrasound and fluoroscopic images were taken and saved for this procedure. Attention was directed to the peritoneal catheter placement. The right side of the abdomen was prepped and draped in sterile fashion. Ultrasound was used to confirm an adequate pocket in the right lateral abdomen. Skin was anesthetized with 1% lidocaine. Needle was directed into the peritoneal space with ultrasound  guidance and clear yellow fluid was aspirated. A wire was advanced into the peritoneal cavity. The skin anterior to the peritoneal entry site was anesthetized with 1% lidocaine. Small incision was made. A subcutaneous tract was created between the 2 incisions. The PleurX peritoneal catheter was placed through the tunnel and the cuff was placed underneath the skin. A peel-away sheath was placed over the peritoneal wire and the PleurX catheter was placed through the peel-away sheath. Catheter directed to the left side of the abdomen. Catheter was attached to suction canister and clear yellow fluid was easily draining. Peritoneal entry site was closed using absorbable suture and skin glue. Catheter was sutured to skin with Prolene suture.  Dressing was placed over the incisions. Fluoroscopic and ultrasound images were taken and saved for documentation. IMPRESSION: Placement of a subcutaneous port device. Catheter tip in the superior cavoatrial junction. Successful placement of a tunneled peritoneal catheter with image guidance. Electronically Signed   By: Markus Daft M.D.   On: 03/06/2016 14:24    Assessment: 71 y.o. 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  1. Small bop section secondary to peritoneal carcinomatosis 2. 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 Obstructive jaundice, status post CBD stent placement, resolved 4. Anorexia and weight loss  5. Moderate calorie and protein malnutrition 6. AKI secondary to dehydration   Plan:  -will try to manage his SBO conservatively with nothing by mouth and NG tube decompression  -I agree with surgical team that surgery is very high risk due to his peritoneum carcinomatosis and malignant ascites -we have discussed that the goal of care is palliative, and he agrees. -will hold palliative chemo for now. If his SBO resolves, he is able to recover well, I will consider palliative chemo. He was previously very healthy, newly diagnosed cancer, not quite ready for comfort care only yet. -If his condition does not improve or deteriorated, certainly comfort care is reasonable. -I will follow up when he is in house.    Truitt Merle, MD 03/07/2016

## 2016-03-07 NOTE — Consult Note (Signed)
Consultation Note Date: 03/07/2016   Patient Name: Matthew Waller  DOB: 1945-09-26  MRN: [id number]  Age / Sex: 71 y.o., male  PCP: [name], MD Referring Physician: Shon Millet*  Reason for Consultation: Establishing goals of care and Non pain symptom management  HPI/Patient Profile: 71 y.o. 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 He subsequently had an ERCP with biliary stent placed d/t stricture in CBD, pleurx placed for management of his recurrent ascites, and PAC placed for planned palliative systemic chemotherapy. PET scan still pending. He then presented to ED with nausea and vomiting for 24 hours and was admitted on 03/06/2016 after CT abd showed likely small bowel obstruction. 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 Palliative consult for goals of care conversation.  Clinical Assessment and Goals of Care: I met with Matthew Waller and his wife at his bedside. I explained my role on his care team, emphasizing I would be helping with symptom management, ensuring information was well communicated with them, and walking them through options and hard decisions if they should occur.   In our conversation we talked about his recent diagnosis. He and his wife had a clear understanding that his cancer could not be cured, and that the goal of interventions (such as chemo) was to give him more quality time. I did gently address that the goal is two fold: time and quality of life, however those two things are not always tied. I explained that thinking about these as separate goals (not inherently linked) is important, as there may be options presented that would provide one at the cost of the other. He and his wife were receptive to this but clearly overwhelmed and  did not want to engage the topic further. Consequently, I did not address code status during my meeting with them as it would have been too much to process in one conversation.   In terms of this admission, we discussed the interplay between Oncology, Surgery, and the Hospitalist team. I walked him through the two options being discussed: G tube vs NG tube, and explained that the teams were talking to determine which would be the best for him at this juncture. He had met with Oncology prior to me, and relayed that he had been told the plan was to continue to treat with systemic chemo once the bowel obstruction resolved. I reinforced that the timeline for resolution was unknown, but we would be monitoring his tube output, symptoms, and imaging to determine change. At present, his main concerns are ongoing symptoms: reflux, nausea, abdominal pain.  Primary Decision Maker PATIENT   SUMMARY OF RECOMMENDATIONS    Full code with full scope interventions; pt intends to pursue cancer treatment once obstruction resolves  NG tube vs G-tube per primary team  Symptoms  Abdominal pain: Robinul 0.19m q4h PRN (antimuscarinic to aid in reduction of colicky pain from smooth muscle spasm and bowel wall distension); Dilaudid 0.567mIV q4H PRN (for severe pain, avoid morphine given rising creatinine and risk for toxic metabolite build-up)  GI secretions/inflammation: Decadron and Famotidine (can use Octreotide if this is ineffective)  Nausea: PRN IV Zofran and PRN IV Compazine  Would avoid Reglan in setting of complete obstruction as it can cause significant cramping with no benefit  Sleep: PRN IV benadryl  Code Status/Advance Care Planning:  Full code  Palliative Prophylaxis:   Frequent Pain Assessment and Oral Care  Additional Recommendations (Limitations, Scope, Preferences):  Full Scope Treatment  Psycho-social/Spiritual:   Desire for further Chaplaincy support:no  Additional Recommendations:  TBD  Prognosis:   Unable to determine  Discharge Planning: To Be Determined      Primary Diagnoses: Present on Admission: . Primary metastastic cholangiocarcinoma of bile duct . Malignant ascites . 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 SBO (small bowel obstruction)   I have reviewed the medical record, interviewed the patient and family, and examined the patient. The following aspects are pertinent.  Past Medical History:  Diagnosis Date  . Abdominal distension 02/2016   Social History   Social History  . Marital status: Married    Spouse name: N/A  . Number of children: N/A  . Years of education: N/A   Social History Main Topics  . Smoking status: Former Smoker    Quit date: 1965  . Smokeless tobacco: Never Used  . Alcohol use Yes     Comment: occas  . Drug use: No  . Sexual activity: Not Asked   Other Topics Concern  . None   Social History Narrative  . None   Family History  Problem Relation Age of Onset  . Hypertension Other    Scheduled Meds: . diatrizoate meglumine-sodium  90 mL Per NG tube Once  . lip balm  1 application Topical BID  . polyvinyl alcohol  1 drop Both Eyes QHS   Continuous Infusions: . dextrose 5 % and 0.9% NaCl     PRN Meds:.acetaminophen **OR** acetaminophen, diphenhydrAMINE, magic mouthwash, menthol-cetylpyridinium, methocarbamol (ROBAXIN)  IV, metoCLOPramide (REGLAN) injection, ondansetron (ZOFRAN) IV **OR** ondansetron (ZOFRAN) IV, ondansetron **OR** ondansetron (ZOFRAN) IV, phenol, prochlorperazine No Known Allergies   Review of Systems  Constitutional: Positive for activity change, appetite change, fatigue and unexpected weight change.  HENT: Positive for sore throat and voice change.   Eyes: Negative for visual disturbance.  Respiratory: Negative for chest tightness and shortness of breath.   Gastrointestinal: Positive for abdominal distention, abdominal pain, constipation, nausea and vomiting.  Skin: Positive  for pallor.  Neurological: Positive for weakness. Negative for dizziness, speech difficulty and headaches.  Psychiatric/Behavioral: Positive for sleep disturbance. Negative for confusion. The patient is nervous/anxious.     Physical Exam  Constitutional: He is oriented to person, place, and time. He appears well-developed and well-nourished. No distress.  HENT:  Head: Normocephalic and atraumatic.  Mouth/Throat: Mucous membranes are dry. No oropharyngeal exudate.  Eyes: EOM are normal.  Neck: Normal range of motion.  Abdominal: He exhibits distension and ascites. Bowel sounds are decreased. There is generalized tenderness.  Musculoskeletal: Normal range of motion.  Neurological: He is alert and oriented to person, place, and time.  Skin: Skin is warm and dry. There is pallor.  Psychiatric: He has a normal mood and affect. His behavior is normal. Judgment and thought content normal.    Vital Signs: BP (!) 156/87 (BP Location: Left Arm)   Pulse 91   Temp 98.2 F (36.8 C) (Oral)   Resp 19   Ht _0  (1.905 m)   Wt 104.3 kg (230 lb)   SpO2 94%   BMI 28.75 kg/m  Pain Assessment: No/denies pain   Pain Score: 0-No pain   SpO2: SpO2: 94 % O2 Device:SpO2: 94 % O2 Flow Rate: .   IO: Intake/output summary:  Intake/Output Summary (Last 24 hours) at 03/07/16 0836 Last data filed at 03/06/16 2057  Gross per 24 hour  Intake             1000 ml  Output                0 ml  Net             1000 ml    LBM:   Baseline Weight: Weight: 104.3 kg (230 lb) Most recent weight: Weight: 104.3 kg (230 lb)       Time In: 1400 Time Out: 1540 Time Total: 110 minutes Greater than 50%  of this time was spent counseling and coordinating care related to the above assessment and plan.  Signed by: Charlynn Court, NP Palliative Medicine Team Pager # [phone] (M-F 7a-5p) Team Phone # [phone] (Nights/Weekends)

## 2016-03-08 ENCOUNTER — Inpatient Hospital Stay (HOSPITAL_COMMUNITY): Payer: PPO

## 2016-03-08 LAB — GLUCOSE, CAPILLARY
GLUCOSE-CAPILLARY: 107 mg/dL — AB (ref 65–99)
GLUCOSE-CAPILLARY: 135 mg/dL — AB (ref 65–99)
Glucose-Capillary: 127 mg/dL — ABNORMAL HIGH (ref 65–99)

## 2016-03-08 LAB — BASIC METABOLIC PANEL
Anion gap: 10 (ref 5–15)
BUN: 45 mg/dL — ABNORMAL HIGH (ref 6–20)
CHLORIDE: 99 mmol/L — AB (ref 101–111)
CO2: 29 mmol/L (ref 22–32)
CREATININE: 1.39 mg/dL — AB (ref 0.61–1.24)
Calcium: 8 mg/dL — ABNORMAL LOW (ref 8.9–10.3)
GFR calc Af Amer: 56 mL/min — ABNORMAL LOW (ref >60)
GFR calc non Af Amer: 49 mL/min — ABNORMAL LOW (ref >60)
Glucose, Bld: 136 mg/dL — ABNORMAL HIGH (ref 65–99)
Potassium: 4.5 mmol/L (ref 3.5–5.1)
SODIUM: 138 mmol/L (ref 135–145)

## 2016-03-08 LAB — CBC WITH DIFFERENTIAL/PLATELET
Basophils Absolute: 0 10*3/uL (ref 0.0–0.1)
Basophils Relative: 0 %
EOS ABS: 0 10*3/uL (ref 0.0–0.7)
Eosinophils Relative: 0 %
HEMATOCRIT: 40.6 % (ref 39.0–52.0)
HEMOGLOBIN: 13.5 g/dL (ref 13.0–17.0)
Lymphocytes Relative: 11 %
Lymphs Abs: 1.3 10*3/uL (ref 0.7–4.0)
MCH: 33.3 pg (ref 26.0–34.0)
MCHC: 33.3 g/dL (ref 30.0–36.0)
MCV: 100.2 fL — AB (ref 78.0–100.0)
MONOS PCT: 6 %
Monocytes Absolute: 0.7 10*3/uL (ref 0.1–1.0)
NEUTROS PCT: 83 %
Neutro Abs: 9.9 10*3/uL — ABNORMAL HIGH (ref 1.7–7.7)
Platelets: 256 10*3/uL (ref 150–400)
RBC: 4.05 MIL/uL — ABNORMAL LOW (ref 4.22–5.81)
RDW: 13.7 % (ref 11.5–15.5)
WBC: 11.9 10*3/uL — ABNORMAL HIGH (ref 4.0–10.5)

## 2016-03-08 MED ORDER — BUTAMBEN-TETRACAINE-BENZOCAINE 2-2-14 % EX AERO
INHALATION_SPRAY | CUTANEOUS | Status: AC
  Filled 2016-03-08: qty 20

## 2016-03-08 MED ORDER — SODIUM CHLORIDE 0.9 % IV SOLN
INTRAVENOUS | Status: DC
Start: 2016-03-08 — End: 2016-03-10
  Administered 2016-03-08: 15:00:00 via INTRAVENOUS

## 2016-03-08 MED ORDER — IOPAMIDOL (ISOVUE-300) INJECTION 61%
INTRAVENOUS | Status: AC
  Filled 2016-03-08: qty 50

## 2016-03-08 NOTE — Progress Notes (Addendum)
S: No acute changes. NG placed in IR yesterday but unfortunately patient pulled it out overnight unintentionally.    Vitals, labs, intake/output, and orders reviewed at this time. NG output 100cc...patient's wife states that a full canister was emptied before she left last night.  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 He's had no surgeries or major illnesses until this recent diagnosis.  Have ordered replacement of NG in fluoro but discussed with Dr. Maylene Roes, apparently he is now considering a palliative decompressive g tube. I would recommend that be placed in [place]; will speak with him again today,  Ultimately his prognosis given the stage IV cholangiocarcinoma is very poor.  Will continue to follow.   Romana Juniper, MD [facility], [place] Pager [phone]   Addendum: Rene Paci gone back and spoken with the patient and his wife. I've reiterated to them his poor prognosis, that all therapies at this point including the planned chemo are palliative, my concern that this is a malignant obstruction and that it will not resolve, that surgery would be very likely to have a poor outcome with significant hindrance to quality of life, and that the g tube would be a palliative measure. He is agreeable to g tube placement. I am not convinced that he and his wife fully grasp the overall prognosis.

## 2016-03-08 NOTE — Progress Notes (Signed)
PROGRESS NOTE    Matthew Waller  [id number] DOB: Jan 23, 1946 DOA: 03/06/2016 PCP: [name], MD     Brief Narrative:  Matthew Waller is a 71 y.o. 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 Patient presented to the ER because of persistent nausea vomiting over the last 24 hours. Patient also has been having abdominal discomfort. Patient's last bowel movement was almost a week ago. CT scan of the abdomen shows features concerning for small bowel obstruction with transition point in the distal ileum with possible peritoneal carcinomatosis. On-call general surgeon Dr. [name] was consulted. Patient admitted for small bowel obstruction with known history of recent diagnosis of cholangiocarcinoma.   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 Patient now considering decompressive G-tube. Discussed with patient, wife, Dr. Kae Heller today. Patient to speak with [facility] again today to make decision regarding repeat NG vs G tube.  -Access PleurX for peritoneal fluid removal today as patient's abdomen has become quite distended, moderate amt ascites seen on CT  abd/pelvis.   Severe malnutrition in context of acute illness/injury  -Appreciate RD  -NPO for now due to SBO    DVT prophylaxis: SCDs Code Status: Full Family Communication: spoke with wife over the phone during examination  Disposition Plan: pending further improvement, stabilization   Consultants:   Oncology  General Surgery  Palliative care   Procedures:   None  Antimicrobials:   None     Subjective: Patient states he feels a little bit better today. He was able to get more sleep last night which has helped. He states that his abdomen continues to be very distended but feels like there is activity in his bowels and feels like he might be able to have a BM later today. No nausea or vomiting.    Objective: Vitals:   03/07/16 0402 03/07/16 1718 03/07/16 2202 03/08/16 0546  BP: (!) 156/87 (!) 151/94 (!) 148/86 (!) 149/91  Pulse: 91 92 86 84  Resp: 19 18 18 18   Temp: 98.2 F (36.8 C) 97.6 F (36.4 C) 97.8 F (36.6 C) 97.2 F (36.2 C)  TempSrc: Oral Oral Oral Oral  SpO2: 94% 93% 95% 93%  Weight:      Height:        Intake/Output Summary (Last 24 hours) at 03/08/16 1125 Last data filed at 03/08/16 0400  Gross per 24 hour  Intake           1677.5 ml  Output              425 ml  Net           1252.5 ml   Filed Weights   03/06/16 2206  Weight: 104.3 kg (230 lb)    Examination:  General exam: Appears calm, comfortable  Respiratory system: Clear to auscultation. Respiratory effort normal. Cardiovascular system: S1 & S2 heard, RRR. No JVD, murmurs, rubs, gallops or clicks. No pedal edema. Gastrointestinal system: Abdomen is distended, mildly TTP, +PleurX in place   Central nervous system: Alert and oriented. No focal neurological deficits. Extremities: Symmetric 5 x 5 power. Skin: No rashes, lesions or ulcers Psychiatry: Judgement and insight appear normal. Mood & affect appropriate.   Data Reviewed: I have personally reviewed following labs and  imaging studies  CBC:  Recent Labs Lab 03/06/16 0922 03/06/16 1730 03/07/16 0537 03/08/16 0522  WBC 12.9* 12.3* 12.4* 11.9*  NEUTROABS 10.3*  --   --  9.9*  HGB 14.3 14.1 13.5 13.5  HCT 41.4 40.6 39.3 40.6  MCV 98.6 97.6 98.5 100.2*  PLT 429* 422* 330 [id number]   Basic Metabolic Panel:  Recent Labs Lab 03/06/16 0922 03/06/16 1730 03/07/16 0537 03/08/16 0522  NA 132* 134* 135 138  K 4.9 4.6 4.6 4.5  CL 90* 91* 92* 99*  CO2 28 32 31 29  GLUCOSE 152* 144* 135* 136*  BUN 38* 42* 38* 45*  CREATININE 1.34* 1.36* 1.40* 1.39*  CALCIUM 8.9 8.5* 8.1* 8.0*   GFR: Estimated Creatinine Clearance: 61.9 mL/min (by C-G formula based on SCr of 1.39 mg/dL (H)). Liver Function Tests:  Recent Labs Lab 03/06/16 1730  AST 45*  ALT 36  ALKPHOS 107  BILITOT 0.8  PROT 6.7  ALBUMIN 2.3*    Recent Labs Lab 03/06/16 1730  LIPASE 21   No results for input(s): AMMONIA in the last 168 hours. Coagulation Profile:  Recent Labs Lab 03/06/16 0922  INR 1.26   Cardiac Enzymes: No results for input(s): CKTOTAL, CKMB, CKMBINDEX, TROPONINI in the last 168 hours. BNP (last 3 results) No results for input(s): PROBNP in the last 8760 hours. HbA1C: No results for input(s): HGBA1C in the last 72 hours. CBG:  Recent Labs Lab 03/06/16 2352 03/07/16 0730 03/07/16 1506 03/08/16 0011 03/08/16 0749  GLUCAP 142* 133* 147* 135* 127*   Lipid Profile: No results for input(s): CHOL, HDL, LDLCALC, TRIG, CHOLHDL, LDLDIRECT in the last 72 hours. Thyroid Function Tests: No results for input(s): TSH, T4TOTAL, FREET4, T3FREE, THYROIDAB in the last 72 hours. Anemia Panel: No results for input(s): VITAMINB12, FOLATE, FERRITIN, TIBC, IRON, RETICCTPCT in the last 72 hours. Sepsis Labs:  Recent Labs Lab 03/06/16 1757  LATICACIDVEN 1.60    No results found for this or any previous visit (from the past 240 hour(s)).     Radiology Studies: Ct Abdomen Pelvis W Contrast  Result Date:  03/06/2016 CLINICAL DATA:  Vomiting for 2 days.  Abdominal distention. EXAM: CT ABDOMEN AND PELVIS WITH CONTRAST TECHNIQUE: Multidetector CT imaging of the abdomen and pelvis was performed using the standard protocol following bolus administration of intravenous contrast. CONTRAST:  110mL ISOVUE-300 IOPAMIDOL (ISOVUE-300) INJECTION 61% COMPARISON:  03/01/2016, 02/26/2016 FINDINGS: Lower chest: Mild linear scarring or atelectasis in the bases. No lung base nodules or consolidation. Trace left pleural effusion. Hepatobiliary: Biliary stent is satisfactorily positioned. No hepatic parenchymal masses. Pneumobilia, related to the stent. Slight residual biliary prominence, but decreased from the pre-stent study. Pancreas: Unremarkable. No pancreatic ductal dilatation or surrounding inflammatory changes. Spleen: Normal in size without focal abnormality. Adrenals/Urinary Tract: Adrenal glands are unremarkable. Kidneys are normal, without renal calculi, focal lesion, or hydronephrosis. Bladder is unremarkable. Stomach/Bowel: Marked dilatation of stomach and small bowel, new from 02/26/2016. Transition to decompressed distal ileum occurs in the right lower quadrant, axial images 46- 53  series 2. The bowel appears somewhat matted together in this area, without definition of a discrete mass. There is diffuse peritoneal enhancement and this is increased from 02/26/2016. There is moderate volume peritoneal ascites. There is diffuse stranding infiltrative opacity in the omentum. These findings most likely represent peritoneal carcinomatosis. The increased degree of peritoneal enhancement and bowel wall enhancement is fairly striking compared to 02/26/2016, suggesting rapid disease progression. Less likely possibility of infectious peritonitis not entirely excluded. Percutaneous peritoneal drainage catheter is visible anteriorly, appearing satisfactorily positioned. Vascular/Lymphatic: Aortic atherosclerosis. No enlarged abdominal  or pelvic lymph nodes. Reproductive: Unremarkable Other: No extraluminal air to suggest bowel perforation. Musculoskeletal: No significant skeletal lesions. IMPRESSION: 1. New marked dilatation of stomach and small bowel with transition to decompressed distal ileum in the right lower quadrant. Probable small bowel obstruction. A discrete mass is not visible at the transition point, but the small bowel appears somewhat matted together. 2. 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 This is worsened from 02/26/2016, suggesting rapid progression of disease. Less likely possibility of infectious peritonitis cannot be excluded. 3. Biliary stent and peritoneal drainage catheter appear satisfactorily positioned. Electronically Signed   By: Andreas Newport M.D.   On: 03/06/2016 19:01   Ir US Guide Vasc Access Right  Result Date: 03/06/2016 INDICATION: 71 year old with metastatic adenocarcinoma and malignant ascites. Patient needs a Port-A-Cath for chemotherapy and a tunneled peritoneal catheter for refractory symptomatic ascites. EXAM: FLUOROSCOPIC AND ULTRASOUND GUIDED PLACEMENT OF A SUBCUTANEOUS PORT IMAGE GUIDED PLACEMENT OF TUNNELED PERITONEAL CATHETER COMPARISON:  None. MEDICATIONS: Ancef 2 g; The antibiotic was administered within an appropriate time interval prior to skin puncture. ANESTHESIA/SEDATION: Versed 2.5 mg IV; Fentanyl 125 mcg IV; Moderate Sedation Time:  65 minutes The patient was continuously monitored during the procedure by the interventional radiology nurse under my direct supervision. FLUOROSCOPY TIME:  36 seconds, 3 mGy COMPLICATIONS: None immediate. PROCEDURE: The procedure, risks, benefits, and alternatives were explained to the patient. Questions regarding the procedure were encouraged and answered. The patient understands and consents to the procedure. Patient was placed supine on the interventional  table. Ultrasound confirmed a patent right internal jugular vein. The right chest and neck were cleaned with a skin antiseptic and a sterile drape was placed. Maximal barrier sterile technique was utilized including caps, mask, sterile gowns, sterile gloves, sterile drape, hand hygiene and skin antiseptic. The right neck was anesthetized with 1% lidocaine. Small incision was made in the right neck with a blade. Micropuncture set was placed in the right internal jugular vein with ultrasound guidance. The micropuncture wire was used for measurement purposes. The right chest was anesthetized with 1% lidocaine with epinephrine. #15 blade was used to make an incision and a subcutaneous port pocket was formed. [place] was assembled. Subcutaneous tunnel was formed with a stiff tunneling device. The port catheter was brought through the subcutaneous tunnel. The port was placed in the subcutaneous pocket. The micropuncture set was exchanged for a peel-away sheath. The catheter was placed through the peel-away sheath and the tip was positioned at the superior cavoatrial junction. Catheter placement was confirmed with fluoroscopy. The port was accessed and flushed with heparinized saline. The port pocket was closed using two layers of absorbable sutures and Dermabond. The vein skin site was closed using a single layer of absorbable suture and Dermabond. Sterile dressings were applied. Patient tolerated the procedure well without an immediate complication. Ultrasound and fluoroscopic images were  taken and saved for this procedure. Attention was directed to the peritoneal catheter placement. The right side of the abdomen was prepped and draped in sterile fashion. Ultrasound was used to confirm an adequate pocket in the right lateral abdomen. Skin was anesthetized with 1% lidocaine. Needle was directed into the peritoneal space with ultrasound guidance and clear yellow fluid was aspirated. A wire was advanced into the  peritoneal cavity. The skin anterior to the peritoneal entry site was anesthetized with 1% lidocaine. Small incision was made. A subcutaneous tract was created between the 2 incisions. The PleurX peritoneal catheter was placed through the tunnel and the cuff was placed underneath the skin. A peel-away sheath was placed over the peritoneal wire and the PleurX catheter was placed through the peel-away sheath. Catheter directed to the left side of the abdomen. Catheter was attached to suction canister and clear yellow fluid was easily draining. Peritoneal entry site was closed using absorbable suture and skin glue. Catheter was sutured to skin with Prolene suture. Dressing was placed over the incisions. Fluoroscopic and ultrasound images were taken and saved for documentation. IMPRESSION: Placement of a subcutaneous port device. Catheter tip in the superior cavoatrial junction. Successful placement of a tunneled peritoneal catheter with image guidance. Electronically Signed   By: Markus Daft M.D.   On: 03/06/2016 14:24   Dg Abd Portable 1v-small Bowel Obstruction Protocol-initial, 8 Hr Delay  Result Date: 03/08/2016 CLINICAL DATA:  Small bowel obstruction, 8 hour follow-up. EXAM: PORTABLE ABDOMEN - 1 VIEW COMPARISON:  CT abdomen and pelvis March 06, 2016 FINDINGS: Contrast within proximal small bowel which is dilated to 5 cm. Gas distended small bowel RIGHT lower quadrant measuring up to 4.5 cm. Surgical drain upper abdomen. Biliary stent. No intra-abdominal mass effect or pathologic calcifications. Soft tissue planes and included osseous structures are nonsuspicious. IMPRESSION: Small bowel obstruction, limited contrast transit to proximal small bowel dilated at 5 cm. Electronically Signed   By: Elon Alas M.D.   On: 03/08/2016 05:30   Dg [place] Dys Tube Plc W/fl W/rad  Result Date: 03/07/2016 Gilford Silvius, MD     03/07/2016  5:26 PM Successful NG tube placement with fluoro. JWatts MD  Ir Image Guided  Drainage Percut Cath  Peritoneal Retroperit  Result Date: 03/06/2016 INDICATION: 71 year old with metastatic adenocarcinoma and malignant ascites. Patient needs a Port-A-Cath for chemotherapy and a tunneled peritoneal catheter for refractory symptomatic ascites. EXAM: FLUOROSCOPIC AND ULTRASOUND GUIDED PLACEMENT OF A SUBCUTANEOUS PORT IMAGE GUIDED PLACEMENT OF TUNNELED PERITONEAL CATHETER COMPARISON:  None. MEDICATIONS: Ancef 2 g; The antibiotic was administered within an appropriate time interval prior to skin puncture. ANESTHESIA/SEDATION: Versed 2.5 mg IV; Fentanyl 125 mcg IV; Moderate Sedation Time:  65 minutes The patient was continuously monitored during the procedure by the interventional radiology nurse under my direct supervision. FLUOROSCOPY TIME:  36 seconds, 3 mGy COMPLICATIONS: None immediate. PROCEDURE: The procedure, risks, benefits, and alternatives were explained to the patient. Questions regarding the procedure were encouraged and answered. The patient understands and consents to the procedure. Patient was placed supine on the interventional table. Ultrasound confirmed a patent right internal jugular vein. The right chest and neck were cleaned with a skin antiseptic and a sterile drape was placed. Maximal barrier sterile technique was utilized including caps, mask, sterile gowns, sterile gloves, sterile drape, hand hygiene and skin antiseptic. The right neck was anesthetized with 1% lidocaine. Small incision was made in the right neck with a blade. Micropuncture set was placed in the right  internal jugular vein with ultrasound guidance. The micropuncture wire was used for measurement purposes. The right chest was anesthetized with 1% lidocaine with epinephrine. #15 blade was used to make an incision and a subcutaneous port pocket was formed. [place] was assembled. Subcutaneous tunnel was formed with a stiff tunneling device. The port catheter was brought through the subcutaneous  tunnel. The port was placed in the subcutaneous pocket. The micropuncture set was exchanged for a peel-away sheath. The catheter was placed through the peel-away sheath and the tip was positioned at the superior cavoatrial junction. Catheter placement was confirmed with fluoroscopy. The port was accessed and flushed with heparinized saline. The port pocket was closed using two layers of absorbable sutures and Dermabond. The vein skin site was closed using a single layer of absorbable suture and Dermabond. Sterile dressings were applied. Patient tolerated the procedure well without an immediate complication. Ultrasound and fluoroscopic images were taken and saved for this procedure. Attention was directed to the peritoneal catheter placement. The right side of the abdomen was prepped and draped in sterile fashion. Ultrasound was used to confirm an adequate pocket in the right lateral abdomen. Skin was anesthetized with 1% lidocaine. Needle was directed into the peritoneal space with ultrasound guidance and clear yellow fluid was aspirated. A wire was advanced into the peritoneal cavity. The skin anterior to the peritoneal entry site was anesthetized with 1% lidocaine. Small incision was made. A subcutaneous tract was created between the 2 incisions. The PleurX peritoneal catheter was placed through the tunnel and the cuff was placed underneath the skin. A peel-away sheath was placed over the peritoneal wire and the PleurX catheter was placed through the peel-away sheath. Catheter directed to the left side of the abdomen. Catheter was attached to suction canister and clear yellow fluid was easily draining. Peritoneal entry site was closed using absorbable suture and skin glue. Catheter was sutured to skin with Prolene suture. Dressing was placed over the incisions. Fluoroscopic and ultrasound images were taken and saved for documentation. IMPRESSION: Placement of a subcutaneous port device. Catheter tip in the  superior cavoatrial junction. Successful placement of a tunneled peritoneal catheter with image guidance. Electronically Signed   By: Markus Daft M.D.   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 Patient needs a Port-A-Cath for chemotherapy and a tunneled peritoneal catheter for refractory symptomatic ascites. EXAM: FLUOROSCOPIC AND ULTRASOUND GUIDED PLACEMENT OF A SUBCUTANEOUS PORT IMAGE GUIDED PLACEMENT OF TUNNELED PERITONEAL CATHETER COMPARISON:  None. MEDICATIONS: Ancef 2 g; The antibiotic was administered within an appropriate time interval prior to skin puncture. ANESTHESIA/SEDATION: Versed 2.5 mg IV; Fentanyl 125 mcg IV; Moderate Sedation Time:  65 minutes The patient was continuously monitored during the procedure by the interventional radiology nurse under my direct supervision. FLUOROSCOPY TIME:  36 seconds, 3 mGy COMPLICATIONS: None immediate. PROCEDURE: The procedure, risks, benefits, and alternatives were explained to the patient. Questions regarding the procedure were encouraged and answered. The patient understands and consents to the procedure. Patient was placed supine on the interventional table. Ultrasound confirmed a patent right internal jugular vein. The right chest and neck were cleaned with a skin antiseptic and a sterile drape was placed. Maximal barrier sterile technique was utilized including caps, mask, sterile gowns, sterile gloves, sterile drape, hand hygiene and skin antiseptic. The right neck was anesthetized with 1% lidocaine. Small incision was made in the right neck with  a blade. Micropuncture set was placed in the right internal jugular vein with ultrasound guidance. The micropuncture wire was used for measurement purposes. The right chest was anesthetized with 1% lidocaine with epinephrine. #15 blade was used to make an incision and a subcutaneous port pocket  was formed. [place] was assembled. Subcutaneous tunnel was formed with a stiff tunneling device. The port catheter was brought through the subcutaneous tunnel. The port was placed in the subcutaneous pocket. The micropuncture set was exchanged for a peel-away sheath. The catheter was placed through the peel-away sheath and the tip was positioned at the superior cavoatrial junction. Catheter placement was confirmed with fluoroscopy. The port was accessed and flushed with heparinized saline. The port pocket was closed using two layers of absorbable sutures and Dermabond. The vein skin site was closed using a single layer of absorbable suture and Dermabond. Sterile dressings were applied. Patient tolerated the procedure well without an immediate complication. Ultrasound and fluoroscopic images were taken and saved for this procedure. Attention was directed to the peritoneal catheter placement. The right side of the abdomen was prepped and draped in sterile fashion. Ultrasound was used to confirm an adequate pocket in the right lateral abdomen. Skin was anesthetized with 1% lidocaine. Needle was directed into the peritoneal space with ultrasound guidance and clear yellow fluid was aspirated. A wire was advanced into the peritoneal cavity. The skin anterior to the peritoneal entry site was anesthetized with 1% lidocaine. Small incision was made. A subcutaneous tract was created between the 2 incisions. The PleurX peritoneal catheter was placed through the tunnel and the cuff was placed underneath the skin. A peel-away sheath was placed over the peritoneal wire and the PleurX catheter was placed through the peel-away sheath. Catheter directed to the left side of the abdomen. Catheter was attached to suction canister and clear yellow fluid was easily draining. Peritoneal entry site was closed using absorbable suture and skin glue. Catheter was sutured to skin with Prolene suture. Dressing was placed over the  incisions. Fluoroscopic and ultrasound images were taken and saved for documentation. IMPRESSION: Placement of a subcutaneous port device. Catheter tip in the superior cavoatrial junction. Successful placement of a tunneled peritoneal catheter with image guidance. Electronically Signed   By: Markus Daft M.D.   On: 03/06/2016 14:24      Scheduled Meds: . dexamethasone  4 mg Intravenous Q12H   And  . famotidine (PEPCID) IV  20 mg Intravenous Q12H  . lip balm  1 application Topical BID  . polyvinyl alcohol  1 drop Both Eyes QHS   Continuous Infusions:    LOS: 2 days    Time spent: 40 minutes   Dessa Phi, DO [facility] [email or web address] Password [email or web address] 03/08/2016, 11:25 AM

## 2016-03-08 NOTE — Progress Notes (Signed)
Daily Progress Note   Patient Name: Matthew Waller       Date: 03/08/2016 DOB: 07/11/1945  Age: 71 y.o. MRN#: [id number] Attending Physician: Shon Millet* Primary Care Physician: [name], MD Admit Date: 03/06/2016  Reason for Consultation/Follow-up: Non pain symptom management  Subjective: Matthew Waller is feeling better today. He slept well overnight. His nausea is much better today, as is his belly tightness (s/p PleurX drainage this afternoon). He also reports a decent bowel movement today. His main concern is his hoarse voice.  Length of Stay: 2  Current Medications: Scheduled Meds:  . dexamethasone  4 mg Intravenous Q12H   And  . famotidine (PEPCID) IV  20 mg Intravenous Q12H  . lip balm  1 application Topical BID  . polyvinyl alcohol  1 drop Both Eyes QHS    Continuous Infusions: . sodium chloride 50 mL/hr at 03/08/16 1459    PRN Meds: diphenhydrAMINE, glycopyrrolate, HYDROmorphone (DILAUDID) injection, LORazepam, magic mouthwash, menthol-cetylpyridinium, methocarbamol (ROBAXIN)  IV, [DISCONTINUED] ondansetron **OR** ondansetron (ZOFRAN) IV, phenol, prochlorperazine  Physical Exam         Constitutional: He is oriented to person, place, and time. He appears well-developed and well-nourished. No distress.  HENT:  Head: Normocephalic and atraumatic.  Mouth/Throat: Mucous membranes are dry. No oropharyngeal exudate.  Eyes: EOM are normal.  Neck: Normal range of motion.  Abdominal: He exhibits distension and ascites. Bowel sounds are decreased. There is generalized tenderness.  Musculoskeletal: Normal range of motion.  Neurological: He is alert and oriented to person, place, and time.  Skin: Skin is warm and dry. There is pallor.  Psychiatric: He has a normal mood and affect. His behavior is normal. Judgment  and thought content normal.   Vital Signs: BP (!) 149/91 (BP Location: Right Arm)   Pulse 84   Temp 97.2 F (36.2 C) (Oral)   Resp 18   Ht 6' 3" (1.905 m)   Wt 104.3 kg (230 lb)   SpO2 93%   BMI 28.75 kg/m  SpO2: SpO2: 93 % O2 Device: O2 Device: Not Delivered O2 Flow Rate:    Intake/output summary:  Intake/Output Summary (Last 24 hours) at 03/08/16 1520 Last data filed at 03/08/16 1400  Gross per 24 hour  Intake           1677.5 ml  Output             1425 ml  Net            252.5 ml   LBM: Last BM Date: 02/26/16; pt reported BM today! Baseline Weight: Weight: 104.3 kg (230 lb) Most recent weight: Weight: 104.3 kg (230 lb)  Flowsheet Rows   Flowsheet Row Most Recent Value  Intake Tab  Referral Department  Surgery  Unit at Time of Referral  Med/Surg Unit  Palliative Care Primary Diagnosis  Cancer  Date Notified  03/06/16  Palliative Care Type  New Palliative care  Reason for referral  Clarify Goals of Care  Date of Admission  03/06/16  Date first seen by Palliative Care  03/07/16  # of days Palliative referral response time  1 Day(s)  # of days IP prior to Palliative referral  0  Clinical Assessment  Psychosocial & Spiritual Assessment  Palliative Care Outcomes      Patient Active Problem List   Diagnosis Date Noted  . Protein-calorie malnutrition, severe 03/07/2016  . Palliative care by specialist   . 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 Small bowel obstruction in setting of carcinomatosis 03/06/2016  . SBO (small bowel obstruction) 03/06/2016  . Goals of care, counseling/discussion 03/06/2016  . Deviated nasal septum 03/06/2016  . Malignant ascites 03/01/2016  . 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 Abdominal pain 02/26/2016  . Transaminitis 02/26/2016  . Dysuria 02/26/2016  . Blepharitis of both eyes 02/12/2012  . Droopy eyelid 02/12/2012  . Bilateral dry eyes  02/12/2012    Palliative Care Assessment & Plan   HPI: 71 y.o. 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 He subsequently had an ERCP with biliary stent placed d/t stricture in CBD, pleurx placed for management of his recurrent ascites, and PAC placed for planned palliative systemic chemotherapy. PET scan still pending. He then presented to ED with nausea and vomiting for 24 hours and was admitted on 03/06/2016 after CT abd showed likely small bowel obstruction. 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 Palliative consult for goals of care conversation.  Assessment: I met with Matthew Waller and his wife on 2/1 for an initial consult. Please see the consult note for full discussion.   Today, I followed-up on symptom management. Overall, he is feeling much better. He did accidentally remove his NG tube overnight, but it has since been replaced today. His nausea is now at a minimum, reflux has subsided, and his belly feels less tight. After a great night of sleep, he also feels his energy has improved. In talking with him and his wife today they were very hopeful for ongoing improvement, and focused on progressive next steps. His wife expressed concern for when he gets home. Prior to admission he had struggled to get out of bed from a lying position, and his wife was concerned that she couldn't help him. She is also concerned he may need assistive devices to aid him in mobility.   Recommendations/Plan:  Full code with full scope interventions  PT consulted for evaluation   Plan for mittens overnight; pt wants to reduce risk of pulling out NG tube again  Symptoms ? Abdominal pain: Robinul 0.46m q4h PRN (antimuscarinic to aid in reduction of colicky pain from smooth muscle spasm and bowel wall distension); Dilaudid 0.552m IV q4H PRN (for severe pain, avoid morphine given rising creatinine and risk for toxic metabolite build-up) ? GI secretions/inflammation: Decadron and Famotidine (can use Octreotide if this is ineffective) ? Nausea: PRN IV Zofran and PRN IV Compazine  Would avoid Reglan in setting of complete obstruction as it can cause significant cramping with no benefit ? Sleep: PRN IV benadryl and PRN IV ativan  Goals of Care and Additional Recommendations:  Limitations on Scope of Treatment: Full Scope Treatment  Code Status:  Full code  Prognosis:   Unable to determine  Discharge Planning:  To Be Determined  Care plan was discussed with pt, wife, and care nurse.   Thank you for allowing the Palliative Medicine Team to assist in the care of this patient.  Total time: 35 minutes  Greater than 50%  of this time was spent counseling and coordinating care related to the above assessment and plan.  Charlynn Court, NP Palliative Medicine Team [phone] pager (7a-5p) Team Phone # [phone]

## 2016-03-08 NOTE — Progress Notes (Signed)
Patient ID: Matthew Waller, male   DOB: March 22, 1945, 71 y.o.   MRN: [id number] Asked to see patient about possible palliative g-tube.  He is well-known to the IR service for several procedures over the last week.  This has confirmed that he has metastatic adenocarcinoma, likely cholangiocarcinoma.  He is being followed by Dr. Burr Medico.  After his PAC and pleurX were placed on Wednesday, he was noted to have a malignant SBO and was admitted to [facility].  He had an NGT placed by fluoro, but the patient accidentally pulled it out overnight while having a dream.  He initially didn't want one replaced and the idea of a g-tube was offered to the patient.  During my conversation with the patient about a g-tube, Dr. Burr Medico came in as well.  The overall thoughts and feelings were that this obstruction could still potentially resolve and felt as if moving to a g-tube after only 24hrs of NGT decompression, may be a little too soon.  I believe Dr. Burr Medico was going to try and get in touch with general surgery.  For now, the patient would like to hold off on g-tube placement and attempt NGT placement again for decompression to try and get this obstruction better.  If that happens then he can hopefully get nutrition and try for chemotherapy.  We will follow in the chart so that if he does not improve or something changes and a g-tube is wanted sooner, then we can be available for that procedure.  [name] E 12:43 PM 03/08/2016

## 2016-03-08 NOTE — Progress Notes (Signed)
On entering the patient's room, the NGT was noted to be lying on the bed beside the patient.  On questioning the patient, he stated that he was "dreaming he was unhooking something" and that, when he stopped, "it was lying beside him."  He denies any increased abd distention or discomfort. Will not attempt to re-insert as IR had to insert this one after 3 attempts prior.

## 2016-03-08 NOTE — Progress Notes (Signed)
Matthew Waller   DOB:1945/10/15   [id number]   [id number]  Oncology follow-up note   Subjective: Pt had NG tube placed by radiology yesterday, unfortunately was incidentally pulled out last night during his sleep. He still feels quite bloated. After discussion with Dr. Maylene Roes and Dr. Kae Heller he decided to have a G-tube placement. Patient was discussing the G-tube placement with radiology PA Claiborne Billings when I went to see him.   Objective:  Vitals:   03/08/16 0546 03/08/16 1528  BP: (!) 149/91 (!) 143/93  Pulse: 84 85  Resp: 18 18  Temp: 97.2 F (36.2 C) 97.4 F (36.3 C)    Body mass index is 28.75 kg/m.  Intake/Output Summary (Last 24 hours) at 03/08/16 1810 Last data filed at 03/08/16 1400  Gross per 24 hour  Intake          1206.25 ml  Output             1427 ml  Net          -220.75 ml     Sclerae unicteric  Oropharynx clear  No peripheral adenopathy  Lungs clear -- no rales or rhonchi  Heart regular rate and rhythm  Abdomen Distended, Pleurx is covered   MSK no focal spinal tenderness, no peripheral edema  Neuro nonfocal   CBG (last 3)   Recent Labs  03/07/16 1506 03/08/16 0011 03/08/16 0749  GLUCAP 147* 135* 127*     Labs:  Lab Results  Component Value Date   WBC 11.9 (H) 03/08/2016   HGB 13.5 03/08/2016   HCT 40.6 03/08/2016   MCV 100.2 (H) 03/08/2016   PLT 256 03/08/2016   NEUTROABS 9.9 (H) 03/08/2016   CMP Latest Ref Rng & Units 03/08/2016 03/07/2016 03/06/2016  Glucose 65 - 99 mg/dL 136(H) 135(H) 144(H)  BUN 6 - 20 mg/dL 45(H) 38(H) 42(H)  Creatinine 0.61 - 1.24 mg/dL 1.39(H) 1.40(H) 1.36(H)  Sodium 135 - 145 mmol/L 138 135 134(L)  Potassium 3.5 - 5.1 mmol/L 4.5 4.6 4.6  Chloride 101 - 111 mmol/L 99(L) 92(L) 91(L)  CO2 22 - 32 mmol/L 29 31 32  Calcium 8.9 - 10.3 mg/dL 8.0(L) 8.1(L) 8.5(L)  Total Protein 6.5 - 8.1 g/dL - - 6.7  Total Bilirubin 0.3 - 1.2 mg/dL - - 0.8  Alkaline Phos 38 - 126 U/L - - 107  AST 15 - 41 U/L - - 45(H)  ALT 17 - 63 U/L  - - 36    Urine Studies No results for input(s): UHGB, CRYS in the last 72 hours.  Invalid input(s): UACOL, UAPR, USPG, UPH, UTP, UGL, UKET, UBIL, UNIT, UROB, [place], UEPI, UWBC, Duwayne Heck [place], [place]  Basic Metabolic Panel:  Recent Labs Lab 03/06/16 0922 03/06/16 1730 03/07/16 0537 03/08/16 0522  NA 132* 134* 135 138  K 4.9 4.6 4.6 4.5  CL 90* 91* 92* 99*  CO2 28 32 31 29  GLUCOSE 152* 144* 135* 136*  BUN 38* 42* 38* 45*  CREATININE 1.34* 1.36* 1.40* 1.39*  CALCIUM 8.9 8.5* 8.1* 8.0*   GFR Estimated Creatinine Clearance: 61.9 mL/min (by C-G formula based on SCr of 1.39 mg/dL (H)). Liver Function Tests:  Recent Labs Lab 03/06/16 1730  AST 45*  ALT 36  ALKPHOS 107  BILITOT 0.8  PROT 6.7  ALBUMIN 2.3*    Recent Labs Lab 03/06/16 1730  LIPASE 21   No results for input(s): AMMONIA in the last 168 hours. Coagulation profile  Recent Labs Lab 03/06/16 [phone]  INR 1.26    CBC:  Recent Labs Lab 03/06/16 0922 03/06/16 1730 03/07/16 0537 03/08/16 0522  WBC 12.9* 12.3* 12.4* 11.9*  NEUTROABS 10.3*  --   --  9.9*  HGB 14.3 14.1 13.5 13.5  HCT 41.4 40.6 39.3 40.6  MCV 98.6 97.6 98.5 100.2*  PLT 429* 422* 330 256   Cardiac Enzymes: No results for input(s): CKTOTAL, CKMB, CKMBINDEX, TROPONINI in the last 168 hours. BNP: Invalid input(s): POCBNP CBG:  Recent Labs Lab 03/06/16 2352 03/07/16 0730 03/07/16 1506 03/08/16 0011 03/08/16 0749  GLUCAP 142* 133* 147* 135* 127*   D-Dimer No results for input(s): DDIMER in the last 72 hours. Hgb A1c No results for input(s): HGBA1C in the last 72 hours. Lipid Profile No results for input(s): CHOL, HDL, LDLCALC, TRIG, CHOLHDL, LDLDIRECT in the last 72 hours. Thyroid function studies No results for input(s): TSH, T4TOTAL, T3FREE, THYROIDAB in the last 72 hours.  Invalid input(s): FREET3 Anemia work up No results for input(s): VITAMINB12, FOLATE, FERRITIN, TIBC, IRON, RETICCTPCT in the last 72  hours. Microbiology No results found for this or any previous visit (from the past 240 hour(s)).    Studies:  Ct Abdomen Pelvis W Contrast  Result Date: 03/06/2016 CLINICAL DATA:  Vomiting for 2 days.  Abdominal distention. EXAM: CT ABDOMEN AND PELVIS WITH CONTRAST TECHNIQUE: Multidetector CT imaging of the abdomen and pelvis was performed using the standard protocol following bolus administration of intravenous contrast. CONTRAST:  137mL ISOVUE-300 IOPAMIDOL (ISOVUE-300) INJECTION 61% COMPARISON:  03/01/2016, 02/26/2016 FINDINGS: Lower chest: Mild linear scarring or atelectasis in the bases. No lung base nodules or consolidation. Trace left pleural effusion. Hepatobiliary: Biliary stent is satisfactorily positioned. No hepatic parenchymal masses. Pneumobilia, related to the stent. Slight residual biliary prominence, but decreased from the pre-stent study. Pancreas: Unremarkable. No pancreatic ductal dilatation or surrounding inflammatory changes. Spleen: Normal in size without focal abnormality. Adrenals/Urinary Tract: Adrenal glands are unremarkable. Kidneys are normal, without renal calculi, focal lesion, or hydronephrosis. Bladder is unremarkable. Stomach/Bowel: Marked dilatation of stomach and small bowel, new from 02/26/2016. Transition to decompressed distal ileum occurs in the right lower quadrant, axial images 46- 53 series 2. The bowel appears somewhat matted together in this area, without definition of a discrete mass. There is diffuse peritoneal enhancement and this is increased from 02/26/2016. There is moderate volume peritoneal ascites. There is diffuse stranding infiltrative opacity in the omentum. These findings most likely represent peritoneal carcinomatosis. The increased degree of peritoneal enhancement and bowel wall enhancement is fairly striking compared to 02/26/2016, suggesting rapid disease progression. Less likely possibility of infectious peritonitis not entirely excluded.  Percutaneous peritoneal drainage catheter is visible anteriorly, appearing satisfactorily positioned. Vascular/Lymphatic: Aortic atherosclerosis. No enlarged abdominal or pelvic lymph nodes. Reproductive: Unremarkable Other: No extraluminal air to suggest bowel perforation. Musculoskeletal: No significant skeletal lesions. IMPRESSION: 1. New marked dilatation of stomach and small bowel with transition to decompressed distal ileum in the right lower quadrant. Probable small bowel obstruction. A discrete mass is not visible at the transition point, but the small bowel appears somewhat matted together. 2. 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 This is worsened from 02/26/2016, suggesting rapid progression of disease. Less likely possibility of infectious peritonitis cannot be excluded. 3. Biliary stent and peritoneal drainage catheter appear satisfactorily positioned. Electronically Signed   By: Andreas Newport M.D.   On: 03/06/2016 19:01   Dg Abd Portable 1v-small Bowel Obstruction Protocol-initial, 8 Hr Delay  Result Date: 03/08/2016 CLINICAL DATA:  Small bowel obstruction, 8 hour follow-up. EXAM: PORTABLE ABDOMEN - 1 VIEW COMPARISON:  CT abdomen and pelvis March 06, 2016 FINDINGS: Contrast within proximal small bowel which is dilated to 5 cm. Gas distended small bowel RIGHT lower quadrant measuring up to 4.5 cm. Surgical drain upper abdomen. Biliary stent. No intra-abdominal mass effect or pathologic calcifications. Soft tissue planes and included osseous structures are nonsuspicious. IMPRESSION: Small bowel obstruction, limited contrast transit to proximal small bowel dilated at 5 cm. Electronically Signed   By: Elon Alas M.D.   On: 03/08/2016 05:30   Dg Loyce Dys Tube Plc W/fl W/rad  Result Date: 03/08/2016 CLINICAL DATA:  NG tube placement EXAM: NASO G TUBE PLACEMENT WITH FL AND WITH RAD FLUOROSCOPY  TIME:  Fluoroscopy Time:  2 minutes 2 seconds Radiation Exposure Index (if provided by the fluoroscopic device): 8.9 mGy Number of Acquired Spot Images: 0 COMPARISON:  None. FINDINGS: NG tube was placed under fluoroscopic guidance, with the tip and side-port in the mid stomach. IMPRESSION: NG tube placed into the stomach. Electronically Signed   By: Rolm Baptise M.D.   On: 03/08/2016 15:23   Dg Addison Bailey G Tube Plc W/fl W/rad  Result Date: 03/07/2016 CLINICAL DATA:  Bowel obstruction with nausea. Therapeutic decompression. EXAM: NASO G TUBE PLACEMENT WITH FL AND WITH RAD FLUOROSCOPY TIME:  Fluoroscopy Time:  2 minutes 18 seconds Radiation Exposure Index (if provided by the fluoroscopic device): 19 mGy Number of Acquired Spot Images: 0 COMPARISON:  None. FINDINGS: The nose was anesthetized with viscous lidocaine. A nasogastric tube was inserted through the right nostril and easily advanced to the GE junction. At this point, there was resistance to tube passage. Attempt was made to pass a soft tip wire through the tube, but the wire tip preferentially track through side-port. Ultimately, the patient was sat up, and the tube was easily advanced and positioned in the stomach. Copious succus was aspirated. No indication of complication. IMPRESSION: Successful nasogastric tube placement. Electronically Signed   By: Monte Fantasia M.D.   On: 03/07/2016 17:27    Assessment: 71 y.o. 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  1. Small bowel obstruction secondary to peritoneal carcinomatosis 2. 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 Obstructive jaundice, status post CBD stent placement, resolved 4. Anorexia and weight loss  5. Moderate calorie and protein malnutrition 6. AKI secondary to dehydration   Plan:  -We again discussed the pro and cons of  G-tube, vs NG tube. Due to his significant ascites, the risk for infection, and he only had NG tube for less than half day, I encourage his to try NG tube placement again today by radiology, to see if his small bowel obstruction will resolve after decompression by NG tube. If not, G-tube may be a more long-term solution. Patient agreed. I spoke with Dr. Kae Heller also, she concurred.  -I also discussed the option of surgery for his SBO with Dr. Kae Heller if it does not resolve by conservative management. We both agree this is a very high risk surgery, but unfortunately if the small bowel does not resolve, he will not be a candidate for chemotherapy, his nutrition will be significant issue, and hospice will like be the only option if surgery is not offered.  -we also discussed the nutrition option of TPN, the benefit and risk of infection, etc. If surgery is  an option, and we probably should be more aggressive about his nutrition improvement and consider TPN. Will see how he does in the next few days. -I will followup on Monday.    Truitt Merle, MD 03/08/2016

## 2016-03-09 LAB — CBC WITH DIFFERENTIAL/PLATELET
BASOS ABS: 0 10*3/uL (ref 0.0–0.1)
BASOS PCT: 0 %
EOS ABS: 0 10*3/uL (ref 0.0–0.7)
Eosinophils Relative: 0 %
HEMATOCRIT: 40.4 % (ref 39.0–52.0)
HEMOGLOBIN: 13.4 g/dL (ref 13.0–17.0)
Lymphocytes Relative: 9 %
Lymphs Abs: 1 10*3/uL (ref 0.7–4.0)
MCH: 33.8 pg (ref 26.0–34.0)
MCHC: 33.2 g/dL (ref 30.0–36.0)
MCV: 102 fL — ABNORMAL HIGH (ref 78.0–100.0)
MONOS PCT: 10 %
Monocytes Absolute: 1.1 10*3/uL — ABNORMAL HIGH (ref 0.1–1.0)
NEUTROS ABS: 9.6 10*3/uL — AB (ref 1.7–7.7)
NEUTROS PCT: 81 %
Platelets: 225 10*3/uL (ref 150–400)
RBC: 3.96 MIL/uL — AB (ref 4.22–5.81)
RDW: 13.9 % (ref 11.5–15.5)
WBC: 11.8 10*3/uL — AB (ref 4.0–10.5)

## 2016-03-09 LAB — BASIC METABOLIC PANEL
ANION GAP: 8 (ref 5–15)
BUN: 45 mg/dL — ABNORMAL HIGH (ref 6–20)
CHLORIDE: 107 mmol/L (ref 101–111)
CO2: 29 mmol/L (ref 22–32)
Calcium: 8 mg/dL — ABNORMAL LOW (ref 8.9–10.3)
Creatinine, Ser: 1.49 mg/dL — ABNORMAL HIGH (ref 0.61–1.24)
GFR calc non Af Amer: 45 mL/min — ABNORMAL LOW (ref >60)
GFR, EST AFRICAN AMERICAN: 52 mL/min — AB (ref >60)
Glucose, Bld: 120 mg/dL — ABNORMAL HIGH (ref 65–99)
POTASSIUM: 4.8 mmol/L (ref 3.5–5.1)
SODIUM: 144 mmol/L (ref 135–145)

## 2016-03-09 LAB — GLUCOSE, CAPILLARY
GLUCOSE-CAPILLARY: 111 mg/dL — AB (ref 65–99)
GLUCOSE-CAPILLARY: 119 mg/dL — AB (ref 65–99)
Glucose-Capillary: 108 mg/dL — ABNORMAL HIGH (ref 65–99)

## 2016-03-09 MED ORDER — ENOXAPARIN SODIUM 40 MG/0.4ML ~~LOC~~ SOLN
40.0000 mg | SUBCUTANEOUS | Status: DC
  Administered 2016-03-09 – 2016-03-11 (×3): 40 mg via SUBCUTANEOUS
  Filled 2016-03-09 (×3): qty 0.4

## 2016-03-09 MED ORDER — DEXAMETHASONE SODIUM PHOSPHATE 4 MG/ML IJ SOLN
2.0000 mg | Freq: Two times a day (BID) | INTRAMUSCULAR | Status: DC
  Administered 2016-03-09 – 2016-03-10 (×3): 2 mg via INTRAVENOUS
  Filled 2016-03-09 (×4): qty 0.5

## 2016-03-09 MED ORDER — FAMOTIDINE IN NACL 20-0.9 MG/50ML-% IV SOLN
20.0000 mg | Freq: Two times a day (BID) | INTRAVENOUS | Status: DC
  Administered 2016-03-09 – 2016-03-10 (×3): 20 mg via INTRAVENOUS
  Filled 2016-03-09 (×4): qty 50

## 2016-03-09 NOTE — Evaluation (Signed)
Physical Therapy Evaluation Patient Details Name: Matthew Waller MRN: [id number] DOB: April 23, 1945 Today's Date: 03/09/2016   History of Present Illness  71 yo male admitted with SBO in setting of carcinomatosis. Hx of met cholangiocarcinoma, pleurx cathether placement 1/31.   Clinical Impression  On eval, pt was Min guard assist for mobility. He walked ~800 feet around unit. Unsteady gait pattern noted (drifting, stumbling). Primary concern was helping pt to be able to get in and out of bed with the least amount of assistance possible. Educated pt on logroll technique. He was able to perform task with supervision level assist. Will follow to continue to practice bed mobility, strengthening, and higher level balance training. Encouraged pt to continue ambulating with family as well.     Follow Up Recommendations Home health PT (if pt is agreeable)    Equipment Recommendations  None recommended by PT    Recommendations for Other Services       Precautions / Restrictions Precautions Precautions: Fall Restrictions Weight Bearing Restrictions: No      Mobility  Bed Mobility Overal bed mobility: Needs Assistance Bed Mobility: Rolling;Sidelying to Sit Rolling: Supervision Sidelying to sit: Supervision       General bed mobility comments: HOB flat. Cues for logroll technique. No physical assist given.   Transfers Overall transfer level: Needs assistance   Transfers: Sit to/from Stand Sit to Stand: Supervision         General transfer comment: for safety.   Ambulation/Gait Ambulation/Gait assistance: Min guard Ambulation Distance (Feet): 800 Feet Assistive device: None Gait Pattern/deviations: Step-through pattern;Staggering left;Staggering right;Drifts right/left     General Gait Details: Intermttent unsteadiness requiring close guard assist, especially without UE support. Pt tolerated distance well.   Stairs            Wheelchair Mobility    Modified Rankin  (Stroke Patients Only)       Balance Overall balance assessment: Needs assistance         Standing balance support: No upper extremity supported;During functional activity Standing balance-Leahy Scale: Good Standing balance comment: Unsteadiness observed by therapist however pt stated he did not feel particularly unsteady. Close guard required.                              Pertinent Vitals/Pain Pain Assessment: Faces Faces Pain Scale: Hurts little more Pain Location: R side with activity or when lying on R side Pain Intervention(s): Monitored during session;Repositioned    Home Living Family/patient expects to be discharged to:: Private residence Living Arrangements: Spouse/significant other Available Help at Discharge: Family Type of Home: House       Home Layout: One level Home Equipment: None      Prior Function Level of Independence: Independent               Hand Dominance        Extremity/Trunk Assessment   Upper Extremity Assessment Upper Extremity Assessment: Overall WFL for tasks assessed    Lower Extremity Assessment Lower Extremity Assessment: Generalized weakness    Cervical / Trunk Assessment Cervical / Trunk Assessment: Normal  Communication   Communication: No difficulties  Cognition Arousal/Alertness: Awake/alert Behavior During Therapy: WFL for tasks assessed/performed Overall Cognitive Status: Within Functional Limits for tasks assessed                      General Comments      Exercises     Assessment/Plan  PT Assessment Patient needs continued PT services  PT Problem List Decreased mobility;Decreased balance;Pain          PT Treatment Interventions Gait training;Therapeutic activities;Therapeutic exercise;Patient/family education;Functional mobility training;Balance training    PT Goals (Current goals can be found in the Care Plan section)  Acute Rehab PT Goals Patient Stated Goal: to regain  strength. maintain independence PT Goal Formulation: With patient/family Time For Goal Achievement: 03/23/16 Potential to Achieve Goals: Good    Frequency Min 2X/week   Barriers to discharge        Co-evaluation               End of Session   Activity Tolerance: Patient tolerated treatment well Patient left: in chair;with call bell/phone within reach;with family/visitor present           Time: 1364-3837 PT Time Calculation (min) (ACUTE ONLY): 14 min   Charges:   PT Evaluation $PT Eval Low Complexity: 1 Procedure     PT G Codes:        Weston Anna, MPT Pager: [phone]

## 2016-03-09 NOTE — Progress Notes (Signed)
Daily Progress Note   Patient Name: Matthew Waller       Date: 03/09/2016 DOB: 1945/04/04  Age: 71 y.o. MRN#: [id number] Attending Physician: Shon Millet* Primary Care Physician: [name], MD Admit Date: 03/06/2016  Reason for Consultation/Follow-up: Non pain symptom management  Subjective: Matthew Waller is doing well today. He did accidentally remove the NG tube again overnight, despite the use of mitts to try to avoid that. However, early this morning he had a substancial bowel movement with improved symptoms and strength. His voice has also now normalized. No acute complaints at present.  Length of Stay: 3  Current Medications: Scheduled Meds:  . dexamethasone  4 mg Intravenous Q12H   And  . famotidine (PEPCID) IV  20 mg Intravenous Q12H  . enoxaparin (LOVENOX) injection  40 mg Subcutaneous Q24H  . lip balm  1 application Topical BID    Continuous Infusions: . sodium chloride 50 mL/hr at 03/08/16 1459    PRN Meds: diphenhydrAMINE, glycopyrrolate, HYDROmorphone (DILAUDID) injection, LORazepam, magic mouthwash, menthol-cetylpyridinium, methocarbamol (ROBAXIN)  IV, [DISCONTINUED] ondansetron **OR** ondansetron (ZOFRAN) IV, phenol, prochlorperazine  Physical Exam         Constitutional: He is oriented to person, place, and time. He appears well-developed and well-nourished. No distress.  HENT:  Head: Normocephalic and atraumatic.  Mouth/Throat: Mucous membranes are moist. No oropharyngeal exudate.  Eyes: EOM are normal.  Neck: Normal range of motion.  Abdominal: He exhibits distension and ascites. Bowel sounds are decreased. There is generalized tenderness (better compared to yesterday). Musculoskeletal: Normal range of motion.  Neurological: He is alert and oriented to person, place, and time.  Skin: Skin is  warm and dry. There is pallor.  Psychiatric: He has a normal mood and affect. His behavior is normal. Judgment and thought content normal.   Vital Signs: BP (!) 154/89 (BP Location: Right Arm)   Pulse 84   Temp 97.9 F (36.6 C) (Oral)   Resp 18   Ht 6' 3"  (1.905 m)   Wt 104.3 kg (230 lb)   SpO2 95%   BMI 28.75 kg/m  SpO2: SpO2: 95 % O2 Device: O2 Device: Not Delivered O2 Flow Rate:    Intake/output summary:   Intake/Output Summary (Last 24 hours) at 03/09/16 1253 Last data filed at 03/09/16 0554  Gross per 24 hour  Intake            912.5 ml  Output             2602 ml  Net          -1689.5 ml   LBM: Last BM Date: 03/08/16; pt reported BM early this morning! Baseline Weight: Weight: 104.3 kg (230 lb) Most recent weight: Weight: 104.3 kg (230 lb)  Flowsheet Rows   Flowsheet Row Most Recent Value  Intake Tab  Referral Department  Surgery  Unit at Time of Referral  Med/Surg Unit  Palliative Care Primary Diagnosis  Cancer  Date Notified  03/06/16  Palliative Care Type  New Palliative care  Reason for referral  Clarify Goals of Care  Date of Admission  03/06/16  Date first seen by Palliative Care  03/07/16  # of days Palliative referral response time  1 Day(s)  # of days IP prior to Palliative referral  0  Clinical Assessment  Psychosocial & Spiritual Assessment  Palliative Care Outcomes      Patient Active Problem List   Diagnosis Date Noted  . Protein-calorie malnutrition, severe 03/07/2016  . Palliative care by specialist   . 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 Small bowel obstruction in setting of carcinomatosis 03/06/2016  . SBO (small bowel obstruction) 03/06/2016  . Goals of care, counseling/discussion 03/06/2016  . Deviated nasal septum 03/06/2016  . Malignant ascites 03/01/2016  . 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 Abdominal pain 02/26/2016  . Transaminitis  02/26/2016  . Dysuria 02/26/2016  . Blepharitis of both eyes 02/12/2012  . Droopy eyelid 02/12/2012  . Bilateral dry eyes 02/12/2012    Palliative Care Assessment & Plan   HPI: 71 y.o. 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 He subsequently had an ERCP with biliary stent placed d/t stricture in CBD, pleurx placed for management of his recurrent ascites, and PAC placed for planned palliative systemic chemotherapy. PET scan still pending. He then presented to ED with nausea and vomiting for 24 hours and was admitted on 03/06/2016 after CT abd showed likely small bowel obstruction. 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 Palliative consult for goals of care conversation.  Assessment: I met with Matthew Waller and his wife on 2/1 for an initial consult. Please see the consult note for full discussion.   Today, I followed-up on symptom management. He continues to feel better, and had another bowel movement early this morning. The plan is to currently hold on replacing the NG tube and continue to monitor. With two good nights of sleep his voice has also normalized (previously hoarse). They are still waiting for PT to evaluate. Yesterday, his wife expressed concern for when he gets home. Prior to admission he had struggled to get out of bed from a flat lying position, and his wife was concerned that she couldn't help him. She is also concerned he may need assistive devices to aid him in mobility.   Recommendations/Plan:  Full code with full scope interventions  PT consulted for evaluation on 2/2, evaluation still pending  Symptoms ? Abdominal pain: Robinul 0.14m q4h PRN (antimuscarinic to aid in reduction of colicky pain from smooth muscle spasm and bowel wall distension); Dilaudid 0.534mIV q4H PRN (for severe pain,  avoid morphine given rising creatinine and risk for toxic metabolite build-up) ? GI secretions/inflammation: Decadron and Famotidine (decadron reduced to 25m108mID today; plan to taper off in the next 1-2 days as tolerated) ? Nausea: PRN IV Zofran and PRN IV Compazine  Would avoid Reglan in setting of complete obstruction as it can cause significant cramping with no benefit ? Sleep: PRN IV benadryl and PRN IV ativan  Goals of Care and Additional Recommendations:  Limitations on Scope of Treatment: Full Scope Treatment  Code Status:  Full code  Prognosis:   Unable to determine  Discharge Planning:  Home with [facility] discussed with pt and wife.  Thank you for allowing the Palliative Medicine Team to assist in the care of this patient.  Total time: 15 minutes    Greater than 50%  of this time was spent  counseling and coordinating care related to the above assessment and plan.  Charlynn Court, NP Palliative Medicine Team [phone] pager (7a-5p) Team Phone # [phone]

## 2016-03-09 NOTE — Progress Notes (Signed)
PROGRESS NOTE    Matthew Waller  [id number] DOB: Mar 08, 1945 DOA: 03/06/2016 PCP: [name], MD     Brief Narrative:  Matthew Waller is a 71 y.o. 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 Patient presented to the ER because of persistent nausea vomiting over the last 24 hours. Patient also has been having abdominal discomfort. Patient's last bowel movement was almost a week ago. CT scan of the abdomen shows features concerning for small bowel obstruction with transition point in the distal ileum with possible peritoneal carcinomatosis. On-call general surgeon was consulted. Patient admitted for small bowel obstruction with known history of recent diagnosis of cholangiocarcinoma.   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 Clinically improving, had BM yesterday. Discussed with general surgery today. Will leave NGT out today, monitor. If SBO improving tmrw, may start clear liquid diet. If not improved or worsens with nausea, vomiting, increased abdominal distention, will ask IR to replace NGT.   CKD stage3 -Stable   Severe malnutrition in context of acute  illness/injury  -Appreciate RD  -NPO for now due to SBO    DVT prophylaxis: lovenox  Code Status: Full Family Communication: spoke with wife over the phone during examination  Disposition Plan: pending further improvement, stabilization   Consultants:   Oncology  General Surgery  Palliative care   Procedures:   None  Antimicrobials:   None     Subjective: Patient is quite embarrassed that he has again removed his NGT overnight during sleep. He did have a BM yesterday and had 1L peritoneal fluid removed as well. Overall feeling much better. Denies any nausea, vomiting, abdomen is less distended and less tender as well.    Objective: Vitals:   03/08/16 0546 03/08/16 1528 03/08/16 2108 03/09/16 0554  BP: (!) 149/91 (!) 143/93 (!) 148/83 (!) 154/89  Pulse: 84 85 76 84  Resp: 18 18 18 18   Temp: 97.2 F (36.2 C) 97.4 F (36.3 C) 97.5 F (36.4 C) 97.9 F (36.6 C)  TempSrc: Oral Oral Oral Oral  SpO2: 93% 94% 96% 95%  Weight:      Height:        Intake/Output Summary (Last 24 hours) at 03/09/16 1133 Last data filed at 03/09/16 0554  Gross per 24 hour  Intake            912.5 ml  Output             2602 ml  Net          -1689.5 ml   Filed Weights   03/06/16 2206  Weight: 104.3 kg (230 lb)    Examination:  General exam: Appears calm, comfortable   Respiratory system: Clear to auscultation. Respiratory effort normal. Cardiovascular system: S1 & S2 heard,  RRR. No JVD, murmurs, rubs, gallops or clicks. No pedal edema. Gastrointestinal system: Abdomen is distended but improved from yesterday's exam, not TTP, +PleurX in place   Central nervous system: Alert and oriented. No focal neurological deficits. Extremities: Symmetric 5 x 5 power. Skin: No rashes, lesions or ulcers Psychiatry: Judgement and insight appear normal. Mood & affect appropriate.   Data Reviewed: I have personally reviewed following labs and imaging studies  CBC:  Recent Labs Lab  03/06/16 0922 03/06/16 1730 03/07/16 0537 03/08/16 0522 03/09/16 0658  WBC 12.9* 12.3* 12.4* 11.9* 11.8*  NEUTROABS 10.3*  --   --  9.9* 9.6*  HGB 14.3 14.1 13.5 13.5 13.4  HCT 41.4 40.6 39.3 40.6 40.4  MCV 98.6 97.6 98.5 100.2* 102.0*  PLT 429* 422* 330 256 [id number]   Basic Metabolic Panel:  Recent Labs Lab 03/06/16 0922 03/06/16 1730 03/07/16 0537 03/08/16 0522 03/09/16 0658  NA 132* 134* 135 138 144  K 4.9 4.6 4.6 4.5 4.8  CL 90* 91* 92* 99* 107  CO2 28 32 31 29 29   GLUCOSE 152* 144* 135* 136* 120*  BUN 38* 42* 38* 45* 45*  CREATININE 1.34* 1.36* 1.40* 1.39* 1.49*  CALCIUM 8.9 8.5* 8.1* 8.0* 8.0*   GFR: Estimated Creatinine Clearance: 57.7 mL/min (by C-G formula based on SCr of 1.49 mg/dL (H)). Liver Function Tests:  Recent Labs Lab 03/06/16 1730  AST 45*  ALT 36  ALKPHOS 107  BILITOT 0.8  PROT 6.7  ALBUMIN 2.3*    Recent Labs Lab 03/06/16 1730  LIPASE 21   No results for input(s): AMMONIA in the last 168 hours. Coagulation Profile:  Recent Labs Lab 03/06/16 0922  INR 1.26   Cardiac Enzymes: No results for input(s): CKTOTAL, CKMB, CKMBINDEX, TROPONINI in the last 168 hours. BNP (last 3 results) No results for input(s): PROBNP in the last 8760 hours. HbA1C: No results for input(s): HGBA1C in the last 72 hours. CBG:  Recent Labs Lab 03/07/16 1506 03/08/16 0011 03/08/16 0749 03/08/16 2104 03/09/16 0735  GLUCAP 147* 135* 127* 107* 111*   Lipid Profile: No results for input(s): CHOL, HDL, LDLCALC, TRIG, CHOLHDL, LDLDIRECT in the last 72 hours. Thyroid Function Tests: No results for input(s): TSH, T4TOTAL, FREET4, T3FREE, THYROIDAB in the last 72 hours. Anemia Panel: No results for input(s): VITAMINB12, FOLATE, FERRITIN, TIBC, IRON, RETICCTPCT in the last 72 hours. Sepsis Labs:  Recent Labs Lab 03/06/16 1757  LATICACIDVEN 1.60    No results found for this or any previous visit (from the past 240 hour(s)).     Radiology Studies: Dg  Abd Portable 1v-small Bowel Obstruction Protocol-initial, 8 Hr Delay  Result Date: 03/08/2016 CLINICAL DATA:  Small bowel obstruction, 8 hour follow-up. EXAM: PORTABLE ABDOMEN - 1 VIEW COMPARISON:  CT abdomen and pelvis March 06, 2016 FINDINGS: Contrast within proximal small bowel which is dilated to 5 cm. Gas distended small bowel RIGHT lower quadrant measuring up to 4.5 cm. Surgical drain upper abdomen. Biliary stent. No intra-abdominal mass effect or pathologic calcifications. Soft tissue planes and included osseous structures are nonsuspicious. IMPRESSION: Small bowel obstruction, limited contrast transit to proximal small bowel dilated at 5 cm. Electronically Signed   By: Elon Alas M.D.   On: 03/08/2016 05:30   Dg Loyce Dys Tube Plc W/fl W/rad  Result Date: 03/08/2016 CLINICAL DATA:  NG tube placement EXAM: NASO G TUBE PLACEMENT WITH FL AND WITH RAD FLUOROSCOPY TIME:  Fluoroscopy Time:  2 minutes 2 seconds Radiation Exposure Index (if provided by  the fluoroscopic device): 8.9 mGy Number of Acquired Spot Images: 0 COMPARISON:  None. FINDINGS: NG tube was placed under fluoroscopic guidance, with the tip and side-port in the mid stomach. IMPRESSION: NG tube placed into the stomach. Electronically Signed   By: Rolm Baptise M.D.   On: 03/08/2016 15:23   Dg Addison Bailey G Tube Plc W/fl W/rad  Result Date: 03/07/2016 CLINICAL DATA:  Bowel obstruction with nausea. Therapeutic decompression. EXAM: NASO G TUBE PLACEMENT WITH FL AND WITH RAD FLUOROSCOPY TIME:  Fluoroscopy Time:  2 minutes 18 seconds Radiation Exposure Index (if provided by the fluoroscopic device): 19 mGy Number of Acquired Spot Images: 0 COMPARISON:  None. FINDINGS: The nose was anesthetized with viscous lidocaine. A nasogastric tube was inserted through the right nostril and easily advanced to the GE junction. At this point, there was resistance to tube passage. Attempt was made to pass a soft tip wire through the tube, but the wire tip  preferentially track through side-port. Ultimately, the patient was sat up, and the tube was easily advanced and positioned in the stomach. Copious succus was aspirated. No indication of complication. IMPRESSION: Successful nasogastric tube placement. Electronically Signed   By: Monte Fantasia M.D.   On: 03/07/2016 17:27      Scheduled Meds: . dexamethasone  4 mg Intravenous Q12H   And  . famotidine (PEPCID) IV  20 mg Intravenous Q12H  . lip balm  1 application Topical BID   Continuous Infusions: . sodium chloride 50 mL/hr at 03/08/16 1459     LOS: 3 days    Time spent: 30 minutes   Dessa Phi, DO [facility] [email or web address] Password [facility] 03/09/2016, 11:33 AM

## 2016-03-09 NOTE — Progress Notes (Signed)
Patient pulled NG tube while asleep despite mittens in place. This RN attempted to reinsert, unsuccessful. Pt stated that last night when he did this they had to "use X ray to guide it in." On call notified.

## 2016-03-09 NOTE — Progress Notes (Signed)
[place] Surgery Office:  [phone] General Surgery Progress Note   LOS: 3 days  POD -     Assessment/Plan: 1.  SBO - secondary to carcinomatosis  NGT out - had BM  Will leave NGT out and see how he does.  I am skeptical how well he will do, but he has pulled the NGT out twice by accident and he seems to be doing better.  If he does well with NGT out today, can consider clear liquids tomorrow.  2.  Cholangiocarcinoma  Biliary stent - P. Hung - 02/28/2016  Pleurx catheter for malignant ascites  Seen by Dr. Burr Medico 3.  CKD, stage III  Creat - 1.49 - 03/09/2016  4.  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  Had BM.  Discussed treatment plan with him.  Objective:   Vitals:   03/08/16 2108 03/09/16 0554  BP: (!) 148/83 (!) 154/89  Pulse: 76 84  Resp: 18 18  Temp: 97.5 F (36.4 C) 97.9 F (36.6 C)     Intake/Output from previous day:  02/02 0701 - 02/03 0700 In: 912.5 [I.V.:762.5; IV Piggyback:150] Out: 2602 [Urine:651; Emesis/NG output:950; Drains:1000; Stool:1]  Intake/Output this shift:  No intake/output data recorded.   Physical Exam:   General: WN WM who is alert and oriented. He does not look ill.   HEENT: Normal. Pupils equal. .   Lungs: Clear.   Abdomen: Pleurx catheter in RUQ.  10+ firm mass in left mid abdomen - probably tumor caked in omentum.  Has good BS.  Mildly distended.   Lab Results:    Recent Labs  03/08/16 0522 03/09/16 0658  WBC 11.9* 11.8*  HGB 13.5 13.4  HCT 40.6 40.4  PLT 256 225    BMET   Recent Labs  03/08/16 0522  03/09/16 0658  NA 138 144  K 4.5 4.8  CL 99* 107  CO2 29 29  GLUCOSE 136* 120*  BUN 45* 45*  CREATININE 1.39* 1.49*  CALCIUM 8.0* 8.0*    PT/INR   Recent Labs  03/06/16 0922  LABPROT 15.9*  INR 1.26    ABG  No results for input(s): PHART, HCO3 in the last 72 hours.  Invalid input(s): PCO2, PO2   Studies/Results:  Dg Abd Portable 1v-small Bowel Obstruction Protocol-initial, 8 Hr Delay  Result Date: 03/08/2016 CLINICAL DATA:  Small bowel obstruction, 8 hour follow-up. EXAM: PORTABLE ABDOMEN - 1 VIEW COMPARISON:  CT abdomen and pelvis March 06, 2016 FINDINGS: Contrast within proximal small bowel which is dilated to 5 cm. Gas distended small bowel RIGHT lower quadrant measuring up to 4.5 cm. Surgical drain upper abdomen. Biliary stent. No intra-abdominal mass effect or pathologic calcifications. Soft tissue planes and included osseous structures are nonsuspicious. IMPRESSION: Small bowel obstruction, limited contrast transit to proximal small bowel dilated at 5 cm. Electronically Signed   By: Elon Alas M.D.   On: 03/08/2016 05:30   Dg Loyce Dys Tube Plc W/fl W/rad  Result Date: 03/08/2016 CLINICAL DATA:  NG tube placement EXAM: NASO G TUBE PLACEMENT WITH FL AND WITH RAD FLUOROSCOPY TIME:  Fluoroscopy Time:  2 minutes 2 seconds Radiation Exposure Index (if provided by the fluoroscopic device): 8.9 mGy Number of Acquired Spot Images: 0 COMPARISON:  None. FINDINGS: NG tube was placed under fluoroscopic guidance, with the tip and side-port in the mid stomach. IMPRESSION: NG tube placed into the stomach. Electronically Signed   By: Rolm Baptise M.D.   On: 03/08/2016 15:23   Dg Addison Bailey G Tube Plc W/fl W/rad  Result Date: 03/07/2016 CLINICAL DATA:  Bowel obstruction with nausea. Therapeutic decompression. EXAM: NASO G TUBE PLACEMENT WITH FL AND WITH RAD FLUOROSCOPY TIME:  Fluoroscopy Time:  2 minutes 18 seconds Radiation Exposure Index (if provided by the fluoroscopic device): 19 mGy Number  of Acquired Spot Images: 0 COMPARISON:  None. FINDINGS: The nose was anesthetized with viscous lidocaine. A nasogastric tube was inserted through the right nostril and easily advanced to the GE junction. At this point, there was resistance to tube passage. Attempt was made to pass a soft tip wire through the tube, but the wire tip preferentially track through side-port. Ultimately, the patient was sat up, and the tube was easily advanced and positioned in the stomach. Copious succus was aspirated. No indication of complication. IMPRESSION: Successful nasogastric tube placement. Electronically Signed   By: Monte Fantasia M.D.   On: 03/07/2016 17:27     Anti-infectives:   Anti-infectives    None      Alphonsa Overall, MD, FACS Pager: [phone] Surgery Office: [phone] 03/09/2016

## 2016-03-09 NOTE — Progress Notes (Signed)
Lovenox dosing for VTE prophylaxis requested per Pharmacy dosing  TBW 104.3 kg, BMI < 30 Cl > 30 ml/min  CBC wnl  Lovenox 40mg  daily  Pharmacy will sign off protocol.  Thank you,   Minda Ditto PharmD Pager [phone] 03/09/2016, 11:54 AM

## 2016-03-10 LAB — BASIC METABOLIC PANEL
Anion gap: 8 (ref 5–15)
BUN: 43 mg/dL — ABNORMAL HIGH (ref 6–20)
CHLORIDE: 105 mmol/L (ref 101–111)
CO2: 28 mmol/L (ref 22–32)
CREATININE: 1.36 mg/dL — AB (ref 0.61–1.24)
Calcium: 7.9 mg/dL — ABNORMAL LOW (ref 8.9–10.3)
GFR calc Af Amer: 58 mL/min — ABNORMAL LOW (ref >60)
GFR calc non Af Amer: 50 mL/min — ABNORMAL LOW (ref >60)
Glucose, Bld: 108 mg/dL — ABNORMAL HIGH (ref 65–99)
POTASSIUM: 4.3 mmol/L (ref 3.5–5.1)
Sodium: 141 mmol/L (ref 135–145)

## 2016-03-10 LAB — CBC WITH DIFFERENTIAL/PLATELET
Basophils Absolute: 0 10*3/uL (ref 0.0–0.1)
Basophils Relative: 0 %
EOS ABS: 0 10*3/uL (ref 0.0–0.7)
Eosinophils Relative: 0 %
HEMATOCRIT: 41.6 % (ref 39.0–52.0)
HEMOGLOBIN: 13.7 g/dL (ref 13.0–17.0)
Lymphocytes Relative: 10 %
Lymphs Abs: 1.2 10*3/uL (ref 0.7–4.0)
MCH: 33.2 pg (ref 26.0–34.0)
MCHC: 32.9 g/dL (ref 30.0–36.0)
MCV: 100.7 fL — AB (ref 78.0–100.0)
MONOS PCT: 10 %
Monocytes Absolute: 1.2 10*3/uL — ABNORMAL HIGH (ref 0.1–1.0)
NEUTROS ABS: 9.7 10*3/uL — AB (ref 1.7–7.7)
NEUTROS PCT: 80 %
Platelets: 221 10*3/uL (ref 150–400)
RBC: 4.13 MIL/uL — ABNORMAL LOW (ref 4.22–5.81)
RDW: 13.5 % (ref 11.5–15.5)
WBC: 12.1 10*3/uL — ABNORMAL HIGH (ref 4.0–10.5)

## 2016-03-10 LAB — GLUCOSE, CAPILLARY
Glucose-Capillary: 138 mg/dL — ABNORMAL HIGH (ref 65–99)
Glucose-Capillary: 134 mg/dL — ABNORMAL HIGH (ref 65–99)

## 2016-03-10 NOTE — Progress Notes (Addendum)
PROGRESS NOTE    Matthew Waller  [id number] DOB: [birth year] DOA: 03/06/2016 PCP: [name], MD     Brief Narrative:  Matthew Waller is a 71 y.o. 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 Patient presented to the ER because of persistent nausea vomiting over the last 24 hours. Patient also has been having abdominal discomfort. Patient's last bowel movement was almost a week ago. CT scan of the abdomen shows features concerning for small bowel obstruction with transition point in the distal ileum with possible peritoneal carcinomatosis. On-call general surgeon was consulted. Patient admitted for small bowel obstruction with known history of recent diagnosis of cholangiocarcinoma.   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 Clinically improving, having loose Bm, without nausea, vomiting, abdominal pain is much better. Advanced to clear liquid diet today   CKD stage3 -Stable   Severe malnutrition in context of acute illness/injury  -Appreciate RD    DVT prophylaxis: lovenox  Code Status: Full Family Communication: spoke with wife over the phone during  examination  Disposition Plan: pending further improvement, stabilization   Consultants:   Oncology  General Surgery  Palliative care   Procedures:   None  Antimicrobials:   None     Subjective: No issues overnight. Had 2 BM yesterday. Denies any nausea, vomiting, abdominal pain.    Objective: Vitals:   03/09/16 0554 03/09/16 1334 03/09/16 2059 03/10/16 0446  BP: (!) 154/89 139/88 (!) 151/86 (!) 146/88  Pulse: 84 73 73 76  Resp: 18 18 18 18   Temp: 97.9 F (36.6 C) 98.1 F (36.7 C) 97.9 F (36.6 C) 97.8 F (36.6 C)  TempSrc: Oral Axillary Oral Axillary  SpO2: 95% 98% 100% 97%  Weight:      Height:        Intake/Output Summary (Last 24 hours) at 03/10/16 1052 Last data filed at 03/10/16 0945  Gross per 24 hour  Intake             1515 ml  Output              250 ml  Net             1265 ml   Filed Weights   03/06/16 2206  Weight: 104.3 kg (230 lb)    Examination:  General exam: Appears calm, comfortable   Respiratory system: Clear to auscultation. Respiratory effort normal. Cardiovascular system: S1 & S2 heard, RRR. No JVD, murmurs, rubs, gallops or clicks. No pedal edema. Gastrointestinal system: Abdomen is distended but soft, not TTP, +PleurX in place   Central nervous system: Alert and oriented. No focal neurological deficits. Extremities: Symmetric 5 x 5 power. Skin: No rashes, lesions or ulcers Psychiatry: Judgement and insight appear normal. Mood & affect appropriate.  Data Reviewed: I have personally reviewed following labs and imaging studies  CBC:  Recent Labs Lab 03/06/16 0922 03/06/16 1730 03/07/16 0537 03/08/16 0522 03/09/16 0658 03/10/16 0626  WBC 12.9* 12.3* 12.4* 11.9* 11.8* 12.1*  NEUTROABS 10.3*  --   --  9.9* 9.6* 9.7*  HGB 14.3 14.1 13.5 13.5 13.4 13.7  HCT 41.4 40.6 39.3 40.6 40.4 41.6  MCV 98.6 97.6 98.5 100.2* 102.0* 100.7*  PLT 429* 422* 330 256 225 [id number]   Basic Metabolic Panel:  Recent Labs Lab 03/06/16 1730  03/07/16 0537 03/08/16 0522 03/09/16 0658 03/10/16 0626  NA 134* 135 138 144 141  K 4.6 4.6 4.5 4.8 4.3  CL 91* 92* 99* 107 105  CO2 32 31 29 29 28   GLUCOSE 144* 135* 136* 120* 108*  BUN 42* 38* 45* 45* 43*  CREATININE 1.36* 1.40* 1.39* 1.49* 1.36*  CALCIUM 8.5* 8.1* 8.0* 8.0* 7.9*   GFR: Estimated Creatinine Clearance: 63.2 mL/min (by C-G formula based on SCr of 1.36 mg/dL (H)). Liver Function Tests:  Recent Labs Lab 03/06/16 1730  AST 45*  ALT 36  ALKPHOS 107  BILITOT 0.8  PROT 6.7  ALBUMIN 2.3*    Recent Labs Lab 03/06/16 1730  LIPASE 21   No results for input(s): AMMONIA in the last 168 hours. Coagulation Profile:  Recent Labs Lab 03/06/16 0922  INR 1.26   Cardiac Enzymes: No results for input(s): CKTOTAL, CKMB, CKMBINDEX, TROPONINI in the last 168 hours. BNP (last 3 results) No results for input(s): PROBNP in the last 8760 hours. HbA1C: No results for input(s): HGBA1C in the last 72 hours. CBG:  Recent Labs Lab 03/08/16 0749 03/08/16 2104 03/09/16 0735 03/09/16 1602 03/09/16 2107  GLUCAP 127* 107* 111* 119* 108*   Lipid Profile: No results for input(s): CHOL, HDL, LDLCALC, TRIG, CHOLHDL, LDLDIRECT in the last 72 hours. Thyroid Function Tests: No results for input(s): TSH, T4TOTAL, FREET4, T3FREE, THYROIDAB in the last 72 hours. Anemia Panel: No results for input(s): VITAMINB12, FOLATE, FERRITIN, TIBC, IRON, RETICCTPCT in the last 72 hours. Sepsis Labs:  Recent Labs Lab 03/06/16 1757  LATICACIDVEN 1.60    No results found for this or any previous visit (from the past 240 hour(s)).     Radiology Studies: Dg Loyce Dys Tube Plc W/fl W/rad  Result Date: 03/08/2016 CLINICAL DATA:  NG tube placement EXAM: NASO G TUBE PLACEMENT WITH FL AND WITH RAD FLUOROSCOPY TIME:  Fluoroscopy Time:  2 minutes 2 seconds Radiation Exposure Index (if provided by the fluoroscopic device): 8.9 mGy Number of Acquired Spot Images: 0 COMPARISON:  None. FINDINGS: NG  tube was placed under fluoroscopic guidance, with the tip and side-port in the mid stomach. IMPRESSION: NG tube placed into the stomach. Electronically Signed   By: Rolm Baptise M.D.   On: 03/08/2016 15:23      Scheduled Meds: . dexamethasone  2 mg Intravenous Q12H   And  . famotidine (PEPCID) IV  20 mg Intravenous Q12H  . enoxaparin (LOVENOX) injection  40 mg Subcutaneous Q24H  . lip balm  1 application Topical BID   Continuous Infusions: . sodium chloride 50 mL/hr at 03/10/16 0400     LOS: 4 days    Time spent: 30 minutes   Dessa Phi, DO [facility] [email or web address] Password [email or web address] 03/10/2016, 10:52 AM

## 2016-03-10 NOTE — Progress Notes (Signed)
Matthew Waller:  [phone] General Surgery Progress Note   LOS: 4 days  POD -     Assessment/Plan: 1.  SBO - secondary to carcinomatosis  Clinically continuing to do better.  He has had clear liquids, which he has tolerated, and he's had some loose BMs  2.  Cholangiocarcinoma  Biliary stent - P. Hung - 02/28/2016  Pleurx catheter for malignant ascites  Seen by Dr. Burr Medico 3.  CKD, stage III  Creat - 1.36 - 03/10/2016  4.  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  Has had a couple of BM's.  Once he did not make it to the toilet in time.  In the room by himself.  Objective:   Vitals:   03/09/16 2059 03/10/16 0446  BP: (!) 151/86 (!) 146/88  Pulse: 73 76  Resp: 18 18  Temp: 97.9 F (36.6 C) 97.8 F (36.6 C)     Intake/Output from previous day:  02/03 0701 - 02/04 0700 In: 1175 [I.V.:1125; IV Piggyback:50] Out: 250 [Urine:250]  Intake/Output this shift:  No intake/output data recorded.   Physical Exam:   General: WN WM who is alert and oriented. He does not look ill.   HEENT: Normal. Pupils equal. .   Lungs: Clear.   Abdomen: Pleurx catheter in RUQ.  10+ firm mass in left mid abdomen - probably tumor caked in omentum.     Lab Results:     Recent Labs  03/09/16 0658 03/10/16 0626  WBC 11.8* 12.1*  HGB 13.4 13.7  HCT 40.4 41.6  PLT 225 221    BMET    Recent Labs  03/09/16 0658 03/10/16 0626  NA 144 141  K 4.8 4.3  CL 107 105  CO2 29 28  GLUCOSE 120* 108*  BUN 45* 43*  CREATININE 1.49* 1.36*  CALCIUM 8.0* 7.9*     PT/INR  No results for input(s): LABPROT, INR in the last 72 hours.  ABG  No results for input(s): PHART, HCO3 in the last 72 hours.  Invalid input(s): PCO2, PO2   Studies/Results:  Dg Naso G Tube Plc W/fl W/rad  Result Date: 03/08/2016 CLINICAL DATA:  NG tube placement EXAM: NASO G TUBE PLACEMENT WITH FL AND WITH RAD FLUOROSCOPY TIME:  Fluoroscopy Time:  2 minutes 2 seconds Radiation Exposure Index (if provided by the fluoroscopic device): 8.9 mGy Number of Acquired Spot Images: 0 COMPARISON:  None. FINDINGS: NG tube was placed under fluoroscopic guidance, with the tip and side-port in the mid stomach. IMPRESSION: NG tube placed into the stomach. Electronically Signed   By: Rolm Baptise M.D.   On: 03/08/2016 15:23     Anti-infectives:   Anti-infectives    None      Alphonsa Overall, MD, FACS Pager: [phone] Surgery Waller: [phone] 03/10/2016

## 2016-03-11 ENCOUNTER — Other Ambulatory Visit (HOSPITAL_COMMUNITY): Payer: Medicare Other

## 2016-03-11 ENCOUNTER — Ambulatory Visit (HOSPITAL_COMMUNITY): Payer: PPO

## 2016-03-11 ENCOUNTER — Other Ambulatory Visit: Payer: Medicare Other

## 2016-03-11 ENCOUNTER — Inpatient Hospital Stay: Payer: Medicare Other | Admitting: Hematology

## 2016-03-11 ENCOUNTER — Other Ambulatory Visit: Payer: Self-pay

## 2016-03-11 LAB — BASIC METABOLIC PANEL
Anion gap: 9 (ref 5–15)
BUN: 39 mg/dL — AB (ref 6–20)
CHLORIDE: 105 mmol/L (ref 101–111)
CO2: 26 mmol/L (ref 22–32)
CREATININE: 1.25 mg/dL — AB (ref 0.61–1.24)
Calcium: 7.9 mg/dL — ABNORMAL LOW (ref 8.9–10.3)
GFR calc Af Amer: >60 mL/min (ref >60)
GFR calc non Af Amer: 55 mL/min — ABNORMAL LOW (ref >60)
Glucose, Bld: 127 mg/dL — ABNORMAL HIGH (ref 65–99)
Potassium: 5.5 mmol/L — ABNORMAL HIGH (ref 3.5–5.1)
SODIUM: 140 mmol/L (ref 135–145)

## 2016-03-11 LAB — CBC WITH DIFFERENTIAL/PLATELET
Basophils Absolute: 0 10*3/uL (ref 0.0–0.1)
Basophils Relative: 0 %
EOS ABS: 0 10*3/uL (ref 0.0–0.7)
EOS PCT: 0 %
HCT: 40.1 % (ref 39.0–52.0)
HEMOGLOBIN: 13.4 g/dL (ref 13.0–17.0)
LYMPHS ABS: 0.9 10*3/uL (ref 0.7–4.0)
Lymphocytes Relative: 8 %
MCH: 33 pg (ref 26.0–34.0)
MCHC: 33.4 g/dL (ref 30.0–36.0)
MCV: 98.8 fL (ref 78.0–100.0)
MONOS PCT: 10 %
Monocytes Absolute: 1.1 10*3/uL — ABNORMAL HIGH (ref 0.1–1.0)
Neutro Abs: 9.2 10*3/uL — ABNORMAL HIGH (ref 1.7–7.7)
Neutrophils Relative %: 82 %
PLATELETS: 197 10*3/uL (ref 150–400)
RBC: 4.06 MIL/uL — ABNORMAL LOW (ref 4.22–5.81)
RDW: 13.4 % (ref 11.5–15.5)
WBC: 11.2 10*3/uL — ABNORMAL HIGH (ref 4.0–10.5)

## 2016-03-11 LAB — GLUCOSE, CAPILLARY
Glucose-Capillary: 135 mg/dL — ABNORMAL HIGH (ref 65–99)
Glucose-Capillary: 140 mg/dL — ABNORMAL HIGH (ref 65–99)

## 2016-03-11 LAB — MAGNESIUM: MAGNESIUM: 2.6 mg/dL — AB (ref 1.7–2.4)

## 2016-03-11 LAB — PHOSPHORUS: Phosphorus: 4.5 mg/dL (ref 2.5–4.6)

## 2016-03-11 MED ORDER — DEXAMETHASONE SODIUM PHOSPHATE 4 MG/ML IJ SOLN
2.0000 mg | Freq: Once | INTRAMUSCULAR | Status: AC
  Administered 2016-03-11: 2 mg via INTRAVENOUS
  Filled 2016-03-11: qty 0.5

## 2016-03-11 MED ORDER — FAMOTIDINE 20 MG PO TABS
20.0000 mg | ORAL_TABLET | Freq: Two times a day (BID) | ORAL | Status: DC
  Administered 2016-03-11 – 2016-03-14 (×6): 20 mg via ORAL
  Filled 2016-03-11 (×7): qty 1

## 2016-03-11 MED ORDER — ENSURE ENLIVE PO LIQD
237.0000 mL | Freq: Three times a day (TID) | ORAL | Status: DC
  Administered 2016-03-11 – 2016-03-16 (×8): 237 mL via ORAL

## 2016-03-11 MED ORDER — FAMOTIDINE IN NACL 20-0.9 MG/50ML-% IV SOLN
20.0000 mg | Freq: Two times a day (BID) | INTRAVENOUS | Status: AC
  Administered 2016-03-11: 20 mg via INTRAVENOUS
  Filled 2016-03-11 (×2): qty 50

## 2016-03-11 NOTE — Progress Notes (Signed)
Daily Progress Note   Patient Name: Matthew Waller       Date: 03/11/2016 DOB: 11-Nov-1945  Age: 71 y.o. MRN#: [id number] Attending Physician: Shon Millet* Primary Care Physician: [name], MD Admit Date: 03/06/2016  Reason for Consultation/Follow-up: Non pain symptom management  Subjective: Mr. Emrich is doing well today.  Has been up and walking.  Reports "worn out" but otherwise OK.  Still not much appetite. Continues to report bowel movements and had clears today.   No vomiting and currently feeling well.  Length of Stay: 5  Current Medications: Scheduled Meds:  . enoxaparin (LOVENOX) injection  40 mg Subcutaneous Q24H  . famotidine (PEPCID) IV  20 mg Intravenous Q12H  . [START ON 03/12/2016] famotidine  20 mg Oral BID  . feeding supplement (ENSURE ENLIVE)  237 mL Oral TID BM  . lip balm  1 application Topical BID    Continuous Infusions:   PRN Meds: diphenhydrAMINE, glycopyrrolate, HYDROmorphone (DILAUDID) injection, LORazepam, magic mouthwash, menthol-cetylpyridinium, methocarbamol (ROBAXIN)  IV, [DISCONTINUED] ondansetron **OR** ondansetron (ZOFRAN) IV, phenol, prochlorperazine  Physical Exam         Constitutional: He is oriented to person, place, and time. He appears well-developed and well-nourished. No distress.  HENT:  Head: Normocephalic and atraumatic.  Mouth/Throat: Mucous membranes are moist. No oropharyngeal exudate.  Eyes: EOM are normal.  Neck: Normal range of motion.  Abdominal: He exhibits distension and ascites. Bowel sounds are decreased. There is generalized tenderness Musculoskeletal: Normal range of motion.  Neurological: He is alert and oriented to person, place, and time.  Skin: Skin is warm and dry. There is pallor.  Psychiatric: He has a normal mood and affect. His behavior is  normal. Judgment and thought content normal.   Vital Signs: BP 133/89 (BP Location: Right Arm)   Pulse 87   Temp 99.1 F (37.3 C) (Axillary)   Resp 20   Ht 6\' 3"  (1.905 m)   Wt 104.3 kg (230 lb)   SpO2 96%   BMI 28.75 kg/m  SpO2: SpO2: 96 % O2 Device: O2 Device: Not Delivered O2 Flow Rate:    Intake/output summary:   Intake/Output Summary (Last 24 hours) at 03/11/16 1757 Last data filed at 03/11/16 1500  Gross per 24 hour  Intake              410 ml  Output                0 ml  Net              410 ml   LBM: Last BM Date: 03/10/16; pt reported BM early this morning! Baseline Weight: Weight: 104.3 kg (230 lb) Most recent weight: Weight: 104.3 kg (230 lb)  Flowsheet Rows   Flowsheet Row Most Recent Value  Intake Tab  Referral Department  Surgery  Unit at Time of Referral  Med/Surg Unit  Palliative Care Primary Diagnosis  Cancer  Date Notified  03/06/16  Palliative Care Type  New Palliative care  Reason for referral  Clarify Goals of Care  Date of Admission  03/06/16  Date first seen by Palliative Care  03/07/16  # of days Palliative referral response time  1 Day(s)  #  of days IP prior to Palliative referral  0  Clinical Assessment  Psychosocial & Spiritual Assessment  Palliative Care Outcomes      Patient Active Problem List   Diagnosis Date Noted  . Protein-calorie malnutrition, severe 03/07/2016  . Palliative care by specialist   . 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 Small bowel obstruction in setting of carcinomatosis 03/06/2016  . SBO (small bowel obstruction) 03/06/2016  . Goals of care, counseling/discussion 03/06/2016  . Deviated nasal septum 03/06/2016  . Malignant ascites 03/01/2016  . 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 Abdominal pain 02/26/2016  . Transaminitis 02/26/2016  . Dysuria 02/26/2016  . Blepharitis of both eyes 02/12/2012  . Droopy eyelid 02/12/2012    . Bilateral dry eyes 02/12/2012    Palliative Care Assessment & Plan   HPI: 71 y.o. 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 He subsequently had an ERCP with biliary stent placed d/t stricture in CBD, pleurx placed for management of his recurrent ascites, and PAC placed for planned palliative systemic chemotherapy. PET scan still pending. He then presented to ED with nausea and vomiting for 24 hours and was admitted on 03/06/2016 after CT abd showed likely small bowel obstruction. 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 Palliative consult for goals of care conversation.  Recommendations/Plan:  Full code with full scope interventions  Continues to improve and tolerating clears.  Will d/c steroids today.  Transition pepcid to oral route.   Goals of Care and Additional Recommendations:  Limitations on Scope of Treatment: Full Scope Treatment  Code Status:  Full code  Prognosis:   Unable to determine  Discharge Planning:  Home with [place] was discussed with pt and wife.  Thank you for allowing the Palliative Medicine Team to assist in the care of this patient.  Total time: 15 minutes    Greater than 50%  of this time was spent counseling and coordinating care related to the above assessment and plan.  Micheline Rough, MD [place] Team [phone]

## 2016-03-11 NOTE — Progress Notes (Signed)
PROGRESS NOTE    Matthew Waller  [id number] DOB: 03-22-1945 DOA: 03/06/2016 PCP: [name], MD     Brief Narrative:  Matthew Waller is a 71 y.o. 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 Patient presented to the ER because of persistent nausea vomiting over the last 24 hours. Patient also has been having abdominal discomfort. Patient's last bowel movement was almost a week ago. CT scan of the abdomen shows features concerning for small bowel obstruction with transition point in the distal ileum with possible peritoneal carcinomatosis. On-call general surgeon was consulted. Patient admitted for small bowel obstruction with known history of recent diagnosis of cholangiocarcinoma.   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 Clinically improving, having loose BM, without nausea, vomiting, abdominal pain is much better. Tolerating clear liquid diet, advance to full liquid today   Hyperkalemia -Mild, EKG without acute ST-T changes -Trend   CKD stage 3 -Stable   Severe malnutrition in context of acute illness/injury  -Appreciate RD    DVT prophylaxis:  lovenox  Code Status: Full Family Communication: spoke with wife over the phone during examination  Disposition Plan: pending further improvement, stabilization   Consultants:   Oncology  General Surgery  Palliative care   Procedures:   None  Antimicrobials:   None     Subjective: No issues overnight. Denies any nausea, vomiting, abdominal pain. Tolerating CLD.    Objective: Vitals:   03/10/16 0446 03/10/16 1354 03/10/16 2113 03/11/16 0429  BP: (!) 146/88 (!) 146/88 130/85 (!) 143/92  Pulse: 76 87 84 81  Resp: 18 18 18 18   Temp: 97.8 F (36.6 C) 97.9 F (36.6 C) 97.8 F (36.6 C) 97.7 F (36.5 C)  TempSrc: Axillary Axillary Oral Oral  SpO2: 97% 97% 96% 94%  Weight:      Height:        Intake/Output Summary (Last 24 hours) at 03/11/16 1145 Last data filed at 03/11/16 0906  Gross per 24 hour  Intake              120 ml  Output                0 ml  Net              120 ml   Filed Weights   03/06/16 2206  Weight: 104.3 kg (230 lb)    Examination:  General exam: Appears calm, comfortable   Respiratory system: Clear to auscultation. Respiratory effort normal. Cardiovascular system: S1 & S2 heard, RRR. No JVD, murmurs, rubs, gallops or clicks. No pedal edema. Gastrointestinal system: Abdomen is distended but soft, not TTP, +PleurX in place   Central nervous system: Alert and oriented. No focal neurological deficits. Extremities: Symmetric 5 x 5 power. Skin:  No rashes, lesions or ulcers Psychiatry: Judgement and insight appear normal. Mood & affect appropriate.   Data Reviewed: I have personally reviewed following labs and imaging studies  CBC:  Recent Labs Lab 03/06/16 0922  03/07/16 0537 03/08/16 0522 03/09/16 0658 03/10/16 0626 03/11/16 0516  WBC 12.9*  < > 12.4* 11.9* 11.8* 12.1* 11.2*  NEUTROABS 10.3*  --   --  9.9* 9.6* 9.7* 9.2*  HGB 14.3  < > 13.5 13.5 13.4 13.7 13.4  HCT 41.4  < > 39.3 40.6 40.4 41.6 40.1  MCV 98.6  < > 98.5 100.2*  102.0* 100.7* 98.8  PLT 429*  < > 330 256 225 221 197  < > = values in this interval not displayed. Basic Metabolic Panel:  Recent Labs Lab 03/07/16 0537 03/08/16 0522 03/09/16 0658 03/10/16 0626 03/11/16 0516  NA 135 138 144 141 140  K 4.6 4.5 4.8 4.3 5.5*  CL 92* 99* 107 105 105  CO2 31 29 29 28 26   GLUCOSE 135* 136* 120* 108* 127*  BUN 38* 45* 45* 43* 39*  CREATININE 1.40* 1.39* 1.49* 1.36* 1.25*  CALCIUM 8.1* 8.0* 8.0* 7.9* 7.9*  MG  --   --   --   --  2.6*  PHOS  --   --   --   --  4.5   GFR: Estimated Creatinine Clearance: 68.8 mL/min (by C-G formula based on SCr of 1.25 mg/dL (H)). Liver Function Tests:  Recent Labs Lab 03/06/16 1730  AST 45*  ALT 36  ALKPHOS 107  BILITOT 0.8  PROT 6.7  ALBUMIN 2.3*    Recent Labs Lab 03/06/16 1730  LIPASE 21   No results for input(s): AMMONIA in the last 168 hours. Coagulation Profile:  Recent Labs Lab 03/06/16 0922  INR 1.26   Cardiac Enzymes: No results for input(s): CKTOTAL, CKMB, CKMBINDEX, TROPONINI in the last 168 hours. BNP (last 3 results) No results for input(s): PROBNP in the last 8760 hours. HbA1C: No results for input(s): HGBA1C in the last 72 hours. CBG:  Recent Labs Lab 03/09/16 0735 03/09/16 1602 03/09/16 2107 03/10/16 1601 03/10/16 2110  GLUCAP 111* 119* 108* 138* 134*   Lipid Profile: No results for input(s): CHOL, HDL, LDLCALC, TRIG, CHOLHDL, LDLDIRECT in the last 72 hours. Thyroid Function Tests: No results for input(s): TSH, T4TOTAL, FREET4, T3FREE, THYROIDAB in the last 72 hours. Anemia Panel: No results for input(s): VITAMINB12, FOLATE, FERRITIN, TIBC, IRON, RETICCTPCT in the last 72 hours. Sepsis Labs:  Recent Labs Lab 03/06/16 1757  LATICACIDVEN 1.60    No results found for this or any previous visit (from the past 240 hour(s)).     Radiology Studies: No results found.    Scheduled Meds: . dexamethasone  2 mg Intravenous Once   And  . famotidine (PEPCID) IV   20 mg Intravenous Q12H  . enoxaparin (LOVENOX) injection  40 mg Subcutaneous Q24H  . feeding supplement (ENSURE ENLIVE)  237 mL Oral TID BM  . lip balm  1 application Topical BID   Continuous Infusions:    LOS: 5 days    Time spent: 30 minutes   Dessa Phi, DO [facility] [email or web address] Password [email or web address] 03/11/2016, 11:45 AM

## 2016-03-11 NOTE — Progress Notes (Signed)
  Subjective: Tolerating clear liquids.  Passing flatus and having some stools.  No nausea or vomiting. Says Dr. Burr Medico is going to discuss overall treatment plan again with him today.  No fever or tachycardia. Creatinine 1.25.  Potassium 5.5.  WBC 11,200, stable.  Hemoglobin 13.4.  Stable.    Objective: Vital signs in last 24 hours: Temp:  [97.7 F (36.5 C)-97.9 F (36.6 C)] 97.7 F (36.5 C) (02/05 0429) Pulse Rate:  [81-87] 81 (02/05 0429) Resp:  [18] 18 (02/05 0429) BP: (130-146)/(85-92) 143/92 (02/05 0429) SpO2:  [94 %-97 %] 94 % (02/05 0429) Last BM Date: 03/08/16  Intake/Output from previous day: 02/04 0701 - 02/05 0700 In: 340 [P.O.:340] Out: -  Intake/Output this shift: No intake/output data recorded.  General appearance: alert.  Sitting in chair.  No distress. Resp:   clear to auscultation  GI: Pleurx catheter in RUQ.  Firm mass left mid abdomen.  Probably caked in omentum.    Lab Results:   Recent Labs  03/10/16 0626 03/11/16 0516  WBC 12.1* 11.2*  HGB 13.7 13.4  HCT 41.6 40.1  PLT 221 197   BMET  Recent Labs  03/10/16 0626 03/11/16 0516  NA 141 140  K 4.3 5.5*  CL 105 105  CO2 28 26  GLUCOSE 108* 127*  BUN 43* 39*  CREATININE 1.36* 1.25*  CALCIUM 7.9* 7.9*   PT/INR No results for input(s): LABPROT, INR in the last 72 hours. ABG No results for input(s): PHART, HCO3 in the last 72 hours.  Invalid input(s): PCO2, PO2  Studies/Results: No results found.  Anti-infectives: Anti-infectives    None      Assessment/Plan:   1.  SBO - secondary to carcinomatosis             Clinically continuing to do better.  He has had clear liquids, which he has tolerated, and he's had some loose BMs             We will offer full liquids as tolerated             Very little to offer from general surgical standpoint.             Tumor burden is significant.  Not an operative candidate              Will follow from a distance                2.   Cholangiocarcinoma             Biliary stent - P. Hung - 02/28/2016             Pleurx catheter for malignant ascites             Seen by Dr. Burr Medico  3.  CKD, stage III             Creat - 1.36 - 03/10/2016  4.  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

## 2016-03-11 NOTE — Progress Notes (Signed)
Nutrition Follow-up  DOCUMENTATION CODES:   Severe malnutrition in context of acute illness/injury  INTERVENTION:   -Ensure Enlive po TID, each supplement provides 350 kcal and 20 grams of protein  NUTRITION DIAGNOSIS:   Malnutrition related to cancer and cancer related treatments, acute illness, poor appetite, vomiting, nausea as evidenced by moderate depletions of muscle mass, mild depletion of body fat, energy intake < or equal to 50% for > or equal to 5 days.  Ongoing  GOAL:   Patient will meet greater than or equal to 90% of their needs  Progressing  MONITOR:   Diet advancement, Labs, Skin, Weight trends  REASON FOR ASSESSMENT:   Malnutrition Screening Tool    ASSESSMENT:   71 y.o. 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 Patient presented to the ER because of persistent nausea vomiting over the last 24 hours. Patient also has been having abdominal discomfort. Patient's last bowel movement was almost a week ago. CT scan of the abdomen shows features concerning for small bowel obstruction with transition point in the distal ileum with possible peritoneal carcinomatosis. On-call general surgeon Dr. [name] was consulted. Patient is being admitted for small bowel obstruction with known history of recent diagnosis of cholangiocarcinoma.  Pt pulled NGT x 2. Per surgery notes, pt was advanced to clear liquid diet due to having BMs.  Pt advanced to full liquid diet this AM. Spoke with pt at bedside, who reports he has not yet tried full liquids yet. Noted cup of jello on tray table that was 75% eaten. Pt reports good tolerance of clear liquids- estimates he is consuming 75% of meal trays. He denies any nausea, vomiting, or abdominal pain with eating.   Pt amenable to try supplements- reviewed supplement offerings and pt would like to  try Ensure. Encouraged pt to consume supplements as well as food off meal trays to optimize nutritional status and healing.   Palliative care team following for goal of care; pt still desires full scope treatment, however, MD will be reviewing treatment plan with family today.   Labs reviewed: CBGS: 134-138., K: 5.5, Mg: 2.6.   Diet Order:  Diet full liquid Room service appropriate? Yes; Fluid consistency: Thin  Skin:  Wound (see comment) (neck, chest, abdomen incision)  Last BM:  03/09/16  Height:   Ht Readings from Last 1 Encounters:  03/06/16 6\' 3"  (1.905 m)    Weight:   Wt Readings from Last 1 Encounters:  03/06/16 230 lb (104.3 kg)    Ideal Body Weight:  89 kg  BMI:  Body mass index is 28.75 kg/m.  Estimated Nutritional Needs:   Kcal:  2400-2700kcal/day   Protein:  125-146g/day   Fluid:  >2.4L/day   EDUCATION NEEDS:   No education needs identified at this time  Rilynn Habel A. Jimmye Norman, RD, LDN, CDE Pager: [phone] After hours Pager: [phone]

## 2016-03-11 NOTE — Progress Notes (Signed)
Daily Progress Note   Patient Name: Matthew Waller       Date: 03/11/2016 DOB: November 02, 1945  Age: 71 y.o. MRN#: [id number] Attending Physician: Shon Millet* Primary Care Physician: [name], MD Admit Date: 03/06/2016  Reason for Consultation/Follow-up: Non pain symptom management  Subjective: Matthew Waller is doing well today. Continues to report bowel movements and had clears today.  Does report some nausea and abdominal discomfort following this but "may have overdone it."  No vomiting and currently feeling well.  Length of Stay: 5  Current Medications: Scheduled Meds:  . dexamethasone  2 mg Intravenous Once   And  . famotidine (PEPCID) IV  20 mg Intravenous Q12H  . enoxaparin (LOVENOX) injection  40 mg Subcutaneous Q24H  . lip balm  1 application Topical BID    Continuous Infusions:   PRN Meds: diphenhydrAMINE, glycopyrrolate, HYDROmorphone (DILAUDID) injection, LORazepam, magic mouthwash, menthol-cetylpyridinium, methocarbamol (ROBAXIN)  IV, [DISCONTINUED] ondansetron **OR** ondansetron (ZOFRAN) IV, phenol, prochlorperazine  Physical Exam         Constitutional: He is oriented to person, place, and time. He appears well-developed and well-nourished. No distress.  HENT:  Head: Normocephalic and atraumatic.  Mouth/Throat: Mucous membranes are moist. No oropharyngeal exudate.  Eyes: EOM are normal.  Neck: Normal range of motion.  Abdominal: He exhibits distension and ascites. Bowel sounds are decreased. There is generalized tenderness Musculoskeletal: Normal range of motion.  Neurological: He is alert and oriented to person, place, and time.  Skin: Skin is warm and dry. There is pallor.  Psychiatric: He has a normal mood and affect. His behavior is normal. Judgment and thought content normal.   Vital Signs: BP  (!) 143/92 (BP Location: Right Arm)   Pulse 81   Temp 97.7 F (36.5 C) (Oral)   Resp 18   Ht 6\' 3"  (1.905 m)   Wt 104.3 kg (230 lb)   SpO2 94%   BMI 28.75 kg/m  SpO2: SpO2: 94 % O2 Device: O2 Device: Not Delivered O2 Flow Rate:    Intake/output summary:   Intake/Output Summary (Last 24 hours) at 03/11/16 1028 Last data filed at 03/11/16 0906  Gross per 24 hour  Intake              120 ml  Output                0 ml  Net              120 ml   LBM: Last BM Date: 03/08/16; pt reported BM early this morning! Baseline Weight: Weight: 104.3 kg (230 lb) Most recent weight: Weight: 104.3 kg (230 lb)  Flowsheet Rows   Flowsheet Row Most Recent Value  Intake Tab  Referral Department  Surgery  Unit at Time of Referral  Med/Surg Unit  Palliative Care Primary Diagnosis  Cancer  Date Notified  03/06/16  Palliative Care Type  New Palliative care  Reason for referral  Clarify Goals of Care  Date of Admission  03/06/16  Date first seen by Palliative Care  03/07/16  # of days Palliative referral response time  1 Day(s)  # of days IP prior to Palliative referral  0  Clinical Assessment  Psychosocial & Spiritual Assessment  Palliative Care Outcomes      Patient Active Problem List   Diagnosis Date Noted  . Protein-calorie malnutrition, severe 03/07/2016  . Palliative care by specialist   . 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 Small bowel obstruction in setting of carcinomatosis 03/06/2016  . SBO (small bowel obstruction) 03/06/2016  . Goals of care, counseling/discussion 03/06/2016  . Deviated nasal septum 03/06/2016  . Malignant ascites 03/01/2016  . 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 Abdominal pain 02/26/2016  . Transaminitis 02/26/2016  . Dysuria 02/26/2016  . Blepharitis of both eyes 02/12/2012  . Droopy eyelid 02/12/2012  . Bilateral dry eyes 02/12/2012    Palliative Care  Assessment & Plan   HPI: 71 y.o. 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 He subsequently had an ERCP with biliary stent placed d/t stricture in CBD, pleurx placed for management of his recurrent ascites, and PAC placed for planned palliative systemic chemotherapy. PET scan still pending. He then presented to ED with nausea and vomiting for 24 hours and was admitted on 03/06/2016 after CT abd showed likely small bowel obstruction. 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 Palliative consult for goals of care conversation.  Recommendations/Plan:  Full code with full scope interventions  Continues to improve and tolerating clears.  Will continue to taper steroids today.   Goals of Care and Additional Recommendations:  Limitations on Scope of Treatment: Full Scope Treatment  Code Status:  Full code  Prognosis:   Unable to determine  Discharge Planning:  Home with [place] was discussed with pt and wife.  Thank you for allowing the Palliative Medicine Team to assist in the care of this patient.  Total time: 15 minutes    Greater than 50%  of this time was spent counseling and coordinating care related to the above assessment and plan.  Matthew Rough, MD [place] Team [phone]

## 2016-03-11 NOTE — Care Management Important Message (Signed)
Important Message  Patient Details  Name: [name] MRN: [id number] Date of Birth: Jun 26, 1945   Medicare Important Message Given:  Yes    Kerin Salen 03/11/2016, 1:04 [place] Message  Patient Details  Name: [name] MRN: [id number] Date of Birth: Feb 22, 1945   Medicare Important Message Given:  Yes    Kerin Salen 03/11/2016, 1:04 PM

## 2016-03-11 NOTE — Progress Notes (Signed)
Matthew Waller   DOB:1945/11/21   [id number]   [id number]  Oncology follow-up note   Subjective: Pt had NG tube placed by radiology again on 2/2 but was was incidentally pulled out by himself again during his sleep. He has been overall doing better over the weekend, had a large pulmonary yesterday, and a small one this morning, no significant nausea, abdominal pain has improved some, he is tolerating clear liquid by mouth well.  Objective:  Vitals:   03/11/16 0429 03/11/16 1447  BP: (!) 143/92 133/89  Pulse: 81 87  Resp: 18 20  Temp: 97.7 F (36.5 C) 99.1 F (37.3 C)    Body mass index is 28.75 kg/m.  Intake/Output Summary (Last 24 hours) at 03/11/16 1742 Last data filed at 03/11/16 1500  Gross per 24 hour  Intake              410 ml  Output                0 ml  Net              410 ml     Sclerae unicteric  Oropharynx clear  No peripheral adenopathy  Lungs clear -- no rales or rhonchi  Heart regular rate and rhythm  Abdomen Distended, Pleurx is covered   MSK no focal spinal tenderness, no peripheral edema  Neuro nonfocal   CBG (last 3)   Recent Labs  03/10/16 1601 03/10/16 2110 03/11/16 1439  GLUCAP 138* 134* 140*     Labs:  Lab Results  Component Value Date   WBC 11.2 (H) 03/11/2016   HGB 13.4 03/11/2016   HCT 40.1 03/11/2016   MCV 98.8 03/11/2016   PLT 197 03/11/2016   NEUTROABS 9.2 (H) 03/11/2016   CMP Latest Ref Rng & Units 03/11/2016 03/10/2016 03/09/2016  Glucose 65 - 99 mg/dL 127(H) 108(H) 120(H)  BUN 6 - 20 mg/dL 39(H) 43(H) 45(H)  Creatinine 0.61 - 1.24 mg/dL 1.25(H) 1.36(H) 1.49(H)  Sodium 135 - 145 mmol/L 140 141 144  Potassium 3.5 - 5.1 mmol/L 5.5(H) 4.3 4.8  Chloride 101 - 111 mmol/L 105 105 107  CO2 22 - 32 mmol/L 26 28 29   Calcium 8.9 - 10.3 mg/dL 7.9(L) 7.9(L) 8.0(L)  Total Protein 6.5 - 8.1 g/dL - - -  Total Bilirubin 0.3 - 1.2 mg/dL - - -  Alkaline Phos 38 - 126 U/L - - -  AST 15 - 41 U/L - - -  ALT 17 - 63 U/L - - -     Urine Studies No results for input(s): UHGB, CRYS in the last 72 hours.  Invalid input(s): UACOL, UAPR, USPG, UPH, UTP, UGL, [place], UBIL, UNIT, UROB, [place], UEPI, UWBC, [place], [place], [place]  Basic Metabolic Panel:  Recent Labs Lab 03/07/16 0537 03/08/16 0522 03/09/16 0658 03/10/16 0626 03/11/16 0516  NA 135 138 144 141 140  K 4.6 4.5 4.8 4.3 5.5*  CL 92* 99* 107 105 105  CO2 31 29 29 28 26   GLUCOSE 135* 136* 120* 108* 127*  BUN 38* 45* 45* 43* 39*  CREATININE 1.40* 1.39* 1.49* 1.36* 1.25*  CALCIUM 8.1* 8.0* 8.0* 7.9* 7.9*  MG  --   --   --   --  2.6*  PHOS  --   --   --   --  4.5   GFR Estimated Creatinine Clearance: 68.8 mL/min (by C-G formula based on SCr of 1.25 mg/dL (H)). Liver Function Tests:  Recent Labs  Lab 03/06/16 1730  AST 45*  ALT 36  ALKPHOS 107  BILITOT 0.8  PROT 6.7  ALBUMIN 2.3*    Recent Labs Lab 03/06/16 1730  LIPASE 21   No results for input(s): AMMONIA in the last 168 hours. Coagulation profile  Recent Labs Lab 03/06/16 0922  INR 1.26    CBC:  Recent Labs Lab 03/06/16 0922  03/07/16 0537 03/08/16 0522 03/09/16 0658 03/10/16 0626 03/11/16 0516  WBC 12.9*  < > 12.4* 11.9* 11.8* 12.1* 11.2*  NEUTROABS 10.3*  --   --  9.9* 9.6* 9.7* 9.2*  HGB 14.3  < > 13.5 13.5 13.4 13.7 13.4  HCT 41.4  < > 39.3 40.6 40.4 41.6 40.1  MCV 98.6  < > 98.5 100.2* 102.0* 100.7* 98.8  PLT 429*  < > 330 256 225 221 197  < > = values in this interval not displayed. Cardiac Enzymes: No results for input(s): CKTOTAL, CKMB, CKMBINDEX, TROPONINI in the last 168 hours. BNP: Invalid input(s): POCBNP CBG:  Recent Labs Lab 03/09/16 1602 03/09/16 2107 03/10/16 1601 03/10/16 2110 03/11/16 1439  GLUCAP 119* 108* 138* 134* 140*   D-Dimer No results for input(s): DDIMER in the last 72 hours. Hgb A1c No results for input(s): HGBA1C in the last 72 hours. Lipid Profile No results for input(s): CHOL, HDL, LDLCALC, TRIG, CHOLHDL, LDLDIRECT  in the last 72 hours. Thyroid function studies No results for input(s): TSH, T4TOTAL, T3FREE, THYROIDAB in the last 72 hours.  Invalid input(s): FREET3 Anemia work up No results for input(s): VITAMINB12, FOLATE, FERRITIN, TIBC, IRON, RETICCTPCT in the last 72 hours. Microbiology No results found for this or any previous visit (from the past 240 hour(s)).    Studies:  No results found.  Assessment: 71 y.o. 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  1. Small bowel obstruction secondary to peritoneal carcinomatosis, improving  2. 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 Obstructive jaundice, status post CBD stent placement, resolved 4. Anorexia and weight loss  5. Moderate calorie and protein malnutrition 6. AKI secondary to dehydration, improving   Plan:  -his SBO symptoms have much improved, he will advance his diet to full liquid today -I encourage his to start taking some nutritional supplement, such as Ensure or boost -continue pleurx drainage 2-3 times a week  -will follow up  Truitt Merle, MD 03/11/2016

## 2016-03-12 LAB — BASIC METABOLIC PANEL
Anion gap: 11 (ref 5–15)
BUN: 44 mg/dL — AB (ref 6–20)
CALCIUM: 8 mg/dL — AB (ref 8.9–10.3)
CO2: 25 mmol/L (ref 22–32)
CREATININE: 1.48 mg/dL — AB (ref 0.61–1.24)
Chloride: 103 mmol/L (ref 101–111)
GFR calc Af Amer: 52 mL/min — ABNORMAL LOW (ref >60)
GFR calc non Af Amer: 45 mL/min — ABNORMAL LOW (ref >60)
GLUCOSE: 139 mg/dL — AB (ref 65–99)
Potassium: 4.8 mmol/L (ref 3.5–5.1)
Sodium: 139 mmol/L (ref 135–145)

## 2016-03-12 LAB — CBC WITH DIFFERENTIAL/PLATELET
BASOS PCT: 0 %
Basophils Absolute: 0 10*3/uL (ref 0.0–0.1)
Eosinophils Absolute: 0 10*3/uL (ref 0.0–0.7)
Eosinophils Relative: 0 %
HCT: 39.4 % (ref 39.0–52.0)
HEMOGLOBIN: 13.1 g/dL (ref 13.0–17.0)
LYMPHS ABS: 1.1 10*3/uL (ref 0.7–4.0)
LYMPHS PCT: 11 %
MCH: 33 pg (ref 26.0–34.0)
MCHC: 33.2 g/dL (ref 30.0–36.0)
MCV: 99.2 fL (ref 78.0–100.0)
MONO ABS: 1.2 10*3/uL — AB (ref 0.1–1.0)
MONOS PCT: 12 %
Neutro Abs: 7.9 10*3/uL — ABNORMAL HIGH (ref 1.7–7.7)
Neutrophils Relative %: 77 %
Platelets: 190 10*3/uL (ref 150–400)
RBC: 3.97 MIL/uL — ABNORMAL LOW (ref 4.22–5.81)
RDW: 13.5 % (ref 11.5–15.5)
WBC: 10.2 10*3/uL (ref 4.0–10.5)

## 2016-03-12 LAB — GLUCOSE, CAPILLARY
GLUCOSE-CAPILLARY: 148 mg/dL — AB (ref 65–99)
GLUCOSE-CAPILLARY: 131 mg/dL — AB (ref 65–99)
Glucose-Capillary: 138 mg/dL — ABNORMAL HIGH (ref 65–99)
Glucose-Capillary: 145 mg/dL — ABNORMAL HIGH (ref 65–99)

## 2016-03-12 LAB — PHOSPHORUS: Phosphorus: 4.5 mg/dL (ref 2.5–4.6)

## 2016-03-12 LAB — MAGNESIUM: Magnesium: 2.5 mg/dL — ABNORMAL HIGH (ref 1.7–2.4)

## 2016-03-12 MED ORDER — ALUM & MAG HYDROXIDE-SIMETH 200-200-20 MG/5ML PO SUSP
30.0000 mL | ORAL | Status: DC | PRN
  Administered 2016-03-13 – 2016-03-14 (×4): 30 mL via ORAL
  Filled 2016-03-12 (×4): qty 30

## 2016-03-12 MED ORDER — ZOLPIDEM TARTRATE 5 MG PO TABS
5.0000 mg | ORAL_TABLET | Freq: Once | ORAL | Status: AC
  Administered 2016-03-12: 5 mg via ORAL
  Filled 2016-03-12: qty 1

## 2016-03-12 NOTE — Progress Notes (Signed)
Physical Therapy Treatment Patient Details Name: Matthew Waller MRN: [id number] DOB: 08/09/1945 Today's Date: 03/12/2016    History of Present Illness 71 yo male admitted with SBO in setting of carcinomatosis. Hx of met cholangiocarcinoma, pleurx cathether placement 1/31.     PT Comments    Pt initially declined participation. Wife strongly encouraged pt to participate and he agreed. Pt performed standing LE exercises x 15 reps. He tolerated activity well. He is agreeable to HHPT follow up. Encouraged pt and wife to continue ambulating several times/day as tolerated.    Follow Up Recommendations  Home health PT;Supervision - Intermittent     Equipment Recommendations  None recommended by PT    Recommendations for Other Services       Precautions / Restrictions Precautions Precautions: Fall Restrictions Weight Bearing Restrictions: No    Mobility  Bed Mobility               General bed mobility comments: in recliner. Wife reports that he has remembered to roll.   Transfers     Transfers: Sit to/from Stand Sit to Stand: Supervision         General transfer comment: for safety.   Ambulation/Gait             General Gait Details: Pt already walked x3 today   Stairs            Wheelchair Mobility    Modified Rankin (Stroke Patients Only)       Balance                                    Cognition Arousal/Alertness: Awake/alert Behavior During Therapy: WFL for tasks assessed/performed Overall Cognitive Status: Within Functional Limits for tasks assessed                      Exercises General Exercises - Lower Extremity Hip ABduction/ADduction: AROM;Both;15 reps;Standing Hip Flexion/Marching: AROM;Both;15 reps;Standing Heel Raises: AROM;Both;15 reps;Standing Mini-Sqauts: AROM;Both;15 reps;Standing Other Exercises Other Exercises: Knee flexion, active, both, 15 reps    General Comments        Pertinent  Vitals/Pain Pain Assessment: No/denies pain    Home Living                      Prior Function            PT Goals (current goals can now be found in the care plan section) Progress towards PT goals: Progressing toward goals    Frequency    Min 2X/week      PT Plan Current plan remains appropriate    Co-evaluation             End of Session   Activity Tolerance: Patient tolerated treatment well Patient left: in chair;with call bell/phone within reach;with family/visitor present     Time: 7353-2992 PT Time Calculation (min) (ACUTE ONLY): 8 min  Charges:  $Gait Training: 8-22 mins                    G Codes:      Weston Anna, MPT Pager: [phone]

## 2016-03-12 NOTE — Progress Notes (Signed)
PROGRESS NOTE    AUSITN MANY  [id number] DOB: 1945/12/07 DOA: 03/06/2016 PCP: [name], MD     Brief Narrative:  Matthew Waller is a 72 y.o. 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 Patient presented to the ER because of persistent nausea vomiting over the last 24 hours. Patient also has been having abdominal discomfort. Patient's last bowel movement was almost a week ago. CT scan of the abdomen shows features concerning for small bowel obstruction with transition point in the distal ileum with possible peritoneal carcinomatosis. On-call general surgeon was consulted. Patient admitted for small bowel obstruction with known history of recent diagnosis of cholangiocarcinoma.  Patient underwent NGT placed by IR (difficulty placement with RN due to deviated septum). He accidentally removed this in his sleep. It was replaced by IR , but again accidentally removed by patient. There was also discussion of placing a palliative G-tube vs replacing NGT, but this was not done as his symptoms slowly started to improve. He has been slowly progressing with diet and currently on full liquid diet. General surgery does not feel he should progress more than this currently. He is at risk of re-developing SBO due to his cancer. Oncology is following as well, planning for palliative chemo once acute illness resolved. Palliative has been involved.    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 Clinically improving, having loose BM and tolerating FLD. Had small amount of emesis this morning, but currently feeling well.    Hyperkalemia -Mild, EKG without acute ST-T changes -Resolved   CKD stage 3 -Stable   Severe malnutrition in context of acute illness/injury  -Appreciate RD    DVT prophylaxis: lovenox - will hold due to platelet count from 400s --> 190, without evidence of bleed. Pharmacy to follow up and restart lovenox with platelet stabilization  Code Status: Full Family Communication: spoke with wife over the phone during examination  Disposition Plan: pending further improvement, stabilization   Consultants:   Oncology  General Surgery  Palliative care   Procedures:   None  Antimicrobials:   None     Subjective: No issues overnight. States he did not feel too well yesterday morning, but it improved throughout the course of the day. This morning, had a small amount of emesis but currently feeling well without nausea, abdominal pain. Had 2 BM yesterday.    Objective: Vitals:   03/11/16 0429 03/11/16 1447 03/11/16 2058 03/12/16 0500  BP: (!) 143/92 133/89 (!) 145/95 118/82  Pulse: 81 87 84 92  Resp: 18 20 18 18   Temp: 97.7 F (36.5 C) 99.1 F (37.3 C) 99 F (37.2 C) 97.6 F (36.4 C)  TempSrc: Oral  Axillary Oral Oral  SpO2: 94% 96% 97% 95%  Weight:      Height:        Intake/Output Summary (Last 24 hours) at 03/12/16 1120 Last data filed at 03/12/16 0845  Gross per 24 hour  Intake              410 ml  Output                0 ml  Net               410 ml   Filed Weights   03/06/16 2206  Weight: 104.3 kg (230 lb)    Examination:  General exam: Appears calm, comfortable   Respiratory system: Clear to auscultation. Respiratory effort normal. Cardiovascular system: S1 & S2 heard, RRR. No JVD, murmurs, rubs, gallops or clicks. No pedal edema. Gastrointestinal system: Abdomen is distended but soft, not TTP, +PleurX in place   Central nervous system: Alert and oriented. No focal neurological deficits. Extremities: Symmetric 5 x 5 power. Skin: No rashes, lesions or ulcers Psychiatry: Judgement and insight appear normal. Mood & affect appropriate.   Data Reviewed: I have personally reviewed following labs and imaging studies  CBC:  Recent Labs Lab 03/08/16 0522 03/09/16 0658 03/10/16 0626 03/11/16 0516 03/12/16 0607  WBC 11.9* 11.8* 12.1* 11.2* 10.2  NEUTROABS 9.9* 9.6* 9.7* 9.2* 7.9*  HGB 13.5 13.4 13.7 13.4 13.1  HCT 40.6 40.4 41.6 40.1 39.4  MCV 100.2* 102.0* 100.7* 98.8 99.2  PLT 256 225 221 197 [id number]   Basic Metabolic Panel:  Recent Labs Lab 03/08/16 0522 03/09/16 0658 03/10/16 0626 03/11/16 0516 03/12/16 0607  NA 138 144 141 140 139  K 4.5 4.8 4.3 5.5* 4.8  CL 99* 107 105 105 103  CO2 29 29 28 26 25   GLUCOSE 136* 120* 108* 127* 139*  BUN 45* 45* 43* 39* 44*  CREATININE 1.39* 1.49* 1.36* 1.25* 1.48*  CALCIUM 8.0* 8.0* 7.9* 7.9* 8.0*  MG  --   --   --  2.6* 2.5*  PHOS  --   --   --  4.5 4.5   GFR: Estimated Creatinine Clearance: 58.1 mL/min (by C-G formula based on SCr of 1.48 mg/dL (H)). Liver Function Tests:  Recent Labs Lab 03/06/16 1730  AST 45*  ALT 36  ALKPHOS 107  BILITOT 0.8  PROT 6.7  ALBUMIN 2.3*    Recent Labs Lab 03/06/16 1730  LIPASE 21   No results for input(s): AMMONIA in the last 168 hours. Coagulation Profile:  Recent Labs Lab 03/06/16 0922  INR 1.26   Cardiac Enzymes: No results for input(s): CKTOTAL, CKMB, CKMBINDEX, TROPONINI in the last 168 hours. BNP (last 3  results) No results for input(s): PROBNP in the last 8760 hours. HbA1C: No results for input(s): HGBA1C in the last 72 hours. CBG:  Recent Labs Lab 03/10/16 2110 03/11/16 1439 03/11/16 2358 03/12/16 0538 03/12/16 0758  GLUCAP 134* 140* 135* 138* 145*   Lipid Profile: No results for input(s): CHOL, HDL, LDLCALC, TRIG, CHOLHDL, LDLDIRECT in the last 72 hours. Thyroid Function Tests: No results for input(s): TSH, T4TOTAL, FREET4, T3FREE, THYROIDAB in the last 72 hours. Anemia Panel: No results for input(s): VITAMINB12, FOLATE, FERRITIN, TIBC, IRON, RETICCTPCT in the last 72 hours. Sepsis Labs:  Recent Labs Lab 03/06/16 1757  LATICACIDVEN 1.60    No results found for this or any previous visit (from the past 240 hour(s)).     Radiology Studies: No results found.  Scheduled Meds: . famotidine  20 mg Oral BID  . feeding supplement (ENSURE ENLIVE)  237 mL Oral TID BM  . lip balm  1 application Topical BID   Continuous Infusions:    LOS: 6 days    Time spent: 30 minutes   Dessa Phi, DO [facility] [email or web address] Password [email or web address] 03/12/2016, 11:20 AM

## 2016-03-13 DIAGNOSIS — N183 Chronic kidney disease, stage 3 (moderate): Secondary | ICD-10-CM

## 2016-03-13 DIAGNOSIS — E875 Hyperkalemia: Secondary | ICD-10-CM

## 2016-03-13 DIAGNOSIS — E43 Unspecified severe protein-calorie malnutrition: Secondary | ICD-10-CM

## 2016-03-13 DIAGNOSIS — C801 Malignant (primary) neoplasm, unspecified: Secondary | ICD-10-CM

## 2016-03-13 LAB — BASIC METABOLIC PANEL
ANION GAP: 11 (ref 5–15)
BUN: 49 mg/dL — ABNORMAL HIGH (ref 6–20)
CALCIUM: 7.9 mg/dL — AB (ref 8.9–10.3)
CHLORIDE: 100 mmol/L — AB (ref 101–111)
CO2: 26 mmol/L (ref 22–32)
CREATININE: 1.65 mg/dL — AB (ref 0.61–1.24)
GFR calc non Af Amer: 40 mL/min — ABNORMAL LOW (ref >60)
GFR, EST AFRICAN AMERICAN: 46 mL/min — AB (ref >60)
Glucose, Bld: 142 mg/dL — ABNORMAL HIGH (ref 65–99)
Potassium: 4.4 mmol/L (ref 3.5–5.1)
SODIUM: 137 mmol/L (ref 135–145)

## 2016-03-13 LAB — CBC WITH DIFFERENTIAL/PLATELET
BASOS ABS: 0 10*3/uL (ref 0.0–0.1)
BASOS PCT: 0 %
EOS ABS: 0 10*3/uL (ref 0.0–0.7)
Eosinophils Relative: 0 %
HCT: 39.1 % (ref 39.0–52.0)
HEMOGLOBIN: 13.3 g/dL (ref 13.0–17.0)
Lymphocytes Relative: 8 %
Lymphs Abs: 0.7 10*3/uL (ref 0.7–4.0)
MCH: 33.1 pg (ref 26.0–34.0)
MCHC: 34 g/dL (ref 30.0–36.0)
MCV: 97.3 fL (ref 78.0–100.0)
MONOS PCT: 13 %
Monocytes Absolute: 1.3 10*3/uL — ABNORMAL HIGH (ref 0.1–1.0)
NEUTROS PCT: 79 %
Neutro Abs: 7.7 10*3/uL (ref 1.7–7.7)
Platelets: 179 10*3/uL (ref 150–400)
RBC: 4.02 MIL/uL — AB (ref 4.22–5.81)
RDW: 13.5 % (ref 11.5–15.5)
WBC: 9.7 10*3/uL (ref 4.0–10.5)

## 2016-03-13 LAB — GLUCOSE, CAPILLARY
GLUCOSE-CAPILLARY: 145 mg/dL — AB (ref 65–99)
GLUCOSE-CAPILLARY: 124 mg/dL — AB (ref 65–99)
GLUCOSE-CAPILLARY: 146 mg/dL — AB (ref 65–99)

## 2016-03-13 MED ORDER — METOCLOPRAMIDE HCL 5 MG PO TABS
5.0000 mg | ORAL_TABLET | Freq: Three times a day (TID) | ORAL | Status: DC
  Administered 2016-03-14: 5 mg via ORAL
  Filled 2016-03-13: qty 1

## 2016-03-13 MED ORDER — SODIUM CHLORIDE 0.9 % IV SOLN
12.5000 mg | Freq: Three times a day (TID) | INTRAVENOUS | Status: DC | PRN
  Filled 2016-03-13: qty 0.5

## 2016-03-13 NOTE — Progress Notes (Signed)
03/13/16 0430  What Happened  Was fall witnessed? No  Was patient injured? Unsure  Patient found in bathroom  Found by No one-pt stated they fell  Stated prior activity ambulating-unassisted  Follow Up  MD notified Schorr  Time MD notified [phone]  Family notified No- patient refusal  Additional tests No  Simple treatment Other (comment) (foam applied to knee)  Progress note created (see row info) Yes  Adult Fall Risk Assessment  Risk Factor Category (scoring not indicated) High fall risk per protocol (document High fall risk)  Age 71  Fall History: Fall within 6 months prior to admission 5  Elimination; Bowel and/or Urine Incontinence 0  Elimination; Bowel and/or Urine Urgency/Frequency 0  Medications: includes PCA/Opiates, Anti-convulsants, Anti-hypertensives, Diuretics, Hypnotics, Laxatives, Sedatives, and Psychotropics 5  Patient Care Equipment 1  Mobility-Assistance 2  Mobility-Gait 2  Mobility-Sensory Deficit 0  Altered awareness of immediate physical environment 0  Impulsiveness 0  Lack of understanding of one's physical/cognitive limitations 0  Total Score 17  Patient's Fall Risk High Fall Risk (>13 points)  Adult Fall Risk Interventions  Required Bundle Interventions *See Row Information* High fall risk - low, moderate, and high requirements implemented  Additional Interventions Use of appropriate toileting equipment (bedpan, BSC, etc.)  Screening for Fall Injury Risk  Risk For Fall Injury- See Row Information  Nurse judgement  Injury Prevention Interventions Family supervision;Safety Sitter/Safety Rounder  Vitals  Temp 97.2 F (36.2 C)  Temp Source Axillary  BP (!) 143/88  BP Location Right Arm  BP Method Automatic  Patient Position (if appropriate) Lying  Pulse Rate 90  Pulse Rate Source Dinamap  Resp 20  Oxygen Therapy  SpO2 97 %  O2 Device Room Air  Pain Assessment  Pain Assessment No/denies pain  Pain Score 0  PCA/Epidural/Spinal Assessment   Respiratory Pattern Regular;Unlabored  Neurological  Neuro (WDL) X  Level of Consciousness Alert  Orientation Level Oriented to person;Oriented to place;Oriented to situation;Disoriented to time  Motor Function/Sensation Assessment Grip;Elbow extension;Elbow flexion;Dorsiflexion;Plantar flexion  R Hand Grip Present;Strong  L Hand Grip Present;Strong  R Elbow Extension (Push/Biceps) Present;Strong  L Elbow Extension (Push/Biceps) Present;Strong  R Elbow Flexion (Pull/Triceps) Present;Strong  L Elbow Flexion (Pull/Triceps) Present;Strong  R Foot Dorsiflexion Present;Strong  L Foot Dorsiflexion Present;Strong  R Foot Plantar Flexion Present;Strong  L Foot Plantar Flexion Present;Strong  Neuro Symptoms Forgetful  Neuro Additional Assessments No  Musculoskeletal  Musculoskeletal (WDL) X (unsteady)  Assistive Device None  Generalized Weakness Yes  Weight Bearing Restrictions No  Integumentary  Integumentary (WDL) X  Skin Color Appropriate for ethnicity  Skin Condition Dry  Skin Integrity Catheter entry/exit site;Surgical Incision (see LDA);Other (Comment);Abrasion  Abrasion Location Knee  Abrasion Location Orientation Right  Abrasion Intervention Foam  Catheter Entry/Exit Location Abdomen  Catheter Entry/Exit Location Orientation Right;Upper  Catheter Entry/Exit Intervention Gauze  Skin Turgor Non-tenting

## 2016-03-13 NOTE — Progress Notes (Signed)
Patient requested to ambulate in the hallway. Due to unsteady gait, used rolling walker and gait belt to ambulate. Patient reports feeling safe and comfortable using the walker.

## 2016-03-13 NOTE — Progress Notes (Signed)
Nutrition Follow-up  DOCUMENTATION CODES:   Severe malnutrition in context of acute illness/injury  INTERVENTION:  Continue Ensure Enlive po TID, each supplement provides 350 kcal and 20 grams of protein. Encouraged patient to try to consume all 3 daily to help meet calorie and protein needs.  RD to order 48 hour calorie count per MD consult (Dr. Maylene Roes). Instructions for calorie count will be in comments on order. Please document percent consumed of each item on meal tray and Ensure Enlive.  As patient remains full code and full scope treatment, if patient unable to meet calorie and protein needs through PO intake, recommend discussion regarding short-term or long-term placement of access for tube feeding.  NUTRITION DIAGNOSIS:   Malnutrition related to cancer and cancer related treatments, acute illness, poor appetite, vomiting, nausea as evidenced by moderate depletions of muscle mass, mild depletion of body fat, energy intake < or equal to 50% for > or equal to 5 days.  Ongoing.  GOAL:   Patient will meet greater than or equal to 90% of their needs  Not met.   MONITOR:   PO intake, Supplement acceptance, Diet advancement, Labs, Weight trends, Skin, I & O's  REASON FOR ASSESSMENT:   Malnutrition Screening Tool    ASSESSMENT:   71 y.o. 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 Patient presented to the ER because of persistent nausea vomiting over the last 24 hours. Patient also has been having abdominal discomfort. Patient's last bowel movement was almost a week ago. CT scan of the abdomen shows features concerning for small bowel obstruction with transition point in the distal ileum with possible peritoneal carcinomatosis. On-call general surgeon Dr. [name] was consulted. Patient is being admitted for small bowel obstruction with known  history of recent diagnosis of cholangiocarcinoma.  -Per chart General Surgery does not want to advance patient's diet beyond full liquid due to risk of re-developing SBO in setting of cancer. Plan per Oncology is for patient to receive palliative chemotherapy once acute illness is resolved. Per Palliative Medicine patient remains full code with full scope interventions.  Patient currently being followed by RD in setting of malnutrition. RD received new consults for assessment, calorie count, and to discuss oral nutrition supplements with patient. Discussed with RN after assessing patient - RD will place order to initiate 48 hour calorie count.  Spoke with patient and wife at bedside. Patient reports his appetite remains poor. He is experiencing nausea and reflux. Patient reports he had two episodes of green emesis (one last night and one this morning). He reports the emesis is typically delayed several hours after eating. Patient also inquiring about an oral nutrition supplement his daughter sells called [employer] Meal Replacement Shake (210 kcal and 24 grams of protein). This supplement is lower in calories, carbohydrates, and fat than Ensure Enlive. Discussed that if patient would like to mix it with milk and ice cream to make a milkshake it would be a good option at home in addition to Ensure or Boost.  Meal Completion: 25-95% per chart. Patient reports he had 1 Ensure Enlive yesterday. For dinner last night he had cream of chicken soup, hot chocolate, and chocolate ice cream. For breakfast this morning he had hot chocolate, 1/2 carton of milk, and 1/2 cup of orange juice. In the past 24 hours patient has had approximately 900 calories (38% minimum estimated calorie needs) and 36 grams  of protein (29% minimum estimated protein needs).   Medications reviewed and include: famotidine, Zofran 4 mg Q6hrs PRN. Patient is also ordered for Reglan and Compazine PRN, but no documented use per chart.  Labs  reviewed: CBG 131-148 past 24 hrs, Chloride 100, BUN 49, Creatinine 1.65.   No weight since admission to trend.   Diet Order:  Diet full liquid Room service appropriate? Yes; Fluid consistency: Thin  Skin:  Wound (see comment) (neck, chest, abdomen incision)  Last BM:  03/09/16  Height:   Ht Readings from Last 1 Encounters:  03/06/16 6' 3"  (1.905 m)    Weight:   Wt Readings from Last 1 Encounters:  03/06/16 230 lb (104.3 kg)    Ideal Body Weight:  89 kg  BMI:  Body mass index is 28.75 kg/m.  Estimated Nutritional Needs:   Kcal:  2400-2700kcal/day   Protein:  125-146g/day   Fluid:  >2.4L/day   EDUCATION NEEDS:   No education needs identified at this time  Willey Blade, MS, RD, LDN Pager: [phone] After Hours Pager: [phone]

## 2016-03-13 NOTE — Progress Notes (Signed)
PROGRESS NOTE    Matthew Waller  [id number] DOB: 08/03/1945 DOA: 03/06/2016 PCP: [name], MD     Brief Narrative:  Matthew Waller is a 71 y.o. 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 Patient presented to the ER because of persistent nausea vomiting over the last 24 hours. Patient also has been having abdominal discomfort. Patient's last bowel movement was almost a week ago. CT scan of the abdomen shows features concerning for small bowel obstruction with transition point in the distal ileum with possible peritoneal carcinomatosis. On-call general surgeon was consulted. Patient admitted for small bowel obstruction with known history of recent diagnosis of cholangiocarcinoma.  Patient underwent NGT placed by IR (difficulty placement with RN due to deviated septum). He accidentally removed this in his sleep. It was replaced by IR , but again accidentally removed by patient. There was also discussion of placing a palliative G-tube vs replacing NGT, but this was not done as his symptoms slowly started to improve. He has been slowly progressing with diet and currently on full liquid diet. General surgery does not feel he should progress more than this currently. He is at risk of re-developing SBO due to his cancer. Oncology is following as well, planning for palliative chemo once acute illness resolved. Palliative has been involved.   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 Will continue full scope of treatment  -PleurX accessed on 2/2 with 1L peritoneal fluid removed, and again done on 2/6 with anothet L removed -planning drainage again tomorrow (probably 500-800 Ml) and follow after that symptoms with presumed drainage 2-3 times a week (family will like M-W-F if possible, as previously instructed by IR). -Patient with ongoing nausea/vomiting today, even moving his bowels -will start reglan TID -continue PRN antiemetics -will check Abd x-ray   Hyperkalemia -Mild, EKG without any acute ST-T changes -Resolved  -will monitor electrolytes   CKD stage 3 -Stable overall -will monitor Cr trend -patient not drinking a lot currently and losing fluids with paracentesis (intermittently)  Severe malnutrition in context of acute illness/injury  -Appreciate RD  -will continue feeding supplementation    DVT prophylaxis: lovenox  Code Status: Full Family Communication: spoke with wife at bedside Disposition Plan: pending further improvement, PO tolerance and ability to maintain adequate nutrition. Patient still with active nausea and vomiting.   Consultants:   Oncology  General Surgery  Palliative care   Procedures:   None  Antimicrobials:   None    Subjective: Patient fall in the bathroom overnight. Reports having nausea and active vomiting   Objective: Vitals:   03/12/16 1410 03/12/16 2102 03/13/16 0430 03/13/16 1523  BP: 132/76 124/73 (!) 143/88 122/83  Pulse: 89 76 90 98  Resp: 20 20 20 20   Temp: 97.6 F (36.4 C) 98.5 F (36.9 C) 97.2 F (36.2 C)  98.2 F (36.8 C)  TempSrc: Oral Axillary Axillary Axillary  SpO2: 97% 97% 97% 97%  Weight:      Height:        Intake/Output Summary (Last 24 hours)  at 03/13/16 1835 Last data filed at 03/13/16 1646  Gross per 24 hour  Intake              857 ml  Output                0 ml  Net              857 ml   Filed Weights   03/06/16 2206  Weight: 104.3 kg (230 lb)    Examination:  General exam: Afebrile, reports feeling weak and listless. Patient with active nausea and vomiting. Had BM overnight. Also experienced mechanical fall in the bathroom while attempting to use toilet by himself (he slightly bruised right knee) Respiratory system: Clear to auscultation. Respiratory effort normal. Cardiovascular system: S1 & S2 heard, RRR. No JVD, murmurs, rubs, gallops or clicks. No pedal edema. Gastrointestinal system: Abdomen is distended, firm and with positive fluid wave, not TTP, +PleurX in place, reduce Bowel sound  Central nervous system: Alert and oriented. No focal neurological deficits. Extremities: Symmetric 4-5 x 5 power due to poor effort. Skin: No rashes, lesions or ulcers Psychiatry: Judgement and insight appear normal. Mood & affect appropriate.   Data Reviewed: I have personally reviewed following labs and imaging studies  CBC:  Recent Labs Lab 03/09/16 0658 03/10/16 0626 03/11/16 0516 03/12/16 0607 03/13/16 0534  WBC 11.8* 12.1* 11.2* 10.2 9.7  NEUTROABS 9.6* 9.7* 9.2* 7.9* 7.7  HGB 13.4 13.7 13.4 13.1 13.3  HCT 40.4 41.6 40.1 39.4 39.1  MCV 102.0* 100.7* 98.8 99.2 97.3  PLT 225 221 197 190 0000000   Basic Metabolic Panel:  Recent Labs Lab 03/09/16 0658 03/10/16 0626 03/11/16 0516 03/12/16 0607 03/13/16 0534  NA 144 141 140 139 137  K 4.8 4.3 5.5* 4.8 4.4  CL 107 105 105 103 100*  CO2 29 28 26 25 26   GLUCOSE 120* 108* 127* 139* 142*  BUN 45* 43* 39* 44* 49*  CREATININE 1.49* 1.36* 1.25* 1.48* 1.65*  CALCIUM 8.0* 7.9* 7.9* 8.0* 7.9*  MG  --   --  2.6* 2.5*  --   PHOS  --   --  4.5 4.5  --    GFR: Estimated Creatinine Clearance: 52.1 mL/min (by C-G formula based on SCr of 1.65 mg/dL (H)).  CBG:  Recent  Labs Lab 03/12/16 0758 03/12/16 1656 03/12/16 2108 03/13/16 0907 03/13/16 1557  GLUCAP 145* 148* 131* 145* 124*    No results found for this or any previous visit (from the past 240 hour(s)).     Radiology Studies: No results found.    Scheduled Meds: . famotidine  20 mg Oral BID  . feeding supplement (ENSURE ENLIVE)  237 mL Oral TID BM  . [START ON 03/14/2016] metoCLOPramide  5 mg Oral TID AC   Continuous Infusions:    LOS: 7 days    Time spent: 25 minutes   Barton Dubois, MD [phone] [facility] [email or web address] Password [facility] 03/13/2016, 6:35 PM

## 2016-03-13 NOTE — Progress Notes (Signed)
Matthew Waller   DOB:06-Aug-1945   [id number]   [id number]  Oncology follow-up note   Subjective: Pt has been able to tolerate liquid and some Ensure (one bottle a day), BM 1-2 times a day, still has intermittent nausea, and few episodes of vomiting. He accidentally felt in the bathroom today, mild bruise on the right knee. No other significant injury. Still feels weak.   Objective:  Vitals:   03/13/16 0430 03/13/16 1523  BP: (!) 143/88 122/83  Pulse: 90 98  Resp: 20 20  Temp: 97.2 F (36.2 C) 98.2 F (36.8 C)    Body mass index is 28.75 kg/m.  Intake/Output Summary (Last 24 hours) at 03/13/16 2026 Last data filed at 03/13/16 1646  Gross per 24 hour  Intake              857 ml  Output                0 ml  Net              857 ml     Sclerae unicteric  Oropharynx clear  No peripheral adenopathy  Lungs clear -- no rales or rhonchi  Heart regular rate and rhythm  Abdomen Distended, Pleurx is covered   MSK no focal spinal tenderness, no peripheral edema  Neuro nonfocal   CBG (last 3)   Recent Labs  03/12/16 2108 03/13/16 0907 03/13/16 1557  GLUCAP 131* 145* 124*     Labs:  Lab Results  Component Value Date   WBC 9.7 03/13/2016   HGB 13.3 03/13/2016   HCT 39.1 03/13/2016   MCV 97.3 03/13/2016   PLT 179 03/13/2016   NEUTROABS 7.7 03/13/2016   CMP Latest Ref Rng & Units 03/13/2016 03/12/2016 03/11/2016  Glucose 65 - 99 mg/dL 142(H) 139(H) 127(H)  BUN 6 - 20 mg/dL 49(H) 44(H) 39(H)  Creatinine 0.61 - 1.24 mg/dL 1.65(H) 1.48(H) 1.25(H)  Sodium 135 - 145 mmol/L 137 139 140  Potassium 3.5 - 5.1 mmol/L 4.4 4.8 5.5(H)  Chloride 101 - 111 mmol/L 100(L) 103 105  CO2 22 - 32 mmol/L 26 25 26   Calcium 8.9 - 10.3 mg/dL 7.9(L) 8.0(L) 7.9(L)  Total Protein 6.5 - 8.1 g/dL - - -  Total Bilirubin 0.3 - 1.2 mg/dL - - -  Alkaline Phos 38 - 126 U/L - - -  AST 15 - 41 U/L - - -  ALT 17 - 63 U/L - - -    Urine Studies No results for input(s): UHGB, CRYS in the last 72  hours.  Invalid input(s): UACOL, UAPR, USPG, UPH, UTP, UGL, [place], UBIL, UNIT, UROB, [place], UEPI, UWBC, Duwayne Heck [place], [place]  Basic Metabolic Panel:  Recent Labs Lab 03/09/16 [phone] 03/10/16 0626 03/11/16 0516 03/12/16 0607 03/13/16 0534  NA 144 141 140 139 137  K 4.8 4.3 5.5* 4.8 4.4  CL 107 105 105 103 100*  CO2 29 28 26 25 26   GLUCOSE 120* 108* 127* 139* 142*  BUN 45* 43* 39* 44* 49*  CREATININE 1.49* 1.36* 1.25* 1.48* 1.65*  CALCIUM 8.0* 7.9* 7.9* 8.0* 7.9*  MG  --   --  2.6* 2.5*  --   PHOS  --   --  4.5 4.5  --    GFR Estimated Creatinine Clearance: 52.1 mL/min (by C-G formula based on SCr of 1.65 mg/dL (H)). Liver Function Tests: No results for input(s): AST, ALT, ALKPHOS, BILITOT, PROT, ALBUMIN in the last 168 hours. No results for  input(s): LIPASE, AMYLASE in the last 168 hours. No results for input(s): AMMONIA in the last 168 hours. Coagulation profile No results for input(s): INR, PROTIME in the last 168 hours.  CBC:  Recent Labs Lab 03/09/16 0658 03/10/16 0626 03/11/16 0516 03/12/16 0607 03/13/16 0534  WBC 11.8* 12.1* 11.2* 10.2 9.7  NEUTROABS 9.6* 9.7* 9.2* 7.9* 7.7  HGB 13.4 13.7 13.4 13.1 13.3  HCT 40.4 41.6 40.1 39.4 39.1  MCV 102.0* 100.7* 98.8 99.2 97.3  PLT 225 221 197 190 179   Cardiac Enzymes: No results for input(s): CKTOTAL, CKMB, CKMBINDEX, TROPONINI in the last 168 hours. BNP: Invalid input(s): POCBNP CBG:  Recent Labs Lab 03/12/16 0758 03/12/16 1656 03/12/16 2108 03/13/16 0907 03/13/16 1557  GLUCAP 145* 148* 131* 145* 124*   D-Dimer No results for input(s): DDIMER in the last 72 hours. Hgb A1c No results for input(s): HGBA1C in the last 72 hours. Lipid Profile No results for input(s): CHOL, HDL, LDLCALC, TRIG, CHOLHDL, LDLDIRECT in the last 72 hours. Thyroid function studies No results for input(s): TSH, T4TOTAL, T3FREE, THYROIDAB in the last 72 hours.  Invalid input(s): FREET3 Anemia work up No results for  input(s): VITAMINB12, FOLATE, FERRITIN, TIBC, IRON, RETICCTPCT in the last 72 hours. Microbiology No results found for this or any previous visit (from the past 240 hour(s)).    Studies:  No results found.  Assessment: 71 y.o. 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  1. Small bowel obstruction secondary to peritoneal carcinomatosis, improving  2. 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 Obstructive jaundice, status post CBD stent placement, resolved 4. Anorexia and weight loss  5. Moderate calorie and protein malnutrition 6. AKI secondary to dehydration, improving   Plan:  -he still has intermittent nausea, has not been able to take much PO, suspicious for ongoing partial SBO  -I recommend to have a abd x-ray, or CT if needed, for further evaluation  -agree with reglan  -encourage pt to get out of bed and ambulate  -will talk to Dr. Wyonia Hough  -continue pleurx drainage 2-3 times a week  -will follow up  Truitt Merle, MD 03/13/2016

## 2016-03-14 ENCOUNTER — Encounter (HOSPITAL_COMMUNITY): Payer: Self-pay | Admitting: Interventional Radiology

## 2016-03-14 ENCOUNTER — Inpatient Hospital Stay (HOSPITAL_COMMUNITY): Payer: PPO

## 2016-03-14 HISTORY — PX: IR GENERIC HISTORICAL: IMG1180011

## 2016-03-14 LAB — BASIC METABOLIC PANEL
Anion gap: 11 (ref 5–15)
BUN: 57 mg/dL — ABNORMAL HIGH (ref 6–20)
CHLORIDE: 97 mmol/L — AB (ref 101–111)
CO2: 28 mmol/L (ref 22–32)
CREATININE: 1.7 mg/dL — AB (ref 0.61–1.24)
Calcium: 7.8 mg/dL — ABNORMAL LOW (ref 8.9–10.3)
GFR calc Af Amer: 44 mL/min — ABNORMAL LOW (ref >60)
GFR calc non Af Amer: 38 mL/min — ABNORMAL LOW (ref >60)
Glucose, Bld: 142 mg/dL — ABNORMAL HIGH (ref 65–99)
Potassium: 4.3 mmol/L (ref 3.5–5.1)
SODIUM: 136 mmol/L (ref 135–145)

## 2016-03-14 LAB — GLUCOSE, CAPILLARY
GLUCOSE-CAPILLARY: 147 mg/dL — AB (ref 65–99)
GLUCOSE-CAPILLARY: 127 mg/dL — AB (ref 65–99)
GLUCOSE-CAPILLARY: 114 mg/dL — AB (ref 65–99)

## 2016-03-14 MED ORDER — FENTANYL CITRATE (PF) 100 MCG/2ML IJ SOLN
INTRAMUSCULAR | Status: AC
  Filled 2016-03-14: qty 4

## 2016-03-14 MED ORDER — CEFAZOLIN SODIUM-DEXTROSE 2-4 GM/100ML-% IV SOLN
2.0000 g | INTRAVENOUS | Status: AC
  Administered 2016-03-14: 2 g via INTRAVENOUS
  Filled 2016-03-14: qty 100

## 2016-03-14 MED ORDER — MIDAZOLAM HCL 2 MG/2ML IJ SOLN
INTRAMUSCULAR | Status: AC | PRN
  Administered 2016-03-14 (×5): 1 mg via INTRAVENOUS

## 2016-03-14 MED ORDER — FAMOTIDINE IN NACL 20-0.9 MG/50ML-% IV SOLN
20.0000 mg | Freq: Two times a day (BID) | INTRAVENOUS | Status: DC
  Administered 2016-03-14 – 2016-03-16 (×4): 20 mg via INTRAVENOUS
  Filled 2016-03-14 (×5): qty 50

## 2016-03-14 MED ORDER — IOPAMIDOL (ISOVUE-300) INJECTION 61%
INTRAVENOUS | Status: AC
  Administered 2016-03-14: 10 mL
  Filled 2016-03-14: qty 50

## 2016-03-14 MED ORDER — GLUCAGON HCL RDNA (DIAGNOSTIC) 1 MG IJ SOLR
INTRAMUSCULAR | Status: AC
  Filled 2016-03-14: qty 1

## 2016-03-14 MED ORDER — LIDOCAINE HCL 1 % IJ SOLN
INTRAMUSCULAR | Status: AC
  Filled 2016-03-14: qty 20

## 2016-03-14 MED ORDER — IOPAMIDOL (ISOVUE-300) INJECTION 61%
10.0000 mL | Freq: Once | INTRAVENOUS | Status: AC | PRN
  Administered 2016-03-14: 10 mL

## 2016-03-14 MED ORDER — BOOST / RESOURCE BREEZE PO LIQD
1.0000 | Freq: Three times a day (TID) | ORAL | Status: DC
  Administered 2016-03-15 – 2016-03-16 (×2): 1 via ORAL

## 2016-03-14 MED ORDER — MIDAZOLAM HCL 2 MG/2ML IJ SOLN
INTRAMUSCULAR | Status: AC
  Filled 2016-03-14: qty 6

## 2016-03-14 MED ORDER — LIDOCAINE HCL 1 % IJ SOLN
INTRAMUSCULAR | Status: AC | PRN
  Administered 2016-03-14: 5 mL

## 2016-03-14 MED ORDER — FENTANYL CITRATE (PF) 100 MCG/2ML IJ SOLN
INTRAMUSCULAR | Status: AC | PRN
  Administered 2016-03-14: 25 ug via INTRAVENOUS
  Administered 2016-03-14: 50 ug via INTRAVENOUS
  Administered 2016-03-14: 25 ug via INTRAVENOUS

## 2016-03-14 MED ORDER — HYDROCODONE-ACETAMINOPHEN 5-325 MG PO TABS: 1.0000 | ORAL_TABLET | ORAL | Status: DC | PRN

## 2016-03-14 NOTE — Progress Notes (Signed)
MEDICATION-RELATED CONSULT NOTE   IR Procedure Consult - Anticoagulant/Antiplatelet PTA/Inpatient Med List Review by Pharmacist    Procedure: gastrostomy tube placement     Completed: 03/14/16  Post-Procedural bleeding risk per IR MD assessment:  standard  Patient's currently not on anticoagulant or antiplatelet medication.  Pharmacy will sign off.   Dia Sitter, PharmD, BCPS 03/14/2016 2:36 PM

## 2016-03-14 NOTE — Progress Notes (Signed)
Nutrition Follow-up  DOCUMENTATION CODES:   Severe malnutrition in context of acute illness/injury  INTERVENTION:   Boost Breeze po TID, each supplement provides 250 kcal and 9 grams of protein  Ensure Enlive po BID, each supplement provides 350 kcal and 20 grams of protein  If family wishes to continue full care, and G-J tube is not recommended, RD would recommend TPN as pt has been without adequate nutrition for >2 weeks.   NUTRITION DIAGNOSIS:   Malnutrition related to cancer and cancer related treatments, acute illness, poor appetite, vomiting, nausea as evidenced by moderate depletions of muscle mass, mild depletion of body fat, energy intake < or equal to 50% for > or equal to 5 days.  GOAL:   Patient will meet greater than or equal to 90% of their needs  MONITOR:   PO intake, Supplement acceptance, Diet advancement, Labs, Weight trends, Skin, I & O's    ASSESSMENT:   71 y.o. 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 Patient presented to the ER because of persistent nausea vomiting over the last 24 hours. Patient also has been having abdominal discomfort. Patient's last bowel movement was almost a week ago. CT scan of the abdomen shows features concerning for small bowel obstruction with transition point in the distal ileum with possible peritoneal carcinomatosis. On-call general surgeon Dr. [name] was consulted. Patient is being admitted for small bowel obstruction with known history of recent diagnosis of cholangiocarcinoma.   Met with pt in room today. Pt continues to have nausea and intermittent vomiting. Pt is not tolerating liquid diet well and not meeting his nutritional needs. Pt is drinking some Ensure but RN requests that RD switch supplement to [employer] as medical team feels Ensure may be causing some of pt's symptoms.  Boost breeze does contain less calories and protein so pt will need to drink more of these to meet his needs. Patient underwent NGT placed by IR (difficulty placement with RN due to deviated septum). He accidentally removed this in his sleep. It was replaced by IR , but again accidentally removed by patient. Plan is for pt to get gastrotomy tube placed today for palliative decompression. Per MD note, "Patient most likely has multiple partial obstructions to his small bowel from carcinomatosis and jejunostomy tube placement is not recommended." Last weight taken on this pt was from 1/31. RD spoke with RN today and requested new weight for this pt so RD can accurately assess nutritional status. Calorie Count in progress; requested by Dr. Maylene Roes. Pt not meeting estimated needs. Since family requesting full scope of care, RD would recommend considering TPN for this patient. Pt will be at high risk for refeeding.   Medications reviewed and include: cefazolin, pepcid, fentanyl, glucagon, reglan, midazolam, Maalox, zofran   Labs reviewed: Cl 97(L), BUN 57(H), creat 1.7(H), Ca 7.8(L), P 4.5 wnl 2/6, Mg 2.5(H) 2/6  Diet Order:  Diet NPO time specified Except for: Sips with Meds  Skin:  Wound (see comment) (neck, chest, abomen )  Last BM:  2/7  Height:   Ht Readings from Last 1 Encounters:  03/06/16 6' 3"  (1.905 m)    Weight:   Wt Readings from Last 1 Encounters:  03/06/16 230 lb (104.3 kg)    Ideal Body Weight:  89 kg  BMI:  Body mass index is 28.75 kg/m.  Estimated Nutritional Needs:   Kcal:  2400-2700kcal/day  Protein:  125-146g/day   Fluid:  >2.4L/day   EDUCATION NEEDS:   No education needs identified at this time  Koleen Distance, RD, LDN Pager #[phone]

## 2016-03-14 NOTE — Progress Notes (Signed)
Matthew Waller   DOB:[birth year]   [id number]   [id number]  Oncology follow-up note   Subjective: Pt was seen by Dr. Dalbert Batman this morning, and underwent G-tube placement by IR today. He feels much better after the procedure.   Objective:  Vitals:   03/14/16 1520 03/14/16 2100  BP: 111/79 121/65  Pulse: 85 93  Resp: 18   Temp: 98.6 F (37 C) 98 F (36.7 C)    Body mass index is 28.75 kg/m.  Intake/Output Summary (Last 24 hours) at 03/14/16 2227 Last data filed at 03/14/16 1245  Gross per 24 hour  Intake                0 ml  Output             3200 ml  Net            -3200 ml     Sclerae unicteric  Oropharynx clear  No peripheral adenopathy  Lungs clear -- no rales or rhonchi  Heart regular rate and rhythm  Abdomen Distended, Pleurx and G-tube are covered   MSK no focal spinal tenderness, no peripheral edema  Neuro nonfocal   CBG (last 3)   Recent Labs  03/14/16 0734 03/14/16 1703 03/14/16 2104  GLUCAP 147* 127* 114*     Labs:  Lab Results  Component Value Date   WBC 9.7 03/13/2016   HGB 13.3 03/13/2016   HCT 39.1 03/13/2016   MCV 97.3 03/13/2016   PLT 179 03/13/2016   NEUTROABS 7.7 03/13/2016   CMP Latest Ref Rng & Units 03/14/2016 03/13/2016 03/12/2016  Glucose 65 - 99 mg/dL 142(H) 142(H) 139(H)  BUN 6 - 20 mg/dL 57(H) 49(H) 44(H)  Creatinine 0.61 - 1.24 mg/dL 1.70(H) 1.65(H) 1.48(H)  Sodium 135 - 145 mmol/L 136 137 139  Potassium 3.5 - 5.1 mmol/L 4.3 4.4 4.8  Chloride 101 - 111 mmol/L 97(L) 100(L) 103  CO2 22 - 32 mmol/L 28 26 25   Calcium 8.9 - 10.3 mg/dL 7.8(L) 7.9(L) 8.0(L)  Total Protein 6.5 - 8.1 g/dL - - -  Total Bilirubin 0.3 - 1.2 mg/dL - - -  Alkaline Phos 38 - 126 U/L - - -  AST 15 - 41 U/L - - -  ALT 17 - 63 U/L - - -    Urine Studies No results for input(s): UHGB, CRYS in the last 72 hours.  Invalid input(s): UACOL, UAPR, USPG, UPH, UTP, UGL, UKET, UBIL, UNIT, UROB, [place], UEPI, UWBC, Duwayne Heck [place], [place]  Basic  Metabolic Panel:  Recent Labs Lab 03/10/16 [phone] 03/11/16 0516 03/12/16 0607 03/13/16 0534 03/14/16 0518  NA 141 140 139 137 136  K 4.3 5.5* 4.8 4.4 4.3  CL 105 105 103 100* 97*  CO2 28 26 25 26 28   GLUCOSE 108* 127* 139* 142* 142*  BUN 43* 39* 44* 49* 57*  CREATININE 1.36* 1.25* 1.48* 1.65* 1.70*  CALCIUM 7.9* 7.9* 8.0* 7.9* 7.8*  MG  --  2.6* 2.5*  --   --   PHOS  --  4.5 4.5  --   --    GFR Estimated Creatinine Clearance: 50.6 mL/min (by C-G formula based on SCr of 1.7 mg/dL (H)). Liver Function Tests: No results for input(s): AST, ALT, ALKPHOS, BILITOT, PROT, ALBUMIN in the last 168 hours. No results for input(s): LIPASE, AMYLASE in the last 168 hours. No results for input(s): AMMONIA in the last 168 hours. Coagulation profile No results for input(s): INR, PROTIME in the  last 168 hours.  CBC:  Recent Labs Lab 03/09/16 0658 03/10/16 0626 03/11/16 0516 03/12/16 0607 03/13/16 0534  WBC 11.8* 12.1* 11.2* 10.2 9.7  NEUTROABS 9.6* 9.7* 9.2* 7.9* 7.7  HGB 13.4 13.7 13.4 13.1 13.3  HCT 40.4 41.6 40.1 39.4 39.1  MCV 102.0* 100.7* 98.8 99.2 97.3  PLT 225 221 197 190 179   Cardiac Enzymes: No results for input(s): CKTOTAL, CKMB, CKMBINDEX, TROPONINI in the last 168 hours. BNP: Invalid input(s): POCBNP CBG:  Recent Labs Lab 03/13/16 1557 03/13/16 2116 03/14/16 0734 03/14/16 1703 03/14/16 2104  GLUCAP 124* 146* 147* 127* 114*   D-Dimer No results for input(s): DDIMER in the last 72 hours. Hgb A1c No results for input(s): HGBA1C in the last 72 hours. Lipid Profile No results for input(s): CHOL, HDL, LDLCALC, TRIG, CHOLHDL, LDLDIRECT in the last 72 hours. Thyroid function studies No results for input(s): TSH, T4TOTAL, T3FREE, THYROIDAB in the last 72 hours.  Invalid input(s): FREET3 Anemia work up No results for input(s): VITAMINB12, FOLATE, FERRITIN, TIBC, IRON, RETICCTPCT in the last 72 hours. Microbiology No results found for this or any previous visit  (from the past 240 hour(s)).    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 He was recently admitted with small bowel obstruction secondary to peritoneal carcinomatosis in addition to acute kidney injury, nausea, vomiting, weakness and malnutrition. He did not tolerate NG tube placement well. Request now received from surgery for palliative decompressive gastrostomy tube placement. Patient most likely has multiple partial obstructions to his small bowel from carcinomatosis EXAM: PERC PLACEMENT GASTROSTOMY FLUOROSCOPY TIME:  4 minutes (1051 uGym2 DAP) TECHNIQUE: The procedure, risks, benefits, and alternatives were explained to the patient and family. Questions regarding the procedure were encouraged and answered. The patient understands and consents to the procedure. As antibiotic prophylaxis, cefazolin 2 g was ordered pre-procedure and administered intravenously within one hour of incision. A safe percutaneous approach to the stomach was confirmed on recent CT abdomen. Preprocedure paracenteses performed through a previously placed tunneled catheter. Intravenous Fentanyl and Versed were administered as conscious sedation during continuous monitoring of the patient's level of consciousness and physiological / cardiorespiratory status by the radiology RN, with a total moderate sedation time of 35 minutes. A 5 French angiographic catheter was placed as orogastric tube. The upper abdomen was prepped with Betadine, draped in usual sterile fashion, and infiltrated locally with 1% lidocaine. Stomach was insufflated using air through the orogastric tube. A large amount of residual fluid was noted distending the stomach, so the OG tube was exchanged over an Amplatz wire for a long 12 French pigtail catheter, advanced into the stomach to allow aspiration of 1.5 L of gastric contents. The catheter was then  exchanged back for the 5 French angiographic catheter. Stomach was insufflated through the orogastric catheter. An 51 French sheath needle was advanced percutaneously into the gastric lumen under fluoroscopy. Gas could be aspirated and a small contrast injection confirmed intraluminal spread. The sheath was exchanged over a guidewire for a 9 [place] vascular sheath, through which the snare device was advanced and used to snare a guidewire passed through the orogastric tube. This was withdrawn, and the snare attached to the 20 French pull-through gastrostomy tube, which was advanced antegrade, positioned with the internal bumper securing the anterior gastric wall to the anterior abdominal wall. Small contrast injection confirms appropriate positioning. The external bumper was applied and the  catheter was flushed. COMPLICATIONS: COMPLICATIONS none IMPRESSION: 1. Technically successful 20 French pull-through gastrostomy placement under fluoroscopy. Electronically Signed   By: Lucrezia Europe M.D.   On: 03/14/2016 13:55   Dg Abd 2 Views  Result Date: 03/14/2016 CLINICAL DATA:  Follow-up small bowel obstruction. EXAM: ABDOMEN - 2 VIEW COMPARISON:  03/08/2016 FINDINGS: Tunneled ascites catheter with tip at the left mid abdomen and plastic biliary stent. Partly seen porta catheter on the right. Dilated loops of small bowel in the right lower quadrant and moderate distension of the stomach, similar to prior. Moderate volume of formed stool in the left colon. No rectal impaction. No evidence of pneumoperitoneum. IMPRESSION: Partial small bowel obstruction with distended stomach and right lower quadrant small bowel loops. Bowel gas pattern is similar to 6 days ago. Electronically Signed   By: Monte Fantasia M.D.   On: 03/14/2016 08:34    Assessment: 71 y.o. with newly diagnosed metastatic cholangiocarcinoma to peritoneum with malignant ascites, was admitted for small bowel obstruction.  1. Small bowel obstruction secondary  to peritoneal carcinomatosis, G-tube placement on 2/8  2. 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 Obstructive jaundice, status post CBD stent placement, resolved 4. Anorexia and weight loss  5. severe calorie and protein malnutrition 6. AKI secondary to dehydration, improving   Plan:  -I appreciate Dr. Darrel Hoover input and discussion with pt and his wife -surgery is not an option, SBO will not resolve, palliative chemo is not feasible due to his lack of nutrition  -we discussed the goal of care is palliative and comfort at this point, I recommend him to consider hospice. I explained to pt and his wife about hospice service, he is receptive, will think about it  -His symptoms have improved from the G-tube decompression. He is OK to eat and drink for comfort, but I doubt he will get enough nutrition and hydration due to his SBO -please all palliative care for follow up and transition to hospice  -I will remain to be his MD when he is under hospice service   Truitt Merle, MD 03/14/2016

## 2016-03-14 NOTE — Progress Notes (Addendum)
General Surgery:  Dr. Burr Medico asked me to reconsult regarding the role of surgery and possible G-tube placement.  The patient has stage IV cholangiocarcinoma with malignant ascites and a large omental cake. We cannot surgically correct his partial small bowel obstruction. I have discussed this with Dr. Vernard Gambles in interventional radiology and there does appear to be a window for radiology to place a gastrostomy tube. Open gastrostomy tube is associated with increased risk of anesthesia and leaking ascites.  I have suggested that Dr. Vernard Gambles relieve the ascites prior to the procedure to reduce the chance of ascites leak around the tube  I have had a long talk with the patient and his wife.  In fact we've had 2 conversations this morning.  I have explained that a gastrostomy tube is palliative to make him more comfortable and reduce the associated nausea and vomiting and avoid another NG tube.  I have told him that I doubt that he can use this for nutrition since he probably has multiple partial small bowel obstructions from his carcinomatosis.  I told him that if he is going to contemplate chemotherapy then nutritional support will probably have to be provided intravenously with TPN.  I suspect that he has multiple partial obstructions in his small bowel from carcinomatosis and would not recommend jejunostomy tube placement.  I told the patient and his wife that Dr. Burr Medico would discuss the role of chemotherapy, potential benefits and potential risks.  After 2 long discussions the patient says he is willing to have radiology place a gastrostomy tube.  We will see if we can get that done today.  Matthew Waller. Dalbert Batman, M.D., [facility] General and Minimally invasive Surgery Breast and Colorectal Surgery Office:   [phone] Pager:   [phone]

## 2016-03-14 NOTE — Progress Notes (Signed)
PROGRESS NOTE    BEHNAM SHAWHAN  [id number] DOB: [birth year] DOA: 03/06/2016 PCP: [name], MD     Brief Narrative:  FARAJI VARDEMAN is a 71 y.o. 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 Patient presented to the ER because of persistent nausea vomiting over the last 24 hours. Patient also has been having abdominal discomfort. Patient's last bowel movement was almost a week ago. CT scan of the abdomen shows features concerning for small bowel obstruction with transition point in the distal ileum with possible peritoneal carcinomatosis. On-call general surgeon was consulted. Patient admitted for small bowel obstruction with known history of recent diagnosis of cholangiocarcinoma.  Patient underwent NGT placed by IR (difficulty placement with RN due to deviated septum). He accidentally removed this in his sleep. It was replaced by IR , but again accidentally removed by patient. There was also discussion of placing a palliative G-tube vs replacing NGT, but this was not done as his symptoms slowly started to improve. He has been slowly progressing with diet and currently on full liquid diet. General surgery does not feel he should progress more than this currently. He is at risk of re-developing SBO due to his cancer. Oncology is following as well, planning for palliative chemo once acute illness resolved. Palliative has been involved.   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 Will continue full scope of treatment  -PleurX accessed on 2/2 with 1L peritoneal fluid removed, and again done on 2/6 with anothet Liter removed. -planning drainage today (approx 1L of ascitic fluid to be removed) and follow symptoms after that with presumed drainage 2-3 times a week (family will like M-W-F if possible, as previously instructed by IR). -Patient with ongoing nausea/vomiting today, even moving his bowels -will continue reglan TID -continue PRN antiemetics -Abd x-ray still with partial SBO features -surgery reconsulted, and doesn't feel patient is a candidate for any surgical intervention. IR consulted for venting gastrict tube placement.   Hyperkalemia -Mild, EKG without any acute ST-T changes -Resolved  -will monitor electrolytes   CKD stage 3 -Stable overall -will monitor Cr trend -patient not drinking a lot currently and losing fluids with paracentesis (intermittently)  Severe malnutrition in context of acute illness/injury  -Appreciate RD  -will continue feeding supplementation    DVT prophylaxis: lovenox  Code Status: Full Family Communication: spoke with wife at bedside Disposition Plan: pending further improvement, PO tolerance and ability to maintain adequate nutrition. Patient still with active nausea and vomiting.   Consultants:   Oncology  General Surgery  Palliative care   IR  Procedures:   None  Antimicrobials:   None    Subjective: Patient still nauseated, even reports no vomiting. Feeling weak and tired. Endorses abd is more distended.   Objective: Vitals:   03/14/16 1255 03/14/16 1300 03/14/16 1305 03/14/16 1350  BP: 130/88 138/88 132/85 127/77  Pulse: 91 92 93 84  Resp: 20 19 15 16   Temp:     98.6 F (37 C)  TempSrc:    Axillary  SpO2: 99% 99% 97% 96%  Weight:      Height:        Intake/Output Summary (Last 24 hours) at 03/14/16 1431 Last data filed at 03/14/16 1245  Gross per 24 hour  Intake              260 ml  Output             3200 ml  Net            -2940 ml   Filed Weights   03/06/16 2206  Weight: 104.3 kg (230 lb)    Examination:  General exam: Afebrile, reports feeling weak and listless. Patient with active nausea and vomiting. Had BM overnight. Also experienced mechanical fall in the bathroom while attempting to use toilet by himself (he slightly bruised right knee). Respiratory system: Clear to auscultation. Respiratory effort normal. Cardiovascular system: S1 & S2 heard, RRR. No JVD, murmurs, rubs, gallops or clicks. No pedal edema. Gastrointestinal system: Abdomen is distended, firm and with positive fluid wave, reports mild  TTP, +PleurX in place, reduce Bowel sound (but present)  Central nervous system: Alert and oriented. No focal neurological deficits. Extremities: Symmetric 4-5 x 5 power due to poor effort. Skin: No rashes, lesions or ulcers Psychiatry: Judgement and insight appear normal. Mood & affect appropriate.   Data Reviewed: I have personally reviewed following labs and imaging studies  CBC:  Recent Labs Lab 03/09/16 0658 03/10/16 0626 03/11/16 0516 03/12/16 0607 03/13/16 0534  WBC 11.8* 12.1* 11.2* 10.2 9.7  NEUTROABS 9.6* 9.7* 9.2* 7.9* 7.7  HGB 13.4 13.7 13.4 13.1 13.3  HCT 40.4 41.6 40.1 39.4 39.1  MCV 102.0* 100.7* 98.8 99.2 97.3  PLT 225 221 197 190 0000000   Basic Metabolic Panel:  Recent Labs Lab 03/10/16 0626 03/11/16 0516 03/12/16 0607 03/13/16 0534 03/14/16 0518  NA 141 140 139 137 136  K 4.3 5.5* 4.8 4.4 4.3  CL 105 105 103 100* 97*  CO2 28 26 25 26 28   GLUCOSE 108* 127* 139* 142* 142*  BUN 43* 39* 44* 49* 57*  CREATININE 1.36* 1.25* 1.48* 1.65* 1.70*  CALCIUM 7.9* 7.9* 8.0* 7.9* 7.8*  MG  --  2.6* 2.5*   --   --   PHOS  --  4.5 4.5  --   --    GFR: Estimated Creatinine Clearance: 50.6 mL/min (by C-G formula based on SCr of 1.7 mg/dL (H)).  CBG:  Recent Labs Lab 03/12/16 2108 03/13/16 0907 03/13/16 1557 03/13/16 2116 03/14/16 0734  GLUCAP 131* 145* 124* 146* 147*    No results found for this or any previous visit (from the past 240 hour(s)).    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 He was recently admitted with small bowel obstruction secondary to peritoneal carcinomatosis in addition to acute kidney injury, nausea, vomiting, weakness and malnutrition. He did not tolerate NG tube placement well. Request now received from surgery for palliative decompressive gastrostomy tube placement. Patient most likely has multiple partial obstructions to his small bowel from carcinomatosis EXAM: PERC PLACEMENT GASTROSTOMY FLUOROSCOPY TIME:  4 minutes (1051 uGym2 DAP) TECHNIQUE: The procedure, risks, benefits, and alternatives were explained to the patient and family.  Questions regarding the procedure were encouraged and answered. The patient understands and consents to the procedure. As antibiotic prophylaxis, cefazolin 2 g was ordered pre-procedure and administered intravenously within one hour of incision. A safe percutaneous approach to the stomach was confirmed on recent CT abdomen. Preprocedure paracenteses performed through a previously placed tunneled catheter. Intravenous Fentanyl and Versed were administered as conscious sedation during continuous monitoring of the patient's level of consciousness and physiological / cardiorespiratory status by the radiology RN, with a total moderate sedation time of 35 minutes. A 5 French angiographic catheter was placed as orogastric tube. The upper abdomen was prepped with Betadine, draped in usual sterile fashion, and infiltrated  locally with 1% lidocaine. Stomach was insufflated using air through the orogastric tube. A large amount of residual fluid was noted distending the stomach, so the OG tube was exchanged over an Amplatz wire for a long 12 French pigtail catheter, advanced into the stomach to allow aspiration of 1.5 L of gastric contents. The catheter was then exchanged back for the 5 French angiographic catheter. Stomach was insufflated through the orogastric catheter. An 92 French sheath needle was advanced percutaneously into the gastric lumen under fluoroscopy. Gas could be aspirated and a small contrast injection confirmed intraluminal spread. The sheath was exchanged over a guidewire for a 9 [place] vascular sheath, through which the snare device was advanced and used to snare a guidewire passed through the orogastric tube. This was withdrawn, and the snare attached to the 20 French pull-through gastrostomy tube, which was advanced antegrade, positioned with the internal bumper securing the anterior gastric wall to the anterior abdominal wall. Small contrast injection confirms appropriate positioning. The external bumper was applied and the catheter was flushed. COMPLICATIONS: COMPLICATIONS none IMPRESSION: 1. Technically successful 20 French pull-through gastrostomy placement under fluoroscopy. Electronically Signed   By: Lucrezia Europe M.D.   On: 03/14/2016 13:55   Dg Abd 2 Views  Result Date: 03/14/2016 CLINICAL DATA:  Follow-up small bowel obstruction. EXAM: ABDOMEN - 2 VIEW COMPARISON:  03/08/2016 FINDINGS: Tunneled ascites catheter with tip at the left mid abdomen and plastic biliary stent. Partly seen porta catheter on the right. Dilated loops of small bowel in the right lower quadrant and moderate distension of the stomach, similar to prior. Moderate volume of formed stool in the left colon. No rectal impaction. No evidence of pneumoperitoneum. IMPRESSION: Partial small bowel obstruction with distended stomach and right  lower quadrant small bowel loops. Bowel gas pattern is similar to 6 days ago. Electronically Signed   By: Monte Fantasia M.D.   On: 03/14/2016 08:34    Scheduled Meds: . famotidine  20 mg Oral BID  . feeding supplement  1 Container Oral TID BM  . feeding supplement (ENSURE ENLIVE)  237 mL Oral TID BM  . metoCLOPramide  5 mg Oral TID AC   Continuous Infusions:    LOS: 8 days    Time spent: 25 minutes   Barton Dubois, MD [phone] [facility] [email or web address] Password [facility] 03/14/2016, 2:31 PM

## 2016-03-14 NOTE — Progress Notes (Signed)
1 liter of fluid drained from pleurex drain, with no complications.  Patient states he feels less pressure.

## 2016-03-14 NOTE — Progress Notes (Signed)
PT Cancellation Note  Patient Details Name: Matthew Waller MRN: [id number] DOB: 1945/09/23   Cancelled Treatment:    Reason Eval/Treat Not Completed: spoke with RN. Pt had procedure this am and is still recovering. Will hold PT today per RN recs. Will check back tomorrow.    Weston Anna, MPT Pager: [phone]

## 2016-03-14 NOTE — Procedures (Signed)
18F gastrostomy tube placement under fluoro No complication No blood loss. See complete dictation in [facility] PACS.  

## 2016-03-14 NOTE — Progress Notes (Signed)
Calorie Count Day 1  Estimated Nutritional Needs:   Kcal:  2400-2700kcal/day  Protein:  125-146g/day  Fluid:  >2.4L/day   Breakfast: 1 hot chocolate,  1/2 carton of milk 1/2 cup of orange juice.  Total- 180kcal, 5g protein   Lunch:  2% milk 6oz 1 italian ice Tomato soup 3/4 serving  Total- 290kcal, 9g protein  Dinner:  2% milk 4oz 1/2 chocolate ice cream 2 hot chocolates   Total- 290kcal, 7g protein   *patient vomited after this meal  Supplements:  1 Ensure 350kcal and 20g protein   Total: 1110kcal (46% estimated needs) and 21g protein (33% estimated needs)  Koleen Distance, RD, LDN Pager #- [phone]

## 2016-03-14 NOTE — Progress Notes (Signed)
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 He was recently admitted with small bowel obstruction secondary to peritoneal carcinomatosis in addition to acute kidney injury, nausea, vomiting, weakness and malnutrition. He did not tolerate NG tube placement well. Request now received from surgery for palliative decompressive gastrostomy tube placement. Patient most likely has multiple partial obstructions to his small bowel from carcinomatosis and jejunostomy tube placement is not recommended. He currently denies fever, headache, chest pain, dyspnea, cough, pain or abnormal bleeding. Past Medical History:  Diagnosis Date  . Abdominal distension 02/2016   Past Surgical History:  Procedure Laterality Date  . COSMETIC SURGERY     eyebrows  . DENTAL SURGERY    . ERCP N/A 02/28/2016   Procedure: ENDOSCOPIC RETROGRADE CHOLANGIOPANCREATOGRAPHY (ERCP);  Surgeon: Carol Ada, MD;  Location: [facility] ENDOSCOPY;  Service: Endoscopy;  Laterality: N/A;  . IR GENERIC HISTORICAL  03/06/2016   IR US GUIDE VASC ACCESS RIGHT 03/06/2016 Markus Daft, MD [facility]-INTER[facility] RAD  . IR GENERIC HISTORICAL  03/06/2016   IR IMAGE GUIDED DRAINAGE PERCUT CATH  PERITONEAL RETROPERIT 03/06/2016 Markus Daft, MD [facility]  . IR GENERIC HISTORICAL  03/06/2016   IR FLUORO GUIDE PORT INSERTION RIGHT 03/06/2016 Markus Daft, MD [facility]     Allergies: Patient has no known allergies.  Medications: Prior to Admission medications   Medication Sig Start Date End Date Taking? Authorizing  Provider  Carboxymethylcell-Hypromellose (GENTEAL OP) Place 1 drop into both eyes daily.   Yes Historical Provider, MD  Carboxymethylcell-Hypromellose (GENTEAL) 0.25-0.3 % GEL Place 1 drop into both eyes at bedtime.   Yes Historical Provider, MD  magnesium citrate SOLN Take 1 Bottle by mouth once.   Yes Historical Provider, MD  mirtazapine (REMERON) 15 MG tablet Take 1 tablet (15 mg total) by mouth at bedtime. 03/05/16  Yes Truitt Merle, MD  oxyCODONE (ROXICODONE) 5 MG immediate release tablet Take 1 tablet (5 mg total) by mouth every 6 (six) hours as needed for severe pain. 03/01/16  Yes Nishant Dhungel, MD  polyethylene glycol (MIRALAX / GLYCOLAX) packet Take 17 g by mouth daily as needed. Patient taking differently: Take 17 g by mouth daily as needed for moderate constipation.  03/01/16  Yes Nishant Dhungel, MD  Wheat Dextrin (BENEFIBER DRINK MIX PO) Take 1 scoop by mouth daily.   Yes Historical Provider, MD  zolpidem (AMBIEN) 5 MG tablet Take 1 tablet (5 mg total) by mouth at bedtime as needed for sleep. 03/01/16  Yes Nishant Dhungel, MD  Ginger, Zingiber officinalis, (GINGER ROOT) 550 MG CAPS Take 550 mg by mouth daily.    Historical Provider, MD  GLUCOSAMINE-CHONDROITIN PO Take 2 capsules by mouth daily.    Historical Provider, MD  Lactulose 20 GM/30ML SOLN Take 30 mLs (20 g total) by mouth every 2 (two) hours as needed. Patient taking differently: Take 30 mLs by mouth every 2 (two) hours as needed (constipation).  03/05/16   Truitt Merle, MD  loratadine (CLARITIN) 10 MG tablet Take 10 mg by mouth daily.    Historical Provider, MD  Omega-3 Fatty Acids (FISH OIL) 1000 MG CAPS Take 1,000 mg by mouth daily.  Historical Provider, MD  OVER THE COUNTER MEDICATION Take 1 capsule by mouth daily. Prostate Health    Historical Provider, MD     Vital Signs: BP 129/90 (BP Location: Right Arm)   Pulse 93   Temp 97.4 F (36.3 C) (Axillary)   Resp 20   Ht 6\' 3"  (1.905 m)   Wt 230 lb (104.3 kg)   SpO2 96%    BMI 28.75 kg/m   Physical Exam patient awake, alert. Chest with slightly diminished breath sounds bases; clean, intact, nontender right chest wall Port-A-Cath; heart with regular rate and rhythm; abdomen distended, few bowel sounds. Intact right abdominal wall Pleurx catheter; trace pretibial edema bilaterally.  Imaging: Dg Abd 2 Views  Result Date: 03/14/2016 CLINICAL DATA:  Follow-up small bowel obstruction. EXAM: ABDOMEN - 2 VIEW COMPARISON:  03/08/2016 FINDINGS: Tunneled ascites catheter with tip at the left mid abdomen and plastic biliary stent. Partly seen porta catheter on the right. Dilated loops of small bowel in the right lower quadrant and moderate distension of the stomach, similar to prior. Moderate volume of formed stool in the left colon. No rectal impaction. No evidence of pneumoperitoneum. IMPRESSION: Partial small bowel obstruction with distended stomach and right lower quadrant small bowel loops. Bowel gas pattern is similar to 6 days ago. Electronically Signed   By: Monte Fantasia M.D.   On: 03/14/2016 08:34    Labs:  CBC:  Recent Labs  03/10/16 0626 03/11/16 0516 03/12/16 0607 03/13/16 0534  WBC 12.1* 11.2* 10.2 9.7  HGB 13.7 13.4 13.1 13.3  HCT 41.6 40.1 39.4 39.1  PLT 221 197 190 179    COAGS:  Recent Labs  02/26/16 1424 03/06/16 0922  INR 1.11 1.26  APTT 29  --     BMP:  Recent Labs  03/11/16 0516 03/12/16 0607 03/13/16 0534 03/14/16 0518  NA 140 139 137 136  K 5.5* 4.8 4.4 4.3  CL 105 103 100* 97*  CO2 26 25 26 28   GLUCOSE 127* 139* 142* 142*  BUN 39* 44* 49* 57*  CALCIUM 7.9* 8.0* 7.9* 7.8*  CREATININE 1.25* 1.48* 1.65* 1.70*  GFRNONAA 55* 45* 40* 38*  GFRAA >60 52* 46* 44*    LIVER FUNCTION TESTS:  Recent Labs  02/28/16 0834 02/29/16 0712 02/29/16 1414 03/06/16 1730  BILITOT 3.0* 1.8* 1.0 0.8  AST 163* 95* 78* 45*  ALT 189* 137* 118* 36  ALKPHOS 328* 276* 250* 107  PROT 5.9* 6.6 6.1* 6.7  ALBUMIN 2.6* 2.6* 2.6* 2.3*     Assessment and Plan: Pt with history of stage IV metastatic cholangiocarcinoma to the peritoneum with malignant ascites. He was recently admitted with small bowel obstruction secondary to peritoneal carcinomatosis in addition to acute kidney injury, nausea, vomiting, weakness and malnutrition. He did not tolerate NG tube placement well. Request now received from surgery for palliative decompressive gastrostomy tube placement. Patient most likely has multiple partial obstructions to his small bowel from carcinomatosis and jejunostomy tube placement is not recommended. He has previously undergone paracentesis, omental biopsy, tunneled peritoneal catheter placement as well as Port-A-Cath placement by IR in past. Imaging studies have been reviewed by Dr. Vernard Gambles and case discussed with Dr. Dalbert Batman. Patient appears to be candidate for decompressive gastrostomy tube placement.Risks and benefits discussed with the patient/family including, but not limited to the need for a barium enema during the procedure, bleeding, infection, peritonitis, or damage to adjacent structures. All of the patient's questions were answered, patient is agreeable to proceed. Consent signed and  in chart. Procedure tentatively scheduled for later today.   Electronically Signed: D. Rowe Robert 03/14/2016, 10:28 AM   I spent a total of 30 minutes at the the patient's bedside AND on the patient's hospital floor or unit, greater than 50% of which was counseling/coordinating care for decompressive gastrostomy tube placement    Patient ID: Matthew Waller, male   DOB: [birth year], 71 y.o.   MRN: [id number]

## 2016-03-14 NOTE — Progress Notes (Signed)
Patient awoke confused and nauseous. Gave medication, see MAR. He was talking about an experiment with animals. He also exhibited expressive aphasia in difficulty finding his words. He was able to be reoriented easily. Will continue to monitor.

## 2016-03-15 ENCOUNTER — Telehealth: Payer: Self-pay | Admitting: *Deleted

## 2016-03-15 ENCOUNTER — Encounter (HOSPITAL_COMMUNITY): Payer: Self-pay

## 2016-03-15 DIAGNOSIS — Z7189 Other specified counseling: Secondary | ICD-10-CM

## 2016-03-15 LAB — GLUCOSE, CAPILLARY
GLUCOSE-CAPILLARY: 124 mg/dL — AB (ref 65–99)
GLUCOSE-CAPILLARY: 123 mg/dL — AB (ref 65–99)
GLUCOSE-CAPILLARY: 132 mg/dL — AB (ref 65–99)

## 2016-03-15 MED ORDER — LACTATED RINGERS IV BOLUS (SEPSIS)
INTRAVENOUS | Status: DC
  Administered 2016-03-15 – 2016-03-16 (×3): via INTRAVENOUS
  Filled 2016-03-15: qty 1000

## 2016-03-15 MED ORDER — METOCLOPRAMIDE HCL 5 MG/5ML PO SOLN
5.0000 mg | Freq: Three times a day (TID) | ORAL | Status: DC
  Administered 2016-03-16: 5 mg via ORAL
  Filled 2016-03-15 (×4): qty 5

## 2016-03-15 MED ORDER — LACTATED RINGERS IV SOLN
INTRAVENOUS | Status: DC
  Filled 2016-03-15: qty 1000

## 2016-03-15 NOTE — Care Management Important Message (Signed)
Important Message  Patient Details  Name: Matthew Waller MRN: [id number] Date of Birth: 12-25-1945   Medicare Important Message Given:  Yes    Kerin Salen 03/15/2016, 1:10 PMImportant Message  Patient Details  Name: Matthew Waller MRN: [id number] Date of Birth: [birth year]   Medicare Important Message Given:  Yes    Kerin Salen 03/15/2016, 1:09 PM

## 2016-03-15 NOTE — Progress Notes (Addendum)
Subjective: Percutaneous gastrostomy tube placed in interventional radiology yesterday. He had immediate relief This tube has drained 2000 mL in 12 hours  He is not on any IV fluids, has gastric losses, and is making ascites He is at risk for dehydration and I have restarted IV fluids I spoke with the patient and his wife this morning about the gastrostomy tube.  Told him that certainly could be used to decompress and provide comfort.  I told them that I was uncertain whether it could be used for tube feedings.  That remains to be seen.  Objective: Vital signs in last 24 hours: Temp:  [98 F (36.7 C)-98.6 F (37 C)] 98.5 F (36.9 C) (02/09 0446) Pulse Rate:  [83-110] 110 (02/09 0446) Resp:  [15-26] 18 (02/09 0446) BP: (101-145)/(65-94) 101/73 (02/09 0446) SpO2:  [96 %-100 %] 96 % (02/09 0446) Last BM Date: 03/14/16  Intake/Output from previous day: 02/08 0701 - 02/09 0700 In: 50 [IV Piggyback:50] Out: 5000 [Drains:3400] Intake/Output this shift: Total I/O In: 50 [IV Piggyback:50] Out: 1800 [Drains:1800]  General appearance: Lethargic but in no distress.  Skin warm and dry.  Denies pain or nausea Resp:  Clear to auscultation bilaterally GI:  Soft.  Palpable mass filling left upper quadrant nontender.  Gastrostomy tube left subcostal region looks good.  Dressing dry.  No ascitic leak.  Lab Results:   Recent Labs  03/12/16 0607 03/13/16 0534  WBC 10.2 9.7  HGB 13.1 13.3  HCT 39.4 39.1  PLT 190 179   BMET  Recent Labs  03/13/16 0534 03/14/16 0518  NA 137 136  K 4.4 4.3  CL 100* 97*  CO2 26 28  GLUCOSE 142* 142*  BUN 49* 57*  CREATININE 1.65* 1.70*  CALCIUM 7.9* 7.8*   PT/INR No results for input(s): LABPROT, INR in the last 72 hours. ABG No results for input(s): PHART, HCO3 in the last 72 hours.  Invalid input(s): PCO2, PO2  Studies/Results: Ir Gastrostomy Tube Mod Sed  Result Date: 03/14/2016 CLINICAL DATA:  stage IV metastatic cholangiocarcinoma  to the peritoneum with malignant ascites. He was recently admitted with small bowel obstruction secondary to peritoneal carcinomatosis in addition to acute kidney injury, nausea, vomiting, weakness and malnutrition. He did not tolerate NG tube placement well. Request now received from surgery for palliative decompressive gastrostomy tube placement. Patient most likely has multiple partial obstructions to his small bowel from carcinomatosis EXAM: PERC PLACEMENT GASTROSTOMY FLUOROSCOPY TIME:  4 minutes (1051 uGym2 DAP) TECHNIQUE: The procedure, risks, benefits, and alternatives were explained to the patient and family. Questions regarding the procedure were encouraged and answered. The patient understands and consents to the procedure. As antibiotic prophylaxis, cefazolin 2 g was ordered pre-procedure and administered intravenously within one hour of incision. A safe percutaneous approach to the stomach was confirmed on recent CT abdomen. Preprocedure paracenteses performed through a previously placed tunneled catheter. Intravenous Fentanyl and Versed were administered as conscious sedation during continuous monitoring of the patient's level of consciousness and physiological / cardiorespiratory status by the radiology RN, with a total moderate sedation time of 35 minutes. A 5 French angiographic catheter was placed as orogastric tube. The upper abdomen was prepped with Betadine, draped in usual sterile fashion, and infiltrated locally with 1% lidocaine. Stomach was insufflated using air through the orogastric tube. A large amount of residual fluid was noted distending the stomach, so the OG tube was exchanged over an Amplatz wire for a long 12 French pigtail catheter, advanced into the stomach to  allow aspiration of 1.5 L of gastric contents. The catheter was then exchanged back for the 5 French angiographic catheter. Stomach was insufflated through the orogastric catheter. An 41 French sheath needle was advanced  percutaneously into the gastric lumen under fluoroscopy. Gas could be aspirated and a small contrast injection confirmed intraluminal spread. The sheath was exchanged over a guidewire for a 9 [place] vascular sheath, through which the snare device was advanced and used to snare a guidewire passed through the orogastric tube. This was withdrawn, and the snare attached to the 20 French pull-through gastrostomy tube, which was advanced antegrade, positioned with the internal bumper securing the anterior gastric wall to the anterior abdominal wall. Small contrast injection confirms appropriate positioning. The external bumper was applied and the catheter was flushed. COMPLICATIONS: COMPLICATIONS none IMPRESSION: 1. Technically successful 20 French pull-through gastrostomy placement under fluoroscopy. Electronically Signed   By: Lucrezia Europe M.D.   On: 03/14/2016 13:55   Dg Abd 2 Views  Result Date: 03/14/2016 CLINICAL DATA:  Follow-up small bowel obstruction. EXAM: ABDOMEN - 2 VIEW COMPARISON:  03/08/2016 FINDINGS: Tunneled ascites catheter with tip at the left mid abdomen and plastic biliary stent. Partly seen porta catheter on the right. Dilated loops of small bowel in the right lower quadrant and moderate distension of the stomach, similar to prior. Moderate volume of formed stool in the left colon. No rectal impaction. No evidence of pneumoperitoneum. IMPRESSION: Partial small bowel obstruction with distended stomach and right lower quadrant small bowel loops. Bowel gas pattern is similar to 6 days ago. Electronically Signed   By: Monte Fantasia M.D.   On: 03/14/2016 08:34    Anti-infectives: Anti-infectives    Start     Dose/Rate Route Frequency Ordered Stop   03/14/16 1045  ceFAZolin (ANCEF) IVPB 2g/100 mL premix     2 g 200 mL/hr over 30 Minutes Intravenous To Radiology 03/14/16 1041 03/14/16 1251      Assessment/Plan:  Metastatic cholangiocarcinoma with metastasis to peritoneum, omentum, malignant  ascites.    There is no role for abdominal surgery due to the advanced nature of his disease and the inoperability of his tumor due to his widespread peritoneal presents     Dr. Burr Medico Has decided not to proceed with palliative chemotherapy, and has discussed this with patient and wife.  Partial small bowel obstruction secondary to peritoneal carcinomatosis POD #1. - Percutaneous gastrostomy tube placement by interventional radiology .  The primary managing service may choose to try to use this tube or tube feedings.  I would start slowly.  I am not confluent that this will work due to downstream obstruction.   Obstructive jaundice, status post CBD stent placement Anorexia and weight loss Moderate protein calorie malnutrition-doubt that he will tolerate chemotherapy unless secure nutritional support is achieved.  We can try enteral feedings but I am skeptical..  Dehydration due to gastric and peritoneal fluid losses-IV fluids restarted this morning  Our surgical service will be available as needed.   LOS: 9 days    [place] Oborn M 03/15/2016

## 2016-03-15 NOTE — Progress Notes (Signed)
Referring Physician(s): Dr. Dalbert Batman  Supervising Physician: Jacqulynn Cadet  Patient Status:  [facility] - In-pt  Chief Complaint:  malnutrition, nausea/vomiting   Subjective:  Matthew Waller is doing well this morning. He states that he feels better today and he has no acute complaints at this time. 2 L of clear, green fluid has been collected from G tube since yesterday. He denies any acute abdominal pain, N/V.  Allergies: Patient has no known allergies.  Medications: Prior to Admission medications   Medication Sig Start Date End Date Taking? Authorizing Provider  Carboxymethylcell-Hypromellose (GENTEAL OP) Place 1 drop into both eyes daily.   Yes Historical Provider, MD  Carboxymethylcell-Hypromellose (GENTEAL) 0.25-0.3 % GEL Place 1 drop into both eyes at bedtime.   Yes Historical Provider, MD  magnesium citrate SOLN Take 1 Bottle by mouth once.   Yes Historical Provider, MD  mirtazapine (REMERON) 15 MG tablet Take 1 tablet (15 mg total) by mouth at bedtime. 03/05/16  Yes Truitt Merle, MD  oxyCODONE (ROXICODONE) 5 MG immediate release tablet Take 1 tablet (5 mg total) by mouth every 6 (six) hours as needed for severe pain. 03/01/16  Yes Nishant Dhungel, MD  polyethylene glycol (MIRALAX / GLYCOLAX) packet Take 17 g by mouth daily as needed. Patient taking differently: Take 17 g by mouth daily as needed for moderate constipation.  03/01/16  Yes Nishant Dhungel, MD  Wheat Dextrin (BENEFIBER DRINK MIX PO) Take 1 scoop by mouth daily.   Yes Historical Provider, MD  zolpidem (AMBIEN) 5 MG tablet Take 1 tablet (5 mg total) by mouth at bedtime as needed for sleep. 03/01/16  Yes Nishant Dhungel, MD  Ginger, Zingiber officinalis, (GINGER ROOT) 550 MG CAPS Take 550 mg by mouth daily.    Historical Provider, MD  GLUCOSAMINE-CHONDROITIN PO Take 2 capsules by mouth daily.    Historical Provider, MD  Lactulose 20 GM/30ML SOLN Take 30 mLs (20 g total) by mouth every 2 (two) hours as needed. Patient taking  differently: Take 30 mLs by mouth every 2 (two) hours as needed (constipation).  03/05/16   Truitt Merle, MD  loratadine (CLARITIN) 10 MG tablet Take 10 mg by mouth daily.    Historical Provider, MD  Omega-3 Fatty Acids (FISH OIL) 1000 MG CAPS Take 1,000 mg by mouth daily.    Historical Provider, MD  OVER THE COUNTER MEDICATION Take 1 capsule by mouth daily. Prostate Health    Historical Provider, MD     Vital Signs: BP 101/73 (BP Location: Right Arm)   Pulse (!) 110   Temp 98.5 F (36.9 C) (Oral)   Resp 18   Ht 6\' 3"  (1.905 m)   Wt 230 lb (104.3 kg)   SpO2 96%   BMI 28.75 kg/m   Physical Exam  Gastrostomy tube site was clean, dry, and intact with no leakage or signs of infection, site mildly tender   Imaging: Ir Gastrostomy Tube Mod Sed  Result Date: 03/14/2016 CLINICAL DATA:  stage IV metastatic cholangiocarcinoma to the peritoneum with malignant ascites. He was recently admitted with small bowel obstruction secondary to peritoneal carcinomatosis in addition to acute kidney injury, nausea, vomiting, weakness and malnutrition. He did not tolerate NG tube placement well. Request now received from surgery for palliative decompressive gastrostomy tube placement. Patient most likely has multiple partial obstructions to his small bowel from carcinomatosis EXAM: PERC PLACEMENT GASTROSTOMY FLUOROSCOPY TIME:  4 minutes (1051 uGym2 DAP) TECHNIQUE: The procedure, risks, benefits, and alternatives were explained to the patient and family.  Questions regarding the procedure were encouraged and answered. The patient understands and consents to the procedure. As antibiotic prophylaxis, cefazolin 2 g was ordered pre-procedure and administered intravenously within one hour of incision. A safe percutaneous approach to the stomach was confirmed on recent CT abdomen. Preprocedure paracenteses performed through a previously placed tunneled catheter. Intravenous Fentanyl and Versed were administered as conscious  sedation during continuous monitoring of the patient's level of consciousness and physiological / cardiorespiratory status by the radiology RN, with a total moderate sedation time of 35 minutes. A 5 French angiographic catheter was placed as orogastric tube. The upper abdomen was prepped with Betadine, draped in usual sterile fashion, and infiltrated locally with 1% lidocaine. Stomach was insufflated using air through the orogastric tube. A large amount of residual fluid was noted distending the stomach, so the OG tube was exchanged over an Amplatz wire for a long 12 French pigtail catheter, advanced into the stomach to allow aspiration of 1.5 L of gastric contents. The catheter was then exchanged back for the 5 French angiographic catheter. Stomach was insufflated through the orogastric catheter. An 34 French sheath needle was advanced percutaneously into the gastric lumen under fluoroscopy. Gas could be aspirated and a small contrast injection confirmed intraluminal spread. The sheath was exchanged over a guidewire for a 9 [place] vascular sheath, through which the snare device was advanced and used to snare a guidewire passed through the orogastric tube. This was withdrawn, and the snare attached to the 20 French pull-through gastrostomy tube, which was advanced antegrade, positioned with the internal bumper securing the anterior gastric wall to the anterior abdominal wall. Small contrast injection confirms appropriate positioning. The external bumper was applied and the catheter was flushed. COMPLICATIONS: COMPLICATIONS none IMPRESSION: 1. Technically successful 20 French pull-through gastrostomy placement under fluoroscopy. Electronically Signed   By: Lucrezia Europe M.D.   On: 03/14/2016 13:55   Dg Abd 2 Views  Result Date: 03/14/2016 CLINICAL DATA:  Follow-up small bowel obstruction. EXAM: ABDOMEN - 2 VIEW COMPARISON:  03/08/2016 FINDINGS: Tunneled ascites catheter with tip at the left mid abdomen and plastic  biliary stent. Partly seen porta catheter on the right. Dilated loops of small bowel in the right lower quadrant and moderate distension of the stomach, similar to prior. Moderate volume of formed stool in the left colon. No rectal impaction. No evidence of pneumoperitoneum. IMPRESSION: Partial small bowel obstruction with distended stomach and right lower quadrant small bowel loops. Bowel gas pattern is similar to 6 days ago. Electronically Signed   By: Monte Fantasia M.D.   On: 03/14/2016 08:34    Labs:  CBC:  Recent Labs  03/10/16 0626 03/11/16 0516 03/12/16 0607 03/13/16 0534  WBC 12.1* 11.2* 10.2 9.7  HGB 13.7 13.4 13.1 13.3  HCT 41.6 40.1 39.4 39.1  PLT 221 197 190 179    COAGS:  Recent Labs  02/26/16 1424 03/06/16 0922  INR 1.11 1.26  APTT 29  --     BMP:  Recent Labs  03/11/16 0516 03/12/16 0607 03/13/16 0534 03/14/16 0518  NA 140 139 137 136  K 5.5* 4.8 4.4 4.3  CL 105 103 100* 97*  CO2 26 25 26 28   GLUCOSE 127* 139* 142* 142*  BUN 39* 44* 49* 57*  CALCIUM 7.9* 8.0* 7.9* 7.8*  CREATININE 1.25* 1.48* 1.65* 1.70*  GFRNONAA 55* 45* 40* 38*  GFRAA >60 52* 46* 44*    LIVER FUNCTION TESTS:  Recent Labs  02/28/16 0834 02/29/16 0712 02/29/16 1414  03/06/16 1730  BILITOT 3.0* 1.8* 1.0 0.8  AST 163* 95* 78* 45*  ALT 189* 137* 118* 36  ALKPHOS 328* 276* 250* 107  PROT 5.9* 6.6 6.1* 6.7  ALBUMIN 2.6* 2.6* 2.6* 2.3*    Assessment and Plan: S/p decompressive G tube 03/14/16, site clean, dry, and intact. Additional plans per oncology and surgery.  Electronically Signed: D. Alden Server, PA-S 03/15/2016, 9:01 AM   I spent a total of 15 mins at the the patient's bedside AND on the patient's hospital floor or unit, greater than 50% of which was counseling/coordinating care for gastrostomy tube    Patient ID: Matthew Waller, male   DOB: [birth year], 71 y.o.   MRN: [id number]

## 2016-03-15 NOTE — Progress Notes (Signed)
Date: March 15, 2016 Discharge Planning: Home with hospice.  Patient and wife have chosen- [facility]-Referral given with need for hospital bed and rolling walker, no other dme needs made known. Velva Harman, RN, BSN, [place]   [phone]

## 2016-03-15 NOTE — Progress Notes (Signed)
Nutrition Brief Note  Calorie Count completed today, pt not meeting estimated needs. Pt weighed today on standing scale, pt weighed 195lbs today which is a 33lb(15%) weight loss since admit. Some weight loss attributed to fluid changes; but, this amount of weight loss is still severe. Pt had PEG tube placed yesterday for comfort decompression. Pt reports feeling much better after PEG placement. PEG tube drained 2016ml in the first 12 hours after placement and has continued to produce large amounts of fluid. MD is uncertain if tube will be able to be used for feedings r/t downstream obstructions. Per MD note, will not proceed with chemotherapy d/t poor nutritional status. No abdominal surgery due to the advanced nature of his disease and the inoperability of his tumor due to his widespread peritoneal presents. Possible transition to palliative and comfort care. Continue to encourage nutrition supplements for comfort if pt tolerating. If decided to start trickle feedings, please consult RD.    Koleen Distance, RD, LDN Pager #- [phone]

## 2016-03-15 NOTE — Progress Notes (Signed)
Physical Therapy Treatment Patient Details Name: Matthew Waller MRN: [id number] DOB: 10/13/1945 Today's Date: 03/15/2016    History of Present Illness 71 yo male admitted with SBO in setting of carcinomatosis. Hx of met cholangiocarcinoma, pleurx cathether placement 1/31.     PT Comments    Pt continues to participate well. Noted intermittent unsteadiness. He liked the rollator. Pt will likely need a walker (4 wheeled or 2 wheeled) for safe ambulation at discharge. Will continue to follow.   Follow Up Recommendations  Home health PT;Supervision for mobility/OOB     Equipment Recommendations  4 wheeled walker or 2 wheeled walker   Recommendations for Other Services       Precautions / Restrictions Precautions Precautions: Fall Restrictions Weight Bearing Restrictions: No    Mobility  Bed Mobility               General bed mobility comments: in recliner.   Transfers Overall transfer level: Needs assistance Equipment used: 4-wheeled walker Transfers: Sit to/from Stand Sit to Stand: Min guard         General transfer comment: close guard for safety. VCs hand placement, safe operation of walker  Ambulation/Gait Ambulation/Gait assistance: Min assist;Min guard Ambulation Distance (Feet): 400 Feet   Gait Pattern/deviations: Step-through pattern;Decreased stride length     General Gait Details: Intermittent assist to stabilize. Noted stumbling. Cues for pt to slow pace and ensure good swing through of LEs.    Stairs            Wheelchair Mobility    Modified Rankin (Stroke Patients Only)       Balance Overall balance assessment: Needs assistance           Standing balance-Leahy Scale: Fair                      Cognition Arousal/Alertness: Awake/alert Behavior During Therapy: WFL for tasks assessed/performed Overall Cognitive Status: Within Functional Limits for tasks assessed                      Exercises       General Comments        Pertinent Vitals/Pain Pain Assessment: No/denies pain    Home Living                      Prior Function            PT Goals (current goals can now be found in the care plan section) Progress towards PT goals: Progressing toward goals    Frequency    Min 3X/week      PT Plan Frequency needs to be updated    Co-evaluation             End of Session Equipment Utilized During Treatment: Gait belt Activity Tolerance: Patient tolerated treatment well Patient left: in chair;with call bell/phone within reach;with family/visitor present;with chair alarm set     Time: 0947-1004 PT Time Calculation (min) (ACUTE ONLY): 17 min  Charges:  $Gait Training: 8-22 mins                    G Codes:       , MPT Pager: [phone]   

## 2016-03-15 NOTE — Progress Notes (Addendum)
Notified by Matthew Waller of family request for [facility]ospice and [facility] services at home after discharge. Chart and patient information currently under review to confirm hospice eligibility.   Spoke with patient and wife, Matthew Waller at bedside to initiate education related to hospice philosophy, services and team approach to care. Family verbalized understanding of the information provided. Per discussion plan is for discharge to home by personal vehicle with wife tomorrow.   Patient will need prescriptions for discharge comfort medications.   RN please demonstrate to wife how to empty drainage bag, prior to discharge.  DME needs discussed and family requested hospital bed, OBT, walker, 3N1, and W/C for delivery to the home today.  [facility] equipment manager Jewel Ysidro Evert notified and will contact [facility] to arrange delivery to the home.  The home address has been verified and is correct in the chart; Matthew Waller to be contacted to arrange time of delivery.   Completed discharge summary will need to be faxed to [facility] at [phone] when final.  Please notify [facility] when patient is ready to leave unit at discharge-call [phone].   [facility] information and contact numbers have been given to [place] during visit.   Please call with any questions.   Thank You,  Freddi Starr RN, [facility] Liaison  [phone]

## 2016-03-15 NOTE — Telephone Encounter (Signed)
Received results of [facility].  Gave results to Dr. Burr Medico for review.

## 2016-03-15 NOTE — Progress Notes (Signed)
PROGRESS NOTE    Matthew Waller  [id number] DOB: [birth year] DOA: 03/06/2016 PCP: [name], MD     Brief Narrative:  Matthew Waller is a 71 y.o. 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 Patient presented to the ER because of persistent nausea vomiting over the last 24 hours. Patient also has been having abdominal discomfort. Patient's last bowel movement was almost a week ago. CT scan of the abdomen shows features concerning for small bowel obstruction with transition point in the distal ileum with possible peritoneal carcinomatosis. On-call general surgeon was consulted. Patient admitted for small bowel obstruction with known history of recent diagnosis of cholangiocarcinoma.  Patient underwent NGT placed by IR (difficulty placement with RN due to deviated septum). He accidentally removed this in his sleep. It was replaced by IR , but again accidentally removed by patient. There was also discussion of placing a palliative G-tube vs replacing NGT, but this was not done as his symptoms slowly started to improve. He has been slowly progressing with diet and currently on full liquid diet. General surgery does not feel he should progress more than this currently. He is at risk of re-developing SBO due to his cancer. Oncology is following as well, planning for palliative chemo once acute illness resolved. Palliative has been involved.   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 After further Waves discussion, patient now DNR and looking to pursuit symptomatic management and comfort care. -CM for home hospice requested. -PleurX accessed on 2/2 with 1L peritoneal fluid removed, and again done on 2/6 and 2/8 with anothet Liter removed each day. -planning drainage as needed base on patient symptoms and accumulation rate -Patient with ongoing nausea intermittently, but no further vomiting today, continue moving his bowels (loose) -will continue reglan TID -continue PRN antiemetics -Abd x-ray (2/8) still with persistent partial SBO features -IR consulted for venting gastrict tube placement on 03/14/16 -after further discussion plan is for full comfort care and symptoms management   Hyperkalemia -Mild, EKG without any acute ST-T changes -Resolved  -will no follow further labs now -focus on comfort/symptomatic management   CKD stage 3 -Stable overall -patient not drinking a lot currently and losing fluids with paracentesis (intermittently) -Cr trended up some -now no further lab draws; focus on comfort/symptomatic management.  Severe malnutrition in context of acute illness/injury  -After further GOC discussion and irreversibility of his condition; will pursuit comfort feeding and symptomatic management    DVT prophylaxis: lovenox  Code Status: DNR Family Communication: spoke with wife at bedside Disposition Plan: after long multidisciplinary discussion, patient and family has opted to pursuit comfort care and symptoms management only. Will arrange for home hospice service and delivered equipment. Anticipated discharge on 03/16/16   Consultants:   Oncology  General Surgery  Palliative care   IR  Procedures:  None  Antimicrobials:   None    Subjective: Patient still with mild intermittent nausea, but no further vomiting. Poor appetitie and intake. Tolerated venting gastrostomy tube placement w/o problems; also had 1L ascitic fluid removed on 2/8.  Objective: Vitals:   03/15/16 1108 03/15/16 1300 03/15/16 1346 03/15/16 2035  BP:  120/71  131/74  Pulse:  86  86  Resp:  18  18  Temp:  (!) 94 F (34.4 C) 99 F (37.2 C) 99 F (37.2 C)  TempSrc:  Oral Axillary Oral  SpO2:  96%  98%  Weight: 88.4 kg (194 lb 14.4 oz)     Height:        Intake/Output Summary (Last 24 hours) at 03/15/16 2237 Last data filed at 03/15/16 2200  Gross per 24 hour  Intake          2157.91 ml  Output             5500 ml  Net         -3342.09 ml   Filed Weights   03/06/16 2206 03/15/16 1108  Weight: 104.3 kg (230 lb) 88.4 kg (194 lb 14.4 oz)    Examination:  General exam: Afebrile, reports feeling weak and listless. Patient reports feeling better today; just mild intermittent nausea. No further vomiting and no hiccups.  Respiratory system: Clear to auscultation. Respiratory effort normal. Cardiovascular system: S1 & S2 heard, RRR. No JVD, murmurs, rubs, gallops or clicks. No pedal edema. Gastrointestinal system: Abdomen is a lot less distended and softer. Had 1L removed yesterday through PleurX in place, reduce Bowel sound (but present). Now also with gastrostomy venting tube in place. Central nervous system: Alert and oriented. No focal neurological deficits. Extremities: Symmetric 4-5 x 5 power due to poor effort. Skin: No rashes, lesions or ulcers Psychiatry: Judgement and insight appear normal. Mood & affect appropriate.   Data Reviewed: I have personally reviewed following labs and imaging studies  CBC:  Recent Labs Lab 03/09/16 0658 03/10/16 0626 03/11/16 0516 03/12/16 0607 03/13/16 0534  WBC 11.8* 12.1* 11.2* 10.2 9.7  NEUTROABS 9.6* 9.7* 9.2* 7.9* 7.7  HGB 13.4 13.7 13.4 13.1 13.3   HCT 40.4 41.6 40.1 39.4 39.1  MCV 102.0* 100.7* 98.8 99.2 97.3  PLT 225 221 197 190 0000000   Basic Metabolic Panel:  Recent Labs Lab 03/10/16 0626 03/11/16 0516 03/12/16 0607 03/13/16 0534 03/14/16 0518  NA 141 140 139 137 136  K 4.3 5.5* 4.8 4.4 4.3  CL 105 105 103 100* 97*  CO2 28 26 25 26 28   GLUCOSE 108* 127* 139* 142* 142*  BUN 43* 39* 44* 49* 57*  CREATININE 1.36* 1.25* 1.48* 1.65* 1.70*  CALCIUM 7.9* 7.9* 8.0* 7.9* 7.8*  MG  --  2.6* 2.5*  --   --   PHOS  --  4.5 4.5  --   --    GFR: Estimated Creatinine Clearance: 46.3 mL/min (by C-G formula based on SCr of 1.7 mg/dL (H)).  CBG:  Recent Labs Lab 03/14/16 1703 03/14/16 2104 03/15/16 0733 03/15/16 1658 03/15/16 2208  GLUCAP 127* 114* 124* 123* 132*    No results found for this or any previous visit (from the past 240 hour(s)).    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 He was recently admitted with small bowel obstruction secondary to peritoneal carcinomatosis in addition to acute kidney injury, nausea, vomiting, weakness  and malnutrition. He did not tolerate NG tube placement well. Request now received from surgery for palliative decompressive gastrostomy tube placement. Patient most likely has multiple partial obstructions to his small bowel from carcinomatosis EXAM: PERC PLACEMENT GASTROSTOMY FLUOROSCOPY TIME:  4 minutes (1051 uGym2 DAP) TECHNIQUE: The procedure, risks, benefits, and alternatives were explained to the patient and family. Questions regarding the procedure were encouraged and answered. The patient understands and consents to the procedure. As antibiotic prophylaxis, cefazolin 2 g was ordered pre-procedure and administered intravenously within one hour of incision. A safe percutaneous approach to the stomach was confirmed on recent CT abdomen. Preprocedure paracenteses  performed through a previously placed tunneled catheter. Intravenous Fentanyl and Versed were administered as conscious sedation during continuous monitoring of the patient's level of consciousness and physiological / cardiorespiratory status by the radiology RN, with a total moderate sedation time of 35 minutes. A 5 French angiographic catheter was placed as orogastric tube. The upper abdomen was prepped with Betadine, draped in usual sterile fashion, and infiltrated locally with 1% lidocaine. Stomach was insufflated using air through the orogastric tube. A large amount of residual fluid was noted distending the stomach, so the OG tube was exchanged over an Amplatz wire for a long 12 French pigtail catheter, advanced into the stomach to allow aspiration of 1.5 L of gastric contents. The catheter was then exchanged back for the 5 French angiographic catheter. Stomach was insufflated through the orogastric catheter. An 5 French sheath needle was advanced percutaneously into the gastric lumen under fluoroscopy. Gas could be aspirated and a small contrast injection confirmed intraluminal spread. The sheath was exchanged over a guidewire for a 9 [place] vascular sheath, through which the snare device was advanced and used to snare a guidewire passed through the orogastric tube. This was withdrawn, and the snare attached to the 20 French pull-through gastrostomy tube, which was advanced antegrade, positioned with the internal bumper securing the anterior gastric wall to the anterior abdominal wall. Small contrast injection confirms appropriate positioning. The external bumper was applied and the catheter was flushed. COMPLICATIONS: COMPLICATIONS none IMPRESSION: 1. Technically successful 20 French pull-through gastrostomy placement under fluoroscopy. Electronically Signed   By: Lucrezia Europe M.D.   On: 03/14/2016 13:55   Dg Abd 2 Views  Result Date: 03/14/2016 CLINICAL DATA:  Follow-up small bowel obstruction. EXAM:  ABDOMEN - 2 VIEW COMPARISON:  03/08/2016 FINDINGS: Tunneled ascites catheter with tip at the left mid abdomen and plastic biliary stent. Partly seen porta catheter on the right. Dilated loops of small bowel in the right lower quadrant and moderate distension of the stomach, similar to prior. Moderate volume of formed stool in the left colon. No rectal impaction. No evidence of pneumoperitoneum. IMPRESSION: Partial small bowel obstruction with distended stomach and right lower quadrant small bowel loops. Bowel gas pattern is similar to 6 days ago. Electronically Signed   By: Monte Fantasia M.D.   On: 03/14/2016 08:34    Scheduled Meds: . famotidine (PEPCID) IV  20 mg Intravenous Q12H  . feeding supplement  1 Container Oral TID BM  . feeding supplement (ENSURE ENLIVE)  237 mL Oral TID BM  . metoCLOPramide  5 mg Oral TID AC   Continuous Infusions: . lactated ringers 1,000 mL infusion 125 mL/hr at 03/15/16 0630     LOS: 9 days    Time spent: 25 minutes   Barton Dubois, MD [phone] [facility] [email or web address] Password [facility] 03/15/2016, 10:37 PM

## 2016-03-16 ENCOUNTER — Encounter (HOSPITAL_COMMUNITY): Payer: Self-pay

## 2016-03-16 DIAGNOSIS — E875 Hyperkalemia: Secondary | ICD-10-CM

## 2016-03-16 DIAGNOSIS — N183 Chronic kidney disease, stage 3 unspecified: Secondary | ICD-10-CM

## 2016-03-16 LAB — GLUCOSE, CAPILLARY: Glucose-Capillary: 121 mg/dL — ABNORMAL HIGH (ref 65–99)

## 2016-03-16 MED ORDER — LACTULOSE 20 GM/30ML PO SOLN
30.0000 mL | Freq: Every day | ORAL | 1 refills | Status: AC
Start: 2016-03-16 — End: ?

## 2016-03-16 MED ORDER — ALUM & MAG HYDROXIDE-SIMETH 200-200-20 MG/5ML PO SUSP: 30.0000 mL | ORAL | 0 refills | Status: AC | PRN

## 2016-03-16 MED ORDER — ENSURE ENLIVE PO LIQD
237.0000 mL | Freq: Two times a day (BID) | ORAL | Status: AC
Start: 2016-03-16 — End: ?

## 2016-03-16 MED ORDER — ZOLPIDEM TARTRATE 5 MG PO TABS: 5.0000 mg | ORAL_TABLET | Freq: Every evening | ORAL | 0 refills | Status: AC | PRN

## 2016-03-16 MED ORDER — METOCLOPRAMIDE HCL 5 MG/5ML PO SOLN: 5.0000 mg | Freq: Three times a day (TID) | ORAL | 0 refills | Status: AC

## 2016-03-16 MED ORDER — LORAZEPAM 2 MG/ML PO CONC: 1.0000 mg | ORAL | 0 refills | Status: AC | PRN

## 2016-03-16 MED ORDER — MORPHINE SULFATE (CONCENTRATE) 10 MG /0.5 ML PO SOLN: 10.0000 mg | ORAL | 0 refills | Status: AC | PRN

## 2016-03-16 NOTE — Discharge Summary (Signed)
Matthew Waller [id number] DOB: 09-24-1945 DOA: 03/06/2016  PCP: [name], MD  Admit date: 03/06/2016 Discharge date: 03/16/2016  Time spent: 35 minutes  Recommendations for Outpatient Follow-up:  1. Full comfort care 2. No further hospitalizations if possible 3. 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 Patient to be discharge home with hospice arrangements.   Diet recommendation: comfort feeding   Filed Weights   03/06/16 2206 03/15/16 1108  Weight: 104.3 kg (230 lb) 88.4 kg (194 lb 14.4 oz)    History of present illness:  As per H&P written by Dr. [name] on 03/06/16 71 y.o.malewith recently diagnosed cholangiocarcinoma status postbiliarystent placement and has had Pleurx catheter placed for the malignant ascites and also have Port-A-Cath placed yesterday. Patient presented to the ER because of persistent nausea vomiting over the last 24 hours. Patient also has been having abdominal discomfort. Patient's last bowel movement was almost a week ago. CT scan of the abdomen shows features concerning for small bowel obstruction with transition point in the distal ileum with possible peritoneal carcinomatosis. On-call general surgeon Dr. Jari Favre consulted. Patient is being admitted for small bowel obstruction with known history of recent diagnosis of cholangiocarcinoma.  Hospital Course:  SBO in setting  of cholangiocarcinoma with peritoneal carcinomatosis, malignant ascites  -Peritoneal Pleurx catheter and right IJ port placed by IR as outpatient on1/31  -appreciate Oncology and CCS Assistance/rec's -Appreciate palliative care inputs and rec's. After further [place] discussion, patient now DNR and looking to pursuit symptomatic management and comfort care. -PleurX accessed on 2/2 with 1L peritoneal fluid removed, and again done on 2/6 and 2/8 with anothet Liter removed each day. -planning drainage as needed base on patient symptoms and accumulation rate -Patient with ongoing nausea intermittently, but no further vomitingtoday, continue moving his bowels (loose) -will continue reglan TID -continue PRN antiemetics -Abd x-ray (2/8) still with persistent partial SBO features -IR consulted for venting gastric tube placement on 03/14/16 -after further discussion plan is for full comfort care and symptoms management  -hospice arranged for home services.  Hyperkalemia -No EKG without any acute ST-T changes -Resolved  -will no follow or recommend further labwork; plans is for comfort/symptomatic management   CKD stage 3 -Stable overall -patient not drinking a lot currently and losing fluids with paracentesis (intermittently) and now also with venting process (from gastrostomy tube). -Cr trended up some (last time checked) -now no further lab draws anticipated; focus on comfort/symptomatic management only.  Severe malnutrition in context of acute illness/injury  -After further GOC discussion and irreversibility of his condition; will pursuit comfort feeding and symptomatic management only. -hospice at home has been arranged  Procedures:  See below for x-ray reports   Gastrostomy venting tube placed on 03/14/16  Consultations:  CCS  Oncology  IR  Palliative Care  Discharge Exam:     Vitals:   03/15/16 2035 03/16/16 0523  BP: 131/74 123/75  Pulse: 86 88  Resp: 18 18  Temp:  99  F (37.2 C) 98.8 F (37.1 C)   General exam:Afebrile, reports feeling weak, tired and listless. Patient reports feeling ok today; experiencing mild intermittent nausea. No further vomiting and no hiccups. Having some difficulty sleeping. Respiratory system: Clear to auscultation. Respiratory effort normal. Cardiovascular system:S1 &S2 heard, RRR. No JVD, murmurs, rubs, gallops or clicks. No pedal edema. Gastrointestinal system:Abdomen is a lot less distended and softer. Had 1L removed yesterday through PleurX in place, reduce Bowel sound (but present). Now also with gastrostomy venting tube in place. Central nervous system:Alert and oriented. No focal neurological deficits. Extremities: Symmetric 4-5 x 5 power due to poor effort and overall deconditioning from chronic illness. Skin: No rashes, lesions or ulcers appreciated Psychiatry:Judgement and insight appear normal. Mood &affect appropriate.    Discharge Instructions       Discharge Instructions    Discharge instructions    Complete by:  As directed    Full comfort care and symptomatic management Follow instructions and recommendations from hospice nurse Comfort feeding         Current Discharge Medication List        START taking these medications   Details  alum & mag hydroxide-simeth (MAALOX/MYLANTA) 200-200-20 MG/5ML suspension Take 30 mLs by mouth as needed for indigestion or heartburn. Qty: 355 mL, Refills: 0    feeding supplement, ENSURE ENLIVE, (ENSURE ENLIVE) LIQD Take 237 mLs by mouth 2 (two) times daily between meals. For comfort feeding    LORazepam (ATIVAN) 2 MG/ML concentrated solution Take 0.5 mLs (1 mg total) by mouth every 4 (four) hours as needed for anxiety or sleep (agitation). Qty: 45 mL, Refills: 0    metoCLOPramide (REGLAN) 5 MG/5ML solution Take 5 mLs (5 mg total) by mouth 3 (three) times daily before meals. Qty: 120 mL, Refills: 0    Morphine Sulfate (MORPHINE  CONCENTRATE) 10 mg / 0.5 ml concentrated solution Take 0.5 mLs (10 mg total) by mouth every 3 (three) hours as needed for moderate pain, severe pain or shortness of breath (comfort). Qty: 30 mL, Refills: 0          CONTINUE these medications which have CHANGED   Details  Lactulose 20 GM/30ML SOLN Take 30 mLs (20 g total) by mouth daily. Hold for severe diarrhea Qty: 473 mL, Refills: 1    zolpidem (AMBIEN) 5 MG tablet Take 1 tablet (5 mg total) by mouth at bedtime as needed for sleep. Qty: 20 tablet, Refills: 0          CONTINUE these medications which have NOT CHANGED   Details  !! Carboxymethylcell-Hypromellose (GENTEAL OP) Place 1 drop into both eyes daily.    !! Carboxymethylcell-Hypromellose (GENTEAL) 0.25-0.3 % GEL Place 1 drop into both eyes at bedtime.     !! - Potential duplicate medications found. Please discuss with provider.       STOP taking these medications     magnesium citrate SOLN      mirtazapine (REMERON) 15 MG tablet      oxyCODONE (ROXICODONE) 5 MG immediate release tablet      polyethylene glycol (MIRALAX / GLYCOLAX) packet      Wheat Dextrin (BENEFIBER DRINK MIX PO)      Ginger, Zingiber officinalis, (GINGER ROOT) 550 MG CAPS      GLUCOSAMINE-CHONDROITIN PO      loratadine (CLARITIN) 10 MG tablet      Omega-3 Fatty Acids (FISH OIL) 1000 MG CAPS      OVER THE COUNTER MEDICATION  No Known Allergies   The results of significant diagnostics from this hospitalization (including imaging, microbiology, ancillary and laboratory) are listed below for reference.    Significant Diagnostic Studies:  Imaging Results  Ct Abdomen Pelvis W Contrast  Result Date: 03/06/2016 CLINICAL DATA:  Vomiting for 2 days.  Abdominal distention. EXAM: CT ABDOMEN AND PELVIS WITH CONTRAST TECHNIQUE: Multidetector CT imaging of the abdomen and pelvis was performed using the standard protocol following bolus  administration of intravenous contrast. CONTRAST:  146mL ISOVUE-300 IOPAMIDOL (ISOVUE-300) INJECTION 61% COMPARISON:  03/01/2016, 02/26/2016 FINDINGS: Lower chest: Mild linear scarring or atelectasis in the bases. No lung base nodules or consolidation. Trace left pleural effusion. Hepatobiliary: Biliary stent is satisfactorily positioned. No hepatic parenchymal masses. Pneumobilia, related to the stent. Slight residual biliary prominence, but decreased from the pre-stent study. Pancreas: Unremarkable. No pancreatic ductal dilatation or surrounding inflammatory changes. Spleen: Normal in size without focal abnormality. Adrenals/Urinary Tract: Adrenal glands are unremarkable. Kidneys are normal, without renal calculi, focal lesion, or hydronephrosis. Bladder is unremarkable. Stomach/Bowel: Marked dilatation of stomach and small bowel, new from 02/26/2016. Transition to decompressed distal ileum occurs in the right lower quadrant, axial images 46- 53 series 2. The bowel appears somewhat matted together in this area, without definition of a discrete mass. There is diffuse peritoneal enhancement and this is increased from 02/26/2016. There is moderate volume peritoneal ascites. There is diffuse stranding infiltrative opacity in the omentum. These findings most likely represent peritoneal carcinomatosis. The increased degree of peritoneal enhancement and bowel wall enhancement is fairly striking compared to 02/26/2016, suggesting rapid disease progression. Less likely possibility of infectious peritonitis not entirely excluded. Percutaneous peritoneal drainage catheter is visible anteriorly, appearing satisfactorily positioned. Vascular/Lymphatic: Aortic atherosclerosis. No enlarged abdominal or pelvic lymph nodes. Reproductive: Unremarkable Other: No extraluminal air to suggest bowel perforation. Musculoskeletal: No significant skeletal lesions. IMPRESSION: 1. New marked dilatation of stomach and small bowel with  transition to decompressed distal ileum in the right lower quadrant. Probable small bowel obstruction. A discrete mass is not visible at the transition point, but the small bowel appears somewhat matted together. 2. 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 This is worsened from 02/26/2016, suggesting rapid progression of disease. Less likely possibility of infectious peritonitis cannot be excluded. 3. Biliary stent and peritoneal drainage catheter appear satisfactorily positioned. Electronically Signed   By: Andreas Newport M.D.   On: 03/06/2016 19:01   Ct Abdomen Pelvis W Contrast  Result Date: 02/26/2016 CLINICAL DATA:  Right-sided abdominal pain. EXAM: CT ABDOMEN AND PELVIS WITH CONTRAST TECHNIQUE: Multidetector CT imaging of the abdomen and pelvis was performed using the standard protocol following bolus administration of intravenous contrast. CONTRAST:  100 mL of Isovue-300 intravenously. COMPARISON:  CT scan of Jun 16, 2003. FINDINGS: Lower chest: Mild bilateral posterior basilar subsegmental atelectasis is noted. Hepatobiliary: No gallstones are noted. Mild intrahepatic and extrahepatic biliary dilatation is noted. No focal mass or lesion is noted in the liver. The liver does appear to be slightly moved smaller with slightly nodular contours suggesting hepatic cirrhosis. Pancreas: Unremarkable. No pancreatic ductal dilatation or surrounding inflammatory changes. Spleen: Normal in size without focal abnormality. Adrenals/Urinary Tract: Adrenal glands are unremarkable. Kidneys are normal, without renal calculi, focal lesion, or hydronephrosis. Bladder is unremarkable. Stomach/Bowel: There is no evidence of bowel obstruction. Vascular/Lymphatic: No significant vascular findings are present. No enlarged abdominal or pelvic lymph nodes. Reproductive: Mild prostatic enlargement is noted with associated  calcification. Other:  Moderate ascites is noted. Irregular thickening is seen involving the omentum ; is uncertain if this represents inflammation or edema, or possibly carcinomatosis. Small left fat containing inguinal hernia is noted. Musculoskeletal: No acute or significant osseous findings. IMPRESSION: Mild intrahepatic and extrahepatic biliary dilatation is noted. Correlation with liver function tests is recommended to rule out obstruction. Findings are suggestive of possible hepatic cirrhosis. Moderate ascites is noted. Irregular thickening is seen involving the omentum ; it is uncertain if this represents inflammation or edema, or possibly carcinomatosis. Clinical correlation is recommended. Electronically Signed   By: Marijo Conception, M.D.   On: 02/26/2016 16:33   Ir Gastrostomy Tube Mod Sed  Result Date: 03/14/2016 CLINICAL DATA:  stage IV metastatic cholangiocarcinoma to the peritoneum with malignant ascites. He was recently admitted with small bowel obstruction secondary to peritoneal carcinomatosis in addition to acute kidney injury, nausea, vomiting, weakness and malnutrition. He did not tolerate NG tube placement well. Request now received from surgery for palliative decompressive gastrostomy tube placement. Patient most likely has multiple partial obstructions to his small bowel from carcinomatosis EXAM: PERC PLACEMENT GASTROSTOMY FLUOROSCOPY TIME:  4 minutes (1051 uGym2 DAP) TECHNIQUE: The procedure, risks, benefits, and alternatives were explained to the patient and family. Questions regarding the procedure were encouraged and answered. The patient understands and consents to the procedure. As antibiotic prophylaxis, cefazolin 2 g was ordered pre-procedure and administered intravenously within one hour of incision. A safe percutaneous approach to the stomach was confirmed on recent CT abdomen. Preprocedure paracenteses performed through a previously placed tunneled catheter. Intravenous  Fentanyl and Versed were administered as conscious sedation during continuous monitoring of the patient's level of consciousness and physiological / cardiorespiratory status by the radiology RN, with a total moderate sedation time of 35 minutes. A 5 French angiographic catheter was placed as orogastric tube. The upper abdomen was prepped with Betadine, draped in usual sterile fashion, and infiltrated locally with 1% lidocaine. Stomach was insufflated using air through the orogastric tube. A large amount of residual fluid was noted distending the stomach, so the OG tube was exchanged over an Amplatz wire for a long 12 French pigtail catheter, advanced into the stomach to allow aspiration of 1.5 L of gastric contents. The catheter was then exchanged back for the 5 French angiographic catheter. Stomach was insufflated through the orogastric catheter. An 39 French sheath needle was advanced percutaneously into the gastric lumen under fluoroscopy. Gas could be aspirated and a small contrast injection confirmed intraluminal spread. The sheath was exchanged over a guidewire for a 9 [place] vascular sheath, through which the snare device was advanced and used to snare a guidewire passed through the orogastric tube. This was withdrawn, and the snare attached to the 20 French pull-through gastrostomy tube, which was advanced antegrade, positioned with the internal bumper securing the anterior gastric wall to the anterior abdominal wall. Small contrast injection confirms appropriate positioning. The external bumper was applied and the catheter was flushed. COMPLICATIONS: COMPLICATIONS none IMPRESSION: 1. Technically successful 20 French pull-through gastrostomy placement under fluoroscopy. Electronically Signed   By: Lucrezia Europe M.D.   On: 03/14/2016 13:55   US Paracentesis  Result Date: 02/29/2016 INDICATION: Ascites of unknown etiology. Request is made for therapeutic paracentesis. EXAM: ULTRASOUND GUIDED THERAPEUTIC  PARACENTESIS MEDICATIONS: 1% lidocaine. COMPLICATIONS: None immediate. PROCEDURE: Informed written consent was obtained from the patient after a discussion of the risks, benefits and alternatives to treatment. A timeout was performed prior to the initiation of the procedure. Initial ultrasound  scanning demonstrates a small amount of ascites within the left upper abdominal quadrant. The left upper abdomen was prepped and draped in the usual sterile fashion. 1% lidocaine was used for local anesthesia. Following this, a 19 gauge, 7-cm, Yueh catheter was introduced. An ultrasound image was saved for documentation purposes. The paracentesis was performed. The catheter was removed and a dressing was applied. The patient tolerated the procedure well without immediate post procedural complication. FINDINGS: A total of approximately 2.9 L of yellow fluid was removed. IMPRESSION: Successful ultrasound-guided paracentesis yielding 2.9 liters of peritoneal fluid. Read by: Saverio Danker, PA-C Electronically Signed   By: Aletta Edouard M.D.   On: 02/29/2016 13:54   US Paracentesis  Result Date: 02/27/2016 INDICATION: Should with right upper quadrant abdominal pain for 2 weeks. Recent imaging shows possible cirrhosis with moderate ascites. Request is made for diagnostic and therapeutic paracentesis. EXAM: ULTRASOUND GUIDED DIAGNOSTIC AND THERAPEUTIC PARACENTESIS MEDICATIONS: 1% lidocaine COMPLICATIONS: None immediate. PROCEDURE: Informed written consent was obtained from the patient after a discussion of the risks, benefits and alternatives to treatment. A timeout was performed prior to the initiation of the procedure. Initial ultrasound scanning demonstrates a small amount of ascites within the left lower abdominal quadrant. The left lower abdomen was prepped and draped in the usual sterile fashion. 1% lidocaine was used for local anesthesia. Following this, a 19 gauge, 7-cm, Yueh catheter was introduced. An ultrasound  image was saved for documentation purposes. The paracentesis was performed. The catheter was removed and a dressing was applied. The patient tolerated the procedure well without immediate post procedural complication. FINDINGS: A total of approximately 3.7 L of yellow fluid was removed. Samples were sent to the laboratory as requested by the clinical team. IMPRESSION: Successful ultrasound-guided paracentesis yielding 3.7 liters of peritoneal fluid. Read by: Saverio Danker, PA-C Electronically Signed   By: Jerilynn Mages.  Shick M.D.   On: 02/27/2016 12:08   Ir US Guide Vasc Access Right  Result Date: 03/06/2016 INDICATION: 71 year old with metastatic adenocarcinoma and malignant ascites. Patient needs a Port-A-Cath for chemotherapy and a tunneled peritoneal catheter for refractory symptomatic ascites. EXAM: FLUOROSCOPIC AND ULTRASOUND GUIDED PLACEMENT OF A SUBCUTANEOUS PORT IMAGE GUIDED PLACEMENT OF TUNNELED PERITONEAL CATHETER COMPARISON:  None. MEDICATIONS: Ancef 2 g; The antibiotic was administered within an appropriate time interval prior to skin puncture. ANESTHESIA/SEDATION: Versed 2.5 mg IV; Fentanyl 125 mcg IV; Moderate Sedation Time:  65 minutes The patient was continuously monitored during the procedure by the interventional radiology nurse under my direct supervision. FLUOROSCOPY TIME:  36 seconds, 3 mGy COMPLICATIONS: None immediate. PROCEDURE: The procedure, risks, benefits, and alternatives were explained to the patient. Questions regarding the procedure were encouraged and answered. The patient understands and consents to the procedure. Patient was placed supine on the interventional table. Ultrasound confirmed a patent right internal jugular vein. The right chest and neck were cleaned with a skin antiseptic and a sterile drape was placed. Maximal barrier sterile technique was utilized including caps, mask, sterile gowns, sterile gloves, sterile drape, hand hygiene and skin antiseptic. The right neck was  anesthetized with 1% lidocaine. Small incision was made in the right neck with a blade. Micropuncture set was placed in the right internal jugular vein with ultrasound guidance. The micropuncture wire was used for measurement purposes. The right chest was anesthetized with 1% lidocaine with epinephrine. #15 blade was used to make an incision and a subcutaneous port pocket was formed. [place] was assembled. Subcutaneous tunnel was formed with a stiff  tunneling device. The port catheter was brought through the subcutaneous tunnel. The port was placed in the subcutaneous pocket. The micropuncture set was exchanged for a peel-away sheath. The catheter was placed through the peel-away sheath and the tip was positioned at the superior cavoatrial junction. Catheter placement was confirmed with fluoroscopy. The port was accessed and flushed with heparinized saline. The port pocket was closed using two layers of absorbable sutures and Dermabond. The vein skin site was closed using a single layer of absorbable suture and Dermabond. Sterile dressings were applied. Patient tolerated the procedure well without an immediate complication. Ultrasound and fluoroscopic images were taken and saved for this procedure. Attention was directed to the peritoneal catheter placement. The right side of the abdomen was prepped and draped in sterile fashion. Ultrasound was used to confirm an adequate pocket in the right lateral abdomen. Skin was anesthetized with 1% lidocaine. Needle was directed into the peritoneal space with ultrasound guidance and clear yellow fluid was aspirated. A wire was advanced into the peritoneal cavity. The skin anterior to the peritoneal entry site was anesthetized with 1% lidocaine. Small incision was made. A subcutaneous tract was created between the 2 incisions. The PleurX peritoneal catheter was placed through the tunnel and the cuff was placed underneath the skin. A peel-away sheath was placed over  the peritoneal wire and the PleurX catheter was placed through the peel-away sheath. Catheter directed to the left side of the abdomen. Catheter was attached to suction canister and clear yellow fluid was easily draining. Peritoneal entry site was closed using absorbable suture and skin glue. Catheter was sutured to skin with Prolene suture. Dressing was placed over the incisions. Fluoroscopic and ultrasound images were taken and saved for documentation. IMPRESSION: Placement of a subcutaneous port device. Catheter tip in the superior cavoatrial junction. Successful placement of a tunneled peritoneal catheter with image guidance. Electronically Signed   By: Markus Daft M.D.   On: 03/06/2016 14:24   Ct Biopsy  Result Date: 03/01/2016 INDICATION: MALIGNANT ASCITES, PERITONEAL CARCINOMATOSIS BY CT, UNKNOWN PRIMARY EXAM: CT BIOPSY MEDICATIONS: 1% lidocaine locally ANESTHESIA/SEDATION: Moderate (conscious) sedation was employed during this procedure. A total of Versed 1.0 mg and Fentanyl 75 mcg was administered intravenously. Moderate Sedation Time: 6 minutes. The patient's level of consciousness and vital signs were monitored continuously by radiology nursing throughout the procedure under my direct supervision. FLUOROSCOPY TIME:  Fluoroscopy Time: None. COMPLICATIONS: None immediate. PROCEDURE: Informed written consent was obtained from the patient after a thorough discussion of the procedural risks, benefits and alternatives. All questions were addressed. Maximal Sterile Barrier Technique was utilized including caps, mask, sterile gowns, sterile gloves, sterile drape, hand hygiene and skin antiseptic. A timeout was performed prior to the initiation of the procedure. Previous imaging reviewed. Patient positioned supine. Noncontrast localization CT performed. The strandy fine nodularity in the omentum was localized in the left abdomen. Overlying skin was marked. Under sterile conditions and local anesthesia, a 17  gauge 6.8 cm access needle was advanced percutaneously from an anterior approach into the abnormal left anterior omentum. Needle position confirmed with CT. 18 gauge core biopsies obtained. Samples placed in formalin. Needle removed. Postprocedure imaging demonstrates no hemorrhage or hematoma. Patient tolerated the biopsy well. IMPRESSION: Successful CT-guided left omental biopsy as above. Electronically Signed   By: Jerilynn Mages.  Shick M.D.   On: 03/01/2016 14:29   Dg Chest Port 1 View  Result Date: 02/26/2016 CLINICAL DATA:  Right middle flank pain. Abdominal distension with nausea, no vomiting. EXAM:  PORTABLE CHEST 1 VIEW COMPARISON:  None. FINDINGS: 1514 hours. The heart size and mediastinal contours are normal. There is mild left-greater-than-right basilar atelectasis. No edema, confluent airspace opacity or significant pleural effusion. The bones appear unremarkable. IMPRESSION: Mild bibasilar atelectasis.  No acute cardiopulmonary process. Electronically Signed   By: Richardean Sale M.D.   On: 02/26/2016 15:29   Dg Ercp Biliary & Pancreatic Ducts  Result Date: 02/28/2016 CLINICAL DATA:  Bile duct stone.  History of right abdominal pain. EXAM: ERCP TECHNIQUE: Multiple spot images obtained with the fluoroscopic device and submitted for interpretation post-procedure. FLUOROSCOPY TIME:  Fluoroscopy Time:  2 minutes and 42 seconds Number of Acquired Spot Images: 7 COMPARISON:  Abdominal CT 02/26/2016 FINDINGS: Cannulation and opacification of the common bile duct. Wire was advanced into the intrahepatic bile ducts. There is a focal narrowing or stricture in the proximal extrahepatic bile duct which is probably the common hepatic duct. Bile ducts are dilated proximal to this narrowing. A nonmetallic biliary stent was placed across the narrowing. IMPRESSION: Focal narrowing in the proximal extrahepatic biliary system, probably involving the common hepatic duct. Placement of biliary stent. These images were  submitted for radiologic interpretation only. Please see the procedural report for the amount of contrast and the fluoroscopy time utilized. Electronically Signed   By: Markus Daft M.D.   On: 02/28/2016 15:18   Dg Abd 2 Views  Result Date: 03/14/2016 CLINICAL DATA:  Follow-up small bowel obstruction. EXAM: ABDOMEN - 2 VIEW COMPARISON:  03/08/2016 FINDINGS: Tunneled ascites catheter with tip at the left mid abdomen and plastic biliary stent. Partly seen porta catheter on the right. Dilated loops of small bowel in the right lower quadrant and moderate distension of the stomach, similar to prior. Moderate volume of formed stool in the left colon. No rectal impaction. No evidence of pneumoperitoneum. IMPRESSION: Partial small bowel obstruction with distended stomach and right lower quadrant small bowel loops. Bowel gas pattern is similar to 6 days ago. Electronically Signed   By: Monte Fantasia M.D.   On: 03/14/2016 08:34   Dg Abd Portable 1v-small Bowel Obstruction Protocol-initial, 8 Hr Delay  Result Date: 03/08/2016 CLINICAL DATA:  Small bowel obstruction, 8 hour follow-up. EXAM: PORTABLE ABDOMEN - 1 VIEW COMPARISON:  CT abdomen and pelvis March 06, 2016 FINDINGS: Contrast within proximal small bowel which is dilated to 5 cm. Gas distended small bowel RIGHT lower quadrant measuring up to 4.5 cm. Surgical drain upper abdomen. Biliary stent. No intra-abdominal mass effect or pathologic calcifications. Soft tissue planes and included osseous structures are nonsuspicious. IMPRESSION: Small bowel obstruction, limited contrast transit to proximal small bowel dilated at 5 cm. Electronically Signed   By: Elon Alas M.D.   On: 03/08/2016 05:30   Dg [place]ce Dys Tube Plc W/fl W/rad  Result Date: 03/08/2016 CLINICAL DATA:  NG tube placement EXAM: NASO G TUBE PLACEMENT WITH FL AND WITH RAD FLUOROSCOPY TIME:  Fluoroscopy Time:  2 minutes 2 seconds Radiation Exposure Index (if provided by the fluoroscopic  device): 8.9 mGy Number of Acquired Spot Images: 0 COMPARISON:  None. FINDINGS: NG tube was placed under fluoroscopic guidance, with the tip and side-port in the mid stomach. IMPRESSION: NG tube placed into the stomach. Electronically Signed   By: Rolm Baptise M.D.   On: 03/08/2016 15:23   Dg [place]ison Bailey G Tube Plc W/fl W/rad  Result Date: 03/07/2016 CLINICAL DATA:  Bowel obstruction with nausea. Therapeutic decompression. EXAM: NASO G TUBE PLACEMENT WITH FL AND WITH RAD FLUOROSCOPY TIME:  Fluoroscopy Time:  2 minutes 18 seconds Radiation Exposure Index (if provided by the fluoroscopic device): 19 mGy Number of Acquired Spot Images: 0 COMPARISON:  None. FINDINGS: The nose was anesthetized with viscous lidocaine. A nasogastric tube was inserted through the right nostril and easily advanced to the GE junction. At this point, there was resistance to tube passage. Attempt was made to pass a soft tip wire through the tube, but the wire tip preferentially track through side-port. Ultimately, the patient was sat up, and the tube was easily advanced and positioned in the stomach. Copious succus was aspirated. No indication of complication. IMPRESSION: Successful nasogastric tube placement. Electronically Signed   By: Monte Fantasia M.D.   On: 03/07/2016 17:27   Mr Abdomen Mrcp Moise Boring Contast  Result Date: 02/29/2016 CLINICAL DATA:  Abdominal pain/ distension with abnormal liver enzymes, recent ERCP showing stricture in the common hepatic duct across which a stent was placed. Hepatic morphology raises suspicion for cirrhosis. Ascites with infiltrative pattern in the omentum. EXAM: MRI ABDOMEN WITHOUT AND WITH CONTRAST (INCLUDING MRCP) TECHNIQUE: Multiplanar multisequence MR imaging of the abdomen was performed both before and after the administration of intravenous contrast. Heavily T2-weighted images of the biliary and pancreatic ducts were obtained, and three-dimensional MRCP images were rendered by post processing.  CONTRAST:  20 cc MultiHance COMPARISON:  02/26/2016 FINDINGS: Lower chest: Atelectasis in the left lower lobe with subsegmental atelectasis in the right lower lobe. Small hiatal hernia. Hepatobiliary: Mildly nodular contour the liver with morphology suggesting cirrhosis. Contracted and somewhat thick-walled gallbladder. Periportal edema. The typical 3D time-of-flight MRCP sequence was nondiagnostic due to motion artifact despite numerous attempts. Based on the other diagnostic sequences, there is minimal intrahepatic biliary dilatation extending to a approximately 1.7 cm region of accentuated enhancement within or surrounding the common hepatic duct. There is believed to be a stent traversing this region of stricture although admittedly the stent is difficult to visualize on most sequences, but its presence is suggested on image 21 of series 4. On arterial phase images there is some faintly accentuated enhancement along the margins of the gallbladder fossa. Mild heterogeneity of enhancement in the caudate lobe but without a well-defined mass. My impression is that there is low-grade enhancement along the strictured segment of the common hepatic duct and extending into some of the intrahepatic bile ducts particularly in the left hepatic lobe and anteriorly in the right hepatic lobe, for example on images 54 through 71 of series 11404. For the most part this extends along the walls of the bile ducts rather than forming a single large mass. Mild enhancement of the distal CBD wall approaching the ampulla for example on image 94/11404. Pancreas: Mildly obscured by motion artifact. 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 No mass observed. Spleen:  Unremarkable Adrenals/Urinary Tract: Adrenal glands normal. Kidneys unremarkable. Stomach/Bowel: Unremarkable Vascular/Lymphatic: Patent celiac trunk, SMA, and single bilateral renal arteries. No pathologic adenopathy observed. Other: Upper abdominal ascites with irregular fluid  infiltration of the omentum. This demonstrates abnormal reticular enhancement, and there is also thin enhancement along the peritoneal margins of the ascites. Musculoskeletal: Unremarkable IMPRESSION: 1. Along the intrahepatic bile ducts and common hepatic duct, there is abnormal enhancement of the bile duct walls. There is also some CBD enhancement near the ampulla. The appearance is abnormal and could be a manifestation of cholangitis, but given the focal stricture on prior MRCP also raises concern for cholangiocarcinoma. This is not so much masslike as potentially infiltrative along the bile duct walls. Unfortunately, despite  numerous attempts, the 3D time-of-flight MRCP images could not be obtained for further spatial resolution assessment of the biliary tree. A stent is in place and there is only minimal intrahepatic biliary dilatation. 2. Ascites. Unusual nodular infiltration of the omentum with reticular associated enhancement, potentially from neoplastic involvement or inflammation of the omentum. There is subtle diffuse enhancement along the peritoneal margins of the ascites. 3. Small hiatal hernia. 4. Pancreas divisum. Electronically Signed   By: Van Clines M.D.   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 Patient needs a Port-A-Cath for chemotherapy and a tunneled peritoneal catheter for refractory symptomatic ascites. EXAM: FLUOROSCOPIC AND ULTRASOUND GUIDED PLACEMENT OF A SUBCUTANEOUS PORT IMAGE GUIDED PLACEMENT OF TUNNELED PERITONEAL CATHETER COMPARISON:  None. MEDICATIONS: Ancef 2 g; The antibiotic was administered within an appropriate time interval prior to skin puncture. ANESTHESIA/SEDATION: Versed 2.5 mg IV; Fentanyl 125 mcg IV; Moderate Sedation Time:  65 minutes The patient was continuously monitored during the procedure by the  interventional radiology nurse under my direct supervision. FLUOROSCOPY TIME:  36 seconds, 3 mGy COMPLICATIONS: None immediate. PROCEDURE: The procedure, risks, benefits, and alternatives were explained to the patient. Questions regarding the procedure were encouraged and answered. The patient understands and consents to the procedure. Patient was placed supine on the interventional table. Ultrasound confirmed a patent right internal jugular vein. The right chest and neck were cleaned with a skin antiseptic and a sterile drape was placed. Maximal barrier sterile technique was utilized including caps, mask, sterile gowns, sterile gloves, sterile drape, hand hygiene and skin antiseptic. The right neck was anesthetized with 1% lidocaine. Small incision was made in the right neck with a blade. Micropuncture set was placed in the right internal jugular vein with ultrasound guidance. The micropuncture wire was used for measurement purposes. The right chest was anesthetized with 1% lidocaine with epinephrine. #15 blade was used to make an incision and a subcutaneous port pocket was formed. [place] was assembled. Subcutaneous tunnel was formed with a stiff tunneling device. The port catheter was brought through the subcutaneous tunnel. The port was placed in the subcutaneous pocket. The micropuncture set was exchanged for a peel-away sheath. The catheter was placed through the peel-away sheath and the tip was positioned at the superior cavoatrial junction. Catheter placement was confirmed with fluoroscopy. The port was accessed and flushed with heparinized saline. The port pocket was closed using two layers of absorbable sutures and Dermabond. The vein skin site was closed using a single layer of absorbable suture and Dermabond. Sterile dressings were applied. Patient tolerated the procedure well without an immediate complication. Ultrasound and fluoroscopic images were taken and saved for this procedure.  Attention was directed to the peritoneal catheter placement. The right side of the abdomen was prepped and draped in sterile fashion. Ultrasound was used to confirm an adequate pocket in the right lateral abdomen. Skin was anesthetized with 1% lidocaine. Needle was directed into the peritoneal space with ultrasound guidance and clear yellow fluid was aspirated. A wire was advanced into the peritoneal cavity. The skin anterior to the peritoneal entry site was anesthetized with 1% lidocaine. Small incision was made. A subcutaneous tract was created between the 2 incisions. The PleurX peritoneal catheter was placed through the tunnel and the cuff was placed underneath the skin. A peel-away sheath was placed over the peritoneal wire and the PleurX catheter was placed  through the peel-away sheath. Catheter directed to the left side of the abdomen. Catheter was attached to suction canister and clear yellow fluid was easily draining. Peritoneal entry site was closed using absorbable suture and skin glue. Catheter was sutured to skin with Prolene suture. Dressing was placed over the incisions. Fluoroscopic and ultrasound images were taken and saved for documentation. IMPRESSION: Placement of a subcutaneous port device. Catheter tip in the superior cavoatrial junction. Successful placement of a tunneled peritoneal catheter with image guidance. Electronically Signed   By: Markus Daft M.D.   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 Patient needs a Port-A-Cath for chemotherapy and a tunneled peritoneal catheter for refractory symptomatic ascites. EXAM: FLUOROSCOPIC AND ULTRASOUND GUIDED PLACEMENT OF A SUBCUTANEOUS PORT IMAGE GUIDED PLACEMENT OF TUNNELED PERITONEAL CATHETER COMPARISON:  None. MEDICATIONS: Ancef 2 g; The antibiotic was administered within an appropriate time interval prior to skin  puncture. ANESTHESIA/SEDATION: Versed 2.5 mg IV; Fentanyl 125 mcg IV; Moderate Sedation Time:  65 minutes The patient was continuously monitored during the procedure by the interventional radiology nurse under my direct supervision. FLUOROSCOPY TIME:  36 seconds, 3 mGy COMPLICATIONS: None immediate. PROCEDURE: The procedure, risks, benefits, and alternatives were explained to the patient. Questions regarding the procedure were encouraged and answered. The patient understands and consents to the procedure. Patient was placed supine on the interventional table. Ultrasound confirmed a patent right internal jugular vein. The right chest and neck were cleaned with a skin antiseptic and a sterile drape was placed. Maximal barrier sterile technique was utilized including caps, mask, sterile gowns, sterile gloves, sterile drape, hand hygiene and skin antiseptic. The right neck was anesthetized with 1% lidocaine. Small incision was made in the right neck with a blade. Micropuncture set was placed in the right internal jugular vein with ultrasound guidance. The micropuncture wire was used for measurement purposes. The right chest was anesthetized with 1% lidocaine with epinephrine. #15 blade was used to make an incision and a subcutaneous port pocket was formed. [place] was assembled. Subcutaneous tunnel was formed with a stiff tunneling device. The port catheter was brought through the subcutaneous tunnel. The port was placed in the subcutaneous pocket. The micropuncture set was exchanged for a peel-away sheath. The catheter was placed through the peel-away sheath and the tip was positioned at the superior cavoatrial junction. Catheter placement was confirmed with fluoroscopy. The port was accessed and flushed with heparinized saline. The port pocket was closed using two layers of absorbable sutures and Dermabond. The vein skin site was closed using a single layer of absorbable suture and Dermabond. Sterile  dressings were applied. Patient tolerated the procedure well without an immediate complication. Ultrasound and fluoroscopic images were taken and saved for this procedure. Attention was directed to the peritoneal catheter placement. The right side of the abdomen was prepped and draped in sterile fashion. Ultrasound was used to confirm an adequate pocket in the right lateral abdomen. Skin was anesthetized with 1% lidocaine. Needle was directed into the peritoneal space with ultrasound guidance and clear yellow fluid was aspirated. A wire was advanced into the peritoneal cavity. The skin anterior to the peritoneal entry site was anesthetized with 1% lidocaine. Small incision was made. A subcutaneous tract was created between the 2 incisions. The PleurX peritoneal catheter was placed through the tunnel and the cuff was placed underneath the skin. A peel-away sheath was placed over  the peritoneal wire and the PleurX catheter was placed through the peel-away sheath. Catheter directed to the left side of the abdomen. Catheter was attached to suction canister and clear yellow fluid was easily draining. Peritoneal entry site was closed using absorbable suture and skin glue. Catheter was sutured to skin with Prolene suture. Dressing was placed over the incisions. Fluoroscopic and ultrasound images were taken and saved for documentation. IMPRESSION: Placement of a subcutaneous port device. Catheter tip in the superior cavoatrial junction. Successful placement of a tunneled peritoneal catheter with image guidance. Electronically Signed   By: Markus Daft M.D.   On: 03/06/2016 14:24   US Abdomen Limited Ruq  Result Date: 02/26/2016 CLINICAL DATA:  Acute onset of right upper quadrant abdominal pain. Initial encounter. EXAM: US ABDOMEN LIMITED - RIGHT UPPER QUADRANT COMPARISON:  CT of the abdomen and pelvis performed earlier today at 4:02 p.m. FINDINGS: Gallbladder: The gallbladder has a diffusely thick-walled appearance.  This is nonspecific in the presence of ascites. No stones are seen. No ultrasonographic Murphy's sign is elicited. Common bile duct: Diameter: 0.9 cm, raising question for distal obstruction. There is suggestion of mild debris within the mid common bile duct. Liver: No focal lesion identified. Nodular contour, compatible with hepatic cirrhosis. Mild prominence of the intrahepatic biliary ducts noted. Moderate volume ascites is seen within the abdomen. IMPRESSION: 1. Findings of hepatic cirrhosis, with moderate volume ascites in the abdomen. 2. Prominence of the common bile duct, measuring 0.9 cm, raising question for distal obstruction. Mild prominence of the intrahepatic biliary ducts. Suggestion of underlying debris within the mid common bile duct. 3. Diffusely thick-walled gallbladder appearance is nonspecific in the presence of ascites. Gallbladder otherwise unremarkable. Electronically Signed   By: Garald Balding M.D.   On: 02/26/2016 21:40     Microbiology: No results found for this or any previous visit (from the past 240 hour(s)).   Labs: Basic Metabolic Panel:  Last Labs    Recent Labs Lab 03/10/16 0626 03/11/16 0516 03/12/16 0607 03/13/16 0534 03/14/16 0518  NA 141 140 139 137 136  K 4.3 5.5* 4.8 4.4 4.3  CL 105 105 103 100* 97*  CO2 28 26 25 26 28   GLUCOSE 108* 127* 139* 142* 142*  BUN 43* 39* 44* 49* 57*  CREATININE 1.36* 1.25* 1.48* 1.65* 1.70*  CALCIUM 7.9* 7.9* 8.0* 7.9* 7.8*  MG  --  2.6* 2.5*  --   --   PHOS  --  4.5 4.5  --   --      CBC:  Last Labs    Recent Labs Lab 03/10/16 0626 03/11/16 0516 03/12/16 0607 03/13/16 0534  WBC 12.1* 11.2* 10.2 9.7  NEUTROABS 9.7* 9.2* 7.9* 7.7  HGB 13.7 13.4 13.1 13.3  HCT 41.6 40.1 39.4 39.1  MCV 100.7* 98.8 99.2 97.3  PLT 221 197 190 179     CBG:  Last Labs    Recent Labs Lab 03/14/16 2104 03/15/16 0733 03/15/16 1658 03/15/16 2208 03/16/16 0736  GLUCAP 114* 124* 123* 132* 121*       Signed:  Barton Dubois MD.  [facility] 03/16/2016, 11:37 AM

## 2016-03-16 NOTE — Care Management Note (Signed)
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 Pt has appt with Home Hospice RN at 1 pm. Scheduled dc home today with HH.   PCP [name] MD  Expected Discharge Date:  03/16/16               Expected Discharge Plan:  Home w Hospice Care  In-House Referral:  NA  Discharge planning Services  CM Consult  Post Acute Care Choice:  Hospice Choice offered to:  Spouse  DME Arranged:  N/A DME Agency:  NA  HH Arranged:  RN Blanca Agency:  [facility]  Status of Service:  Completed, signed off  If discussed at [place] of Stay Meetings, dates discussed:    Additional Comments:  Erenest Rasher, RN 03/16/2016, 11:41 AM

## 2016-03-16 NOTE — Progress Notes (Signed)
Physician Discharge Summary  Matthew Waller [id number] DOB: 02-14-1945 DOA: 03/06/2016  PCP: Matthew Low, MD  Admit date: 03/06/2016 Discharge date: 03/16/2016  Time spent: 35 minutes  Recommendations for Outpatient Follow-up:  1. Full comfort care 2. No further hospitalizations if possible 3. 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 Patient to be discharge home with hospice arrangements.   Diet recommendation: comfort feeding   Filed Weights   03/06/16 2206 03/15/16 1108  Weight: 104.3 kg (230 lb) 88.4 kg (194 lb 14.4 oz)    History of present illness:  As per H&P written by Dr. Hal Waller on 03/06/16 71 y.o. 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 Patient presented to the ER because of persistent nausea vomiting over the last 24 hours. Patient also has been having abdominal discomfort. Patient's last bowel movement was almost a week ago. CT scan of the abdomen shows features concerning for small bowel obstruction with transition point in the distal ileum with possible peritoneal carcinomatosis. On-call general surgeon Dr. Johney Waller was consulted. Patient is being admitted for small bowel obstruction with known history of recent diagnosis of cholangiocarcinoma.  Hospital Course:   SBO in setting of cholangiocarcinoma with peritoneal carcinomatosis, malignant ascites  -Peritoneal Pleurx catheter and right IJ port placed by IR as outpatient on1/31  -appreciate Oncology and CCS  Assistance/rec's -Appreciate palliative care inputs and rec's. After further [place] discussion, patient now DNR and looking to pursuit symptomatic management and comfort care. -PleurX accessed on 2/2 with 1L peritoneal fluid removed, and again done on 2/6 and 2/8 with anothet Liter removed each day. -planning drainage as needed base on patient symptoms and accumulation rate -Patient with ongoing nausea intermittently, but no further vomiting today, continue moving his bowels (loose) -will continue reglan TID -continue PRN antiemetics -Abd x-ray (2/8) still with persistent partial SBO features -IR consulted for venting gastric tube placement on 03/14/16 -after further discussion plan is for full comfort care and symptoms management  -hospice arranged for home services.  Hyperkalemia -No EKG without any acute ST-T changes -Resolved  -will no follow or recommend further labwork; plans is for comfort/symptomatic management   CKD stage 3 -Stable overall -patient not drinking a lot currently and losing fluids with paracentesis (intermittently) and now also with venting process (from gastrostomy tube). -Cr trended up some (last time checked) -now no further lab draws anticipated; focus on comfort/symptomatic management only.  Severe malnutrition in context of acute illness/injury  -After further GOC discussion and irreversibility of his condition; will pursuit comfort feeding and symptomatic management only. -hospice at home has been arranged  Procedures:  See below for x-ray reports   Gastrostomy venting tube placed on 03/14/16  Consultations:  CCS  Oncology  IR  Palliative Care  Discharge Exam: Vitals:   03/15/16 2035 03/16/16 0523  BP: 131/74 123/75  Pulse: 86 88  Resp: 18 18   Temp: 99 F (37.2 C) 98.8 F (37.1 C)   General exam: Afebrile, reports feeling weak, tired and listless. Patient reports feeling ok today; experiencing mild intermittent nausea. No further vomiting and no hiccups. Having some difficulty sleeping. Respiratory system: Clear to auscultation. Respiratory effort normal. Cardiovascular system: S1 & S2 heard, RRR. No JVD, murmurs, rubs, gallops or clicks. No pedal edema. Gastrointestinal system: Abdomen is a lot less distended and softer. Had 1L removed yesterday through PleurX in place, reduce Bowel sound (but present). Now also with gastrostomy venting tube in place. Central nervous system: Alert and oriented. No focal neurological deficits. Extremities: Symmetric 4-5 x 5 power due to poor effort and overall deconditioning from chronic illness. Skin: No rashes, lesions or ulcers appreciated Psychiatry: Judgement and insight appear normal. Mood & affect appropriate.    Discharge Instructions   Discharge Instructions    Discharge instructions    Complete by:  As directed    Full comfort care and symptomatic management Follow instructions and recommendations from hospice nurse Comfort feeding     Current Discharge Medication List    START taking these medications   Details  alum & mag hydroxide-simeth (MAALOX/MYLANTA) 200-200-20 MG/5ML suspension Take 30 mLs by mouth as needed for indigestion or heartburn. Qty: 355 mL, Refills: 0    feeding supplement, ENSURE ENLIVE, (ENSURE ENLIVE) LIQD Take 237 mLs by mouth 2 (two) times daily between meals. For comfort feeding    LORazepam (ATIVAN) 2 MG/ML concentrated solution Take 0.5 mLs (1 mg total) by mouth every 4 (four) hours as needed for anxiety or sleep (agitation). Qty: 45 mL, Refills: 0    metoCLOPramide (REGLAN) 5 MG/5ML solution Take 5 mLs (5 mg total) by mouth 3 (three) times daily before meals. Qty: 120 mL, Refills: 0    Morphine Sulfate (MORPHINE CONCENTRATE) 10 mg / 0.5 ml  concentrated solution Take 0.5 mLs (10 mg total) by mouth every 3 (three) hours as needed for moderate pain, severe pain or shortness of breath (comfort). Qty: 30 mL, Refills: 0      CONTINUE these medications which have CHANGED   Details  Lactulose 20 GM/30ML SOLN Take 30 mLs (20 g total) by mouth daily. Hold for severe diarrhea Qty: 473 mL, Refills: 1    zolpidem (AMBIEN) 5 MG tablet Take 1 tablet (5 mg total) by mouth at bedtime as needed for sleep. Qty: 20 tablet, Refills: 0      CONTINUE these medications which have NOT CHANGED   Details  !! Carboxymethylcell-Hypromellose (GENTEAL OP) Place 1 drop into both eyes daily.    !! Carboxymethylcell-Hypromellose (GENTEAL) 0.25-0.3 % GEL Place 1 drop into both eyes at bedtime.     !! - Potential duplicate medications found. Please discuss with provider.    STOP taking these medications     magnesium citrate SOLN      mirtazapine (REMERON) 15 MG tablet      oxyCODONE (ROXICODONE) 5 MG immediate release tablet      polyethylene glycol (MIRALAX / GLYCOLAX) packet      Wheat Dextrin (BENEFIBER DRINK MIX PO)      Ginger, Zingiber officinalis, (GINGER ROOT) 550 MG CAPS      GLUCOSAMINE-CHONDROITIN PO      loratadine (CLARITIN) 10 MG tablet      Omega-3 Fatty Acids (FISH OIL) 1000 MG CAPS      OVER THE COUNTER MEDICATION        No Known Allergies   The results  of significant diagnostics from this hospitalization (including imaging, microbiology, ancillary and laboratory) are listed below for reference.    Significant Diagnostic Studies: Ct Abdomen Pelvis W Contrast  Result Date: 03/06/2016 CLINICAL DATA:  Vomiting for 2 days.  Abdominal distention. EXAM: CT ABDOMEN AND PELVIS WITH CONTRAST TECHNIQUE: Multidetector CT imaging of the abdomen and pelvis was performed using the standard protocol following bolus administration of intravenous contrast. CONTRAST:  17mL ISOVUE-300 IOPAMIDOL (ISOVUE-300) INJECTION 61% COMPARISON:   03/01/2016, 02/26/2016 FINDINGS: Lower chest: Mild linear scarring or atelectasis in the bases. No lung base nodules or consolidation. Trace left pleural effusion. Hepatobiliary: Biliary stent is satisfactorily positioned. No hepatic parenchymal masses. Pneumobilia, related to the stent. Slight residual biliary prominence, but decreased from the pre-stent study. Pancreas: Unremarkable. No pancreatic ductal dilatation or surrounding inflammatory changes. Spleen: Normal in size without focal abnormality. Adrenals/Urinary Tract: Adrenal glands are unremarkable. Kidneys are normal, without renal calculi, focal lesion, or hydronephrosis. Bladder is unremarkable. Stomach/Bowel: Marked dilatation of stomach and small bowel, new from 02/26/2016. Transition to decompressed distal ileum occurs in the right lower quadrant, axial images 46- 53 series 2. The bowel appears somewhat matted together in this area, without definition of a discrete mass. There is diffuse peritoneal enhancement and this is increased from 02/26/2016. There is moderate volume peritoneal ascites. There is diffuse stranding infiltrative opacity in the omentum. These findings most likely represent peritoneal carcinomatosis. The increased degree of peritoneal enhancement and bowel wall enhancement is fairly striking compared to 02/26/2016, suggesting rapid disease progression. Less likely possibility of infectious peritonitis not entirely excluded. Percutaneous peritoneal drainage catheter is visible anteriorly, appearing satisfactorily positioned. Vascular/Lymphatic: Aortic atherosclerosis. No enlarged abdominal or pelvic lymph nodes. Reproductive: Unremarkable Other: No extraluminal air to suggest bowel perforation. Musculoskeletal: No significant skeletal lesions. IMPRESSION: 1. New marked dilatation of stomach and small bowel with transition to decompressed distal ileum in the right lower quadrant. Probable small bowel obstruction. A discrete mass is not  visible at the transition point, but the small bowel appears somewhat matted together. 2. 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 This is worsened from 02/26/2016, suggesting rapid progression of disease. Less likely possibility of infectious peritonitis cannot be excluded. 3. Biliary stent and peritoneal drainage catheter appear satisfactorily positioned. Electronically Signed   By: Andreas Newport M.D.   On: 03/06/2016 19:01   Ct Abdomen Pelvis W Contrast  Result Date: 02/26/2016 CLINICAL DATA:  Right-sided abdominal pain. EXAM: CT ABDOMEN AND PELVIS WITH CONTRAST TECHNIQUE: Multidetector CT imaging of the abdomen and pelvis was performed using the standard protocol following bolus administration of intravenous contrast. CONTRAST:  100 mL of Isovue-300 intravenously. COMPARISON:  CT scan of Jun 16, 2003. FINDINGS: Lower chest: Mild bilateral posterior basilar subsegmental atelectasis is noted. Hepatobiliary: No gallstones are noted. Mild intrahepatic and extrahepatic biliary dilatation is noted. No focal mass or lesion is noted in the liver. The liver does appear to be slightly moved smaller with slightly nodular contours suggesting hepatic cirrhosis. Pancreas: Unremarkable. No pancreatic ductal dilatation or surrounding inflammatory changes. Spleen: Normal in size without focal abnormality. Adrenals/Urinary Tract: Adrenal glands are unremarkable. Kidneys are normal, without renal calculi, focal lesion, or hydronephrosis. Bladder is unremarkable. Stomach/Bowel: There is no evidence of bowel obstruction. Vascular/Lymphatic: No significant vascular findings are present. No enlarged abdominal or pelvic lymph nodes. Reproductive: Mild prostatic enlargement is noted with associated calcification. Other: Moderate ascites is noted. Irregular thickening is seen involving the omentum ;  is uncertain if this represents  inflammation or edema, or possibly carcinomatosis. Small left fat containing inguinal hernia is noted. Musculoskeletal: No acute or significant osseous findings. IMPRESSION: Mild intrahepatic and extrahepatic biliary dilatation is noted. Correlation with liver function tests is recommended to rule out obstruction. Findings are suggestive of possible hepatic cirrhosis. Moderate ascites is noted. Irregular thickening is seen involving the omentum ; it is uncertain if this represents inflammation or edema, or possibly carcinomatosis. Clinical correlation is recommended. Electronically Signed   By: Marijo Conception, M.D.   On: 02/26/2016 16:33   Ir Gastrostomy Tube Mod Sed  Result Date: 03/14/2016 CLINICAL DATA:  stage IV metastatic cholangiocarcinoma to the peritoneum with malignant ascites. He was recently admitted with small bowel obstruction secondary to peritoneal carcinomatosis in addition to acute kidney injury, nausea, vomiting, weakness and malnutrition. He did not tolerate NG tube placement well. Request now received from surgery for palliative decompressive gastrostomy tube placement. Patient most likely has multiple partial obstructions to his small bowel from carcinomatosis EXAM: PERC PLACEMENT GASTROSTOMY FLUOROSCOPY TIME:  4 minutes (1051 uGym2 DAP) TECHNIQUE: The procedure, risks, benefits, and alternatives were explained to the patient and family. Questions regarding the procedure were encouraged and answered. The patient understands and consents to the procedure. As antibiotic prophylaxis, cefazolin 2 g was ordered pre-procedure and administered intravenously within one hour of incision. A safe percutaneous approach to the stomach was confirmed on recent CT abdomen. Preprocedure paracenteses performed through a previously placed tunneled catheter. Intravenous Fentanyl and Versed were administered as conscious sedation during continuous monitoring of the patient's level of consciousness and  physiological / cardiorespiratory status by the radiology RN, with a total moderate sedation time of 35 minutes. A 5 French angiographic catheter was placed as orogastric tube. The upper abdomen was prepped with Betadine, draped in usual sterile fashion, and infiltrated locally with 1% lidocaine. Stomach was insufflated using air through the orogastric tube. A large amount of residual fluid was noted distending the stomach, so the OG tube was exchanged over an Amplatz wire for a long 12 French pigtail catheter, advanced into the stomach to allow aspiration of 1.5 L of gastric contents. The catheter was then exchanged back for the 5 French angiographic catheter. Stomach was insufflated through the orogastric catheter. An 22 French sheath needle was advanced percutaneously into the gastric lumen under fluoroscopy. Gas could be aspirated and a small contrast injection confirmed intraluminal spread. The sheath was exchanged over a guidewire for a 9 [place] vascular sheath, through which the snare device was advanced and used to snare a guidewire passed through the orogastric tube. This was withdrawn, and the snare attached to the 20 French pull-through gastrostomy tube, which was advanced antegrade, positioned with the internal bumper securing the anterior gastric wall to the anterior abdominal wall. Small contrast injection confirms appropriate positioning. The external bumper was applied and the catheter was flushed. COMPLICATIONS: COMPLICATIONS none IMPRESSION: 1. Technically successful 20 French pull-through gastrostomy placement under fluoroscopy. Electronically Signed   By: Lucrezia Europe M.D.   On: 03/14/2016 13:55   US Paracentesis  Result Date: 02/29/2016 INDICATION: Ascites of unknown etiology. Request is made for therapeutic paracentesis. EXAM: ULTRASOUND GUIDED THERAPEUTIC PARACENTESIS MEDICATIONS: 1% lidocaine. COMPLICATIONS: None immediate. PROCEDURE: Informed written consent was obtained from the patient  after a discussion of the risks, benefits and alternatives to treatment. A timeout was performed prior to the initiation of the procedure. Initial ultrasound scanning demonstrates a small amount of ascites within the left upper  abdominal quadrant. The left upper abdomen was prepped and draped in the usual sterile fashion. 1% lidocaine was used for local anesthesia. Following this, a 19 gauge, 7-cm, Yueh catheter was introduced. An ultrasound image was saved for documentation purposes. The paracentesis was performed. The catheter was removed and a dressing was applied. The patient tolerated the procedure well without immediate post procedural complication. FINDINGS: A total of approximately 2.9 L of yellow fluid was removed. IMPRESSION: Successful ultrasound-guided paracentesis yielding 2.9 liters of peritoneal fluid. Read by: Saverio Danker, PA-C Electronically Signed   By: Aletta Edouard M.D.   On: 02/29/2016 13:54   US Paracentesis  Result Date: 02/27/2016 INDICATION: Should with right upper quadrant abdominal pain for 2 weeks. Recent imaging shows possible cirrhosis with moderate ascites. Request is made for diagnostic and therapeutic paracentesis. EXAM: ULTRASOUND GUIDED DIAGNOSTIC AND THERAPEUTIC PARACENTESIS MEDICATIONS: 1% lidocaine COMPLICATIONS: None immediate. PROCEDURE: Informed written consent was obtained from the patient after a discussion of the risks, benefits and alternatives to treatment. A timeout was performed prior to the initiation of the procedure. Initial ultrasound scanning demonstrates a small amount of ascites within the left lower abdominal quadrant. The left lower abdomen was prepped and draped in the usual sterile fashion. 1% lidocaine was used for local anesthesia. Following this, a 19 gauge, 7-cm, Yueh catheter was introduced. An ultrasound image was saved for documentation purposes. The paracentesis was performed. The catheter was removed and a dressing was applied. The patient  tolerated the procedure well without immediate post procedural complication. FINDINGS: A total of approximately 3.7 L of yellow fluid was removed. Samples were sent to the laboratory as requested by the clinical team. IMPRESSION: Successful ultrasound-guided paracentesis yielding 3.7 liters of peritoneal fluid. Read by: Saverio Danker, PA-C Electronically Signed   By: Jerilynn Mages.  Shick M.D.   On: 02/27/2016 12:08   Ir US Guide Vasc Access Right  Result Date: 03/06/2016 INDICATION: 71 year old with metastatic adenocarcinoma and malignant ascites. Patient needs a Port-A-Cath for chemotherapy and a tunneled peritoneal catheter for refractory symptomatic ascites. EXAM: FLUOROSCOPIC AND ULTRASOUND GUIDED PLACEMENT OF A SUBCUTANEOUS PORT IMAGE GUIDED PLACEMENT OF TUNNELED PERITONEAL CATHETER COMPARISON:  None. MEDICATIONS: Ancef 2 g; The antibiotic was administered within an appropriate time interval prior to skin puncture. ANESTHESIA/SEDATION: Versed 2.5 mg IV; Fentanyl 125 mcg IV; Moderate Sedation Time:  65 minutes The patient was continuously monitored during the procedure by the interventional radiology nurse under my direct supervision. FLUOROSCOPY TIME:  36 seconds, 3 mGy COMPLICATIONS: None immediate. PROCEDURE: The procedure, risks, benefits, and alternatives were explained to the patient. Questions regarding the procedure were encouraged and answered. The patient understands and consents to the procedure. Patient was placed supine on the interventional table. Ultrasound confirmed a patent right internal jugular vein. The right chest and neck were cleaned with a skin antiseptic and a sterile drape was placed. Maximal barrier sterile technique was utilized including caps, mask, sterile gowns, sterile gloves, sterile drape, hand hygiene and skin antiseptic. The right neck was anesthetized with 1% lidocaine. Small incision was made in the right neck with a blade. Micropuncture set was placed in the right internal jugular  vein with ultrasound guidance. The micropuncture wire was used for measurement purposes. The right chest was anesthetized with 1% lidocaine with epinephrine. #15 blade was used to make an incision and a subcutaneous port pocket was formed. [place] was assembled. Subcutaneous tunnel was formed with a stiff tunneling device. The port catheter was brought through the subcutaneous tunnel.  The port was placed in the subcutaneous pocket. The micropuncture set was exchanged for a peel-away sheath. The catheter was placed through the peel-away sheath and the tip was positioned at the superior cavoatrial junction. Catheter placement was confirmed with fluoroscopy. The port was accessed and flushed with heparinized saline. The port pocket was closed using two layers of absorbable sutures and Dermabond. The vein skin site was closed using a single layer of absorbable suture and Dermabond. Sterile dressings were applied. Patient tolerated the procedure well without an immediate complication. Ultrasound and fluoroscopic images were taken and saved for this procedure. Attention was directed to the peritoneal catheter placement. The right side of the abdomen was prepped and draped in sterile fashion. Ultrasound was used to confirm an adequate pocket in the right lateral abdomen. Skin was anesthetized with 1% lidocaine. Needle was directed into the peritoneal space with ultrasound guidance and clear yellow fluid was aspirated. A wire was advanced into the peritoneal cavity. The skin anterior to the peritoneal entry site was anesthetized with 1% lidocaine. Small incision was made. A subcutaneous tract was created between the 2 incisions. The PleurX peritoneal catheter was placed through the tunnel and the cuff was placed underneath the skin. A peel-away sheath was placed over the peritoneal wire and the PleurX catheter was placed through the peel-away sheath. Catheter directed to the left side of the abdomen. Catheter  was attached to suction canister and clear yellow fluid was easily draining. Peritoneal entry site was closed using absorbable suture and skin glue. Catheter was sutured to skin with Prolene suture. Dressing was placed over the incisions. Fluoroscopic and ultrasound images were taken and saved for documentation. IMPRESSION: Placement of a subcutaneous port device. Catheter tip in the superior cavoatrial junction. Successful placement of a tunneled peritoneal catheter with image guidance. Electronically Signed   By: Markus Daft M.D.   On: 03/06/2016 14:24   Ct Biopsy  Result Date: 03/01/2016 INDICATION: MALIGNANT ASCITES, PERITONEAL CARCINOMATOSIS BY CT, UNKNOWN PRIMARY EXAM: CT BIOPSY MEDICATIONS: 1% lidocaine locally ANESTHESIA/SEDATION: Moderate (conscious) sedation was employed during this procedure. A total of Versed 1.0 mg and Fentanyl 75 mcg was administered intravenously. Moderate Sedation Time: 6 minutes. The patient's level of consciousness and vital signs were monitored continuously by radiology nursing throughout the procedure under my direct supervision. FLUOROSCOPY TIME:  Fluoroscopy Time: None. COMPLICATIONS: None immediate. PROCEDURE: Informed written consent was obtained from the patient after a thorough discussion of the procedural risks, benefits and alternatives. All questions were addressed. Maximal Sterile Barrier Technique was utilized including caps, mask, sterile gowns, sterile gloves, sterile drape, hand hygiene and skin antiseptic. A timeout was performed prior to the initiation of the procedure. Previous imaging reviewed. Patient positioned supine. Noncontrast localization CT performed. The strandy fine nodularity in the omentum was localized in the left abdomen. Overlying skin was marked. Under sterile conditions and local anesthesia, a 17 gauge 6.8 cm access needle was advanced percutaneously from an anterior approach into the abnormal left anterior omentum. Needle position confirmed  with CT. 18 gauge core biopsies obtained. Samples placed in formalin. Needle removed. Postprocedure imaging demonstrates no hemorrhage or hematoma. Patient tolerated the biopsy well. IMPRESSION: Successful CT-guided left omental biopsy as above. Electronically Signed   By: Jerilynn Mages.  Shick M.D.   On: 03/01/2016 14:29   Dg Chest Port 1 View  Result Date: 02/26/2016 CLINICAL DATA:  Right middle flank pain. Abdominal distension with nausea, no vomiting. EXAM: PORTABLE CHEST 1 VIEW COMPARISON:  None. FINDINGS: 1514 hours. The  heart size and mediastinal contours are normal. There is mild left-greater-than-right basilar atelectasis. No edema, confluent airspace opacity or significant pleural effusion. The bones appear unremarkable. IMPRESSION: Mild bibasilar atelectasis.  No acute cardiopulmonary process. Electronically Signed   By: Richardean Sale M.D.   On: 02/26/2016 15:29   Dg Ercp Biliary & Pancreatic Ducts  Result Date: 02/28/2016 CLINICAL DATA:  Bile duct stone.  History of right abdominal pain. EXAM: ERCP TECHNIQUE: Multiple spot images obtained with the fluoroscopic device and submitted for interpretation post-procedure. FLUOROSCOPY TIME:  Fluoroscopy Time:  2 minutes and 42 seconds Number of Acquired Spot Images: 7 COMPARISON:  Abdominal CT 02/26/2016 FINDINGS: Cannulation and opacification of the common bile duct. Wire was advanced into the intrahepatic bile ducts. There is a focal narrowing or stricture in the proximal extrahepatic bile duct which is probably the common hepatic duct. Bile ducts are dilated proximal to this narrowing. A nonmetallic biliary stent was placed across the narrowing. IMPRESSION: Focal narrowing in the proximal extrahepatic biliary system, probably involving the common hepatic duct. Placement of biliary stent. These images were submitted for radiologic interpretation only. Please see the procedural report for the amount of contrast and the fluoroscopy time utilized. Electronically  Signed   By: Markus Daft M.D.   On: 02/28/2016 15:18   Dg Abd 2 Views  Result Date: 03/14/2016 CLINICAL DATA:  Follow-up small bowel obstruction. EXAM: ABDOMEN - 2 VIEW COMPARISON:  03/08/2016 FINDINGS: Tunneled ascites catheter with tip at the left mid abdomen and plastic biliary stent. Partly seen porta catheter on the right. Dilated loops of small bowel in the right lower quadrant and moderate distension of the stomach, similar to prior. Moderate volume of formed stool in the left colon. No rectal impaction. No evidence of pneumoperitoneum. IMPRESSION: Partial small bowel obstruction with distended stomach and right lower quadrant small bowel loops. Bowel gas pattern is similar to 6 days ago. Electronically Signed   By: Monte Fantasia M.D.   On: 03/14/2016 08:34   Dg Abd Portable 1v-small Bowel Obstruction Protocol-initial, 8 Hr Delay  Result Date: 03/08/2016 CLINICAL DATA:  Small bowel obstruction, 8 hour follow-up. EXAM: PORTABLE ABDOMEN - 1 VIEW COMPARISON:  CT abdomen and pelvis March 06, 2016 FINDINGS: Contrast within proximal small bowel which is dilated to 5 cm. Gas distended small bowel RIGHT lower quadrant measuring up to 4.5 cm. Surgical drain upper abdomen. Biliary stent. No intra-abdominal mass effect or pathologic calcifications. Soft tissue planes and included osseous structures are nonsuspicious. IMPRESSION: Small bowel obstruction, limited contrast transit to proximal small bowel dilated at 5 cm. Electronically Signed   By: Elon Alas M.D.   On: 03/08/2016 05:30   Dg Loyce Dys Tube Plc W/fl W/rad  Result Date: 03/08/2016 CLINICAL DATA:  NG tube placement EXAM: NASO G TUBE PLACEMENT WITH FL AND WITH RAD FLUOROSCOPY TIME:  Fluoroscopy Time:  2 minutes 2 seconds Radiation Exposure Index (if provided by the fluoroscopic device): 8.9 mGy Number of Acquired Spot Images: 0 COMPARISON:  None. FINDINGS: NG tube was placed under fluoroscopic guidance, with the tip and side-port in the mid  stomach. IMPRESSION: NG tube placed into the stomach. Electronically Signed   By: Rolm Baptise M.D.   On: 03/08/2016 15:23   Dg Addison Bailey G Tube Plc W/fl W/rad  Result Date: 03/07/2016 CLINICAL DATA:  Bowel obstruction with nausea. Therapeutic decompression. EXAM: NASO G TUBE PLACEMENT WITH FL AND WITH RAD FLUOROSCOPY TIME:  Fluoroscopy Time:  2 minutes 18 seconds Radiation Exposure Index (  if provided by the fluoroscopic device): 19 mGy Number of Acquired Spot Images: 0 COMPARISON:  None. FINDINGS: The nose was anesthetized with viscous lidocaine. A nasogastric tube was inserted through the right nostril and easily advanced to the GE junction. At this point, there was resistance to tube passage. Attempt was made to pass a soft tip wire through the tube, but the wire tip preferentially track through side-port. Ultimately, the patient was sat up, and the tube was easily advanced and positioned in the stomach. Copious succus was aspirated. No indication of complication. IMPRESSION: Successful nasogastric tube placement. Electronically Signed   By: Monte Fantasia M.D.   On: 03/07/2016 17:27   Mr Abdomen Mrcp Moise Boring Contast  Result Date: 02/29/2016 CLINICAL DATA:  Abdominal pain/ distension with abnormal liver enzymes, recent ERCP showing stricture in the common hepatic duct across which a stent was placed. Hepatic morphology raises suspicion for cirrhosis. Ascites with infiltrative pattern in the omentum. EXAM: MRI ABDOMEN WITHOUT AND WITH CONTRAST (INCLUDING MRCP) TECHNIQUE: Multiplanar multisequence MR imaging of the abdomen was performed both before and after the administration of intravenous contrast. Heavily T2-weighted images of the biliary and pancreatic ducts were obtained, and three-dimensional MRCP images were rendered by post processing. CONTRAST:  20 cc MultiHance COMPARISON:  02/26/2016 FINDINGS: Lower chest: Atelectasis in the left lower lobe with subsegmental atelectasis in the right lower lobe. Small  hiatal hernia. Hepatobiliary: Mildly nodular contour the liver with morphology suggesting cirrhosis. Contracted and somewhat thick-walled gallbladder. Periportal edema. The typical 3D time-of-flight MRCP sequence was nondiagnostic due to motion artifact despite numerous attempts. Based on the other diagnostic sequences, there is minimal intrahepatic biliary dilatation extending to a approximately 1.7 cm region of accentuated enhancement within or surrounding the common hepatic duct. There is believed to be a stent traversing this region of stricture although admittedly the stent is difficult to visualize on most sequences, but its presence is suggested on image 21 of series 4. On arterial phase images there is some faintly accentuated enhancement along the margins of the gallbladder fossa. Mild heterogeneity of enhancement in the caudate lobe but without a well-defined mass. My impression is that there is Waller-grade enhancement along the strictured segment of the common hepatic duct and extending into some of the intrahepatic bile ducts particularly in the left hepatic lobe and anteriorly in the right hepatic lobe, for example on images 54 through 71 of series 11404. For the most part this extends along the walls of the bile ducts rather than forming a single large mass. Mild enhancement of the distal CBD wall approaching the ampulla for example on image 94/11404. Pancreas: Mildly obscured by motion artifact. 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 No mass observed. Spleen:  Unremarkable Adrenals/Urinary Tract: Adrenal glands normal. Kidneys unremarkable. Stomach/Bowel: Unremarkable Vascular/Lymphatic: Patent celiac trunk, SMA, and single bilateral renal arteries. No pathologic adenopathy observed. Other: Upper abdominal ascites with irregular fluid infiltration of the omentum. This demonstrates abnormal reticular enhancement, and there is also thin enhancement along the peritoneal margins of the ascites. Musculoskeletal:  Unremarkable IMPRESSION: 1. Along the intrahepatic bile ducts and common hepatic duct, there is abnormal enhancement of the bile duct walls. There is also some CBD enhancement near the ampulla. The appearance is abnormal and could be a manifestation of cholangitis, but given the focal stricture on prior MRCP also raises concern for cholangiocarcinoma. This is not so much masslike as potentially infiltrative along the bile duct walls. Unfortunately, despite numerous attempts, the 3D time-of-flight MRCP images could not be  obtained for further spatial resolution assessment of the biliary tree. A stent is in place and there is only minimal intrahepatic biliary dilatation. 2. Ascites. Unusual nodular infiltration of the omentum with reticular associated enhancement, potentially from neoplastic involvement or inflammation of the omentum. There is subtle diffuse enhancement along the peritoneal margins of the ascites. 3. Small hiatal hernia. 4. Pancreas divisum. Electronically Signed   By: Van Clines M.D.   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 Patient needs a Port-A-Cath for chemotherapy and a tunneled peritoneal catheter for refractory symptomatic ascites. EXAM: FLUOROSCOPIC AND ULTRASOUND GUIDED PLACEMENT OF A SUBCUTANEOUS PORT IMAGE GUIDED PLACEMENT OF TUNNELED PERITONEAL CATHETER COMPARISON:  None. MEDICATIONS: Ancef 2 g; The antibiotic was administered within an appropriate time interval prior to skin puncture. ANESTHESIA/SEDATION: Versed 2.5 mg IV; Fentanyl 125 mcg IV; Moderate Sedation Time:  65 minutes The patient was continuously monitored during the procedure by the interventional radiology nurse under my direct supervision. FLUOROSCOPY TIME:  36 seconds, 3 mGy COMPLICATIONS: None immediate. PROCEDURE: The procedure, risks, benefits, and alternatives  were explained to the patient. Questions regarding the procedure were encouraged and answered. The patient understands and consents to the procedure. Patient was placed supine on the interventional table. Ultrasound confirmed a patent right internal jugular vein. The right chest and neck were cleaned with a skin antiseptic and a sterile drape was placed. Maximal barrier sterile technique was utilized including caps, mask, sterile gowns, sterile gloves, sterile drape, hand hygiene and skin antiseptic. The right neck was anesthetized with 1% lidocaine. Small incision was made in the right neck with a blade. Micropuncture set was placed in the right internal jugular vein with ultrasound guidance. The micropuncture wire was used for measurement purposes. The right chest was anesthetized with 1% lidocaine with epinephrine. #15 blade was used to make an incision and a subcutaneous port pocket was formed. Agency was assembled. Subcutaneous tunnel was formed with a stiff tunneling device. The port catheter was brought through the subcutaneous tunnel. The port was placed in the subcutaneous pocket. The micropuncture set was exchanged for a peel-away sheath. The catheter was placed through the peel-away sheath and the tip was positioned at the superior cavoatrial junction. Catheter placement was confirmed with fluoroscopy. The port was accessed and flushed with heparinized saline. The port pocket was closed using two layers of absorbable sutures and Dermabond. The vein skin site was closed using a single layer of absorbable suture and Dermabond. Sterile dressings were applied. Patient tolerated the procedure well without an immediate complication. Ultrasound and fluoroscopic images were taken and saved for this procedure. Attention was directed to the peritoneal catheter placement. The right side of the abdomen was prepped and draped in sterile fashion. Ultrasound was used to confirm an adequate pocket in the  right lateral abdomen. Skin was anesthetized with 1% lidocaine. Needle was directed into the peritoneal space with ultrasound guidance and clear yellow fluid was aspirated. A wire was advanced into the peritoneal cavity. The skin anterior to the peritoneal entry site was anesthetized with 1% lidocaine. Small incision was made. A subcutaneous tract was created between the 2 incisions. The PleurX peritoneal catheter was placed through the tunnel and the cuff was placed underneath the skin. A peel-away sheath was placed over the peritoneal wire and the PleurX catheter was placed through the peel-away sheath. Catheter directed to the left side  of the abdomen. Catheter was attached to suction canister and clear yellow fluid was easily draining. Peritoneal entry site was closed using absorbable suture and skin glue. Catheter was sutured to skin with Prolene suture. Dressing was placed over the incisions. Fluoroscopic and ultrasound images were taken and saved for documentation. IMPRESSION: Placement of a subcutaneous port device. Catheter tip in the superior cavoatrial junction. Successful placement of a tunneled peritoneal catheter with image guidance. Electronically Signed   By: Markus Daft M.D.   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 Patient needs a Port-A-Cath for chemotherapy and a tunneled peritoneal catheter for refractory symptomatic ascites. EXAM: FLUOROSCOPIC AND ULTRASOUND GUIDED PLACEMENT OF A SUBCUTANEOUS PORT IMAGE GUIDED PLACEMENT OF TUNNELED PERITONEAL CATHETER COMPARISON:  None. MEDICATIONS: Ancef 2 g; The antibiotic was administered within an appropriate time interval prior to skin puncture. ANESTHESIA/SEDATION: Versed 2.5 mg IV; Fentanyl 125 mcg IV; Moderate Sedation Time:  65 minutes The patient was continuously monitored during the procedure by the interventional radiology  nurse under my direct supervision. FLUOROSCOPY TIME:  36 seconds, 3 mGy COMPLICATIONS: None immediate. PROCEDURE: The procedure, risks, benefits, and alternatives were explained to the patient. Questions regarding the procedure were encouraged and answered. The patient understands and consents to the procedure. Patient was placed supine on the interventional table. Ultrasound confirmed a patent right internal jugular vein. The right chest and neck were cleaned with a skin antiseptic and a sterile drape was placed. Maximal barrier sterile technique was utilized including caps, mask, sterile gowns, sterile gloves, sterile drape, hand hygiene and skin antiseptic. The right neck was anesthetized with 1% lidocaine. Small incision was made in the right neck with a blade. Micropuncture set was placed in the right internal jugular vein with ultrasound guidance. The micropuncture wire was used for measurement purposes. The right chest was anesthetized with 1% lidocaine with epinephrine. #15 blade was used to make an incision and a subcutaneous port pocket was formed. Bailey was assembled. Subcutaneous tunnel was formed with a stiff tunneling device. The port catheter was brought through the subcutaneous tunnel. The port was placed in the subcutaneous pocket. The micropuncture set was exchanged for a peel-away sheath. The catheter was placed through the peel-away sheath and the tip was positioned at the superior cavoatrial junction. Catheter placement was confirmed with fluoroscopy. The port was accessed and flushed with heparinized saline. The port pocket was closed using two layers of absorbable sutures and Dermabond. The vein skin site was closed using a single layer of absorbable suture and Dermabond. Sterile dressings were applied. Patient tolerated the procedure well without an immediate complication. Ultrasound and fluoroscopic images were taken and saved for this procedure. Attention was directed to the  peritoneal catheter placement. The right side of the abdomen was prepped and draped in sterile fashion. Ultrasound was used to confirm an adequate pocket in the right lateral abdomen. Skin was anesthetized with 1% lidocaine. Needle was directed into the peritoneal space with ultrasound guidance and clear yellow fluid was aspirated. A wire was advanced into the peritoneal cavity. The skin anterior to the peritoneal entry site was anesthetized with 1% lidocaine. Small incision was made. A subcutaneous tract was created between the 2 incisions. The PleurX peritoneal catheter was placed through the tunnel and the cuff was placed underneath the skin. A peel-away sheath was placed over the peritoneal wire and the PleurX catheter was placed through  the peel-away sheath. Catheter directed to the left side of the abdomen. Catheter was attached to suction canister and clear yellow fluid was easily draining. Peritoneal entry site was closed using absorbable suture and skin glue. Catheter was sutured to skin with Prolene suture. Dressing was placed over the incisions. Fluoroscopic and ultrasound images were taken and saved for documentation. IMPRESSION: Placement of a subcutaneous port device. Catheter tip in the superior cavoatrial junction. Successful placement of a tunneled peritoneal catheter with image guidance. Electronically Signed   By: Markus Daft M.D.   On: 03/06/2016 14:24   US Abdomen Limited Ruq  Result Date: 02/26/2016 CLINICAL DATA:  Acute onset of right upper quadrant abdominal pain. Initial encounter. EXAM: US ABDOMEN LIMITED - RIGHT UPPER QUADRANT COMPARISON:  CT of the abdomen and pelvis performed earlier today at 4:02 p.m. FINDINGS: Gallbladder: The gallbladder has a diffusely thick-walled appearance. This is nonspecific in the presence of ascites. No stones are seen. No ultrasonographic Murphy's sign is elicited. Common bile duct: Diameter: 0.9 cm, raising question for distal obstruction. There is  suggestion of mild debris within the mid common bile duct. Liver: No focal lesion identified. Nodular contour, compatible with hepatic cirrhosis. Mild prominence of the intrahepatic biliary ducts noted. Moderate volume ascites is seen within the abdomen. IMPRESSION: 1. Findings of hepatic cirrhosis, with moderate volume ascites in the abdomen. 2. Prominence of the common bile duct, measuring 0.9 cm, raising question for distal obstruction. Mild prominence of the intrahepatic biliary ducts. Suggestion of underlying debris within the mid common bile duct. 3. Diffusely thick-walled gallbladder appearance is nonspecific in the presence of ascites. Gallbladder otherwise unremarkable. Electronically Signed   By: Garald Balding M.D.   On: 02/26/2016 21:40    Microbiology: No results found for this or any previous visit (from the past 240 hour(s)).   Labs: Basic Metabolic Panel:  Recent Labs Lab 03/10/16 0626 03/11/16 0516 03/12/16 0607 03/13/16 0534 03/14/16 0518  NA 141 140 139 137 136  K 4.3 5.5* 4.8 4.4 4.3  CL 105 105 103 100* 97*  CO2 28 26 25 26 28   GLUCOSE 108* 127* 139* 142* 142*  BUN 43* 39* 44* 49* 57*  CREATININE 1.36* 1.25* 1.48* 1.65* 1.70*  CALCIUM 7.9* 7.9* 8.0* 7.9* 7.8*  MG  --  2.6* 2.5*  --   --   PHOS  --  4.5 4.5  --   --    CBC:  Recent Labs Lab 03/10/16 0626 03/11/16 0516 03/12/16 0607 03/13/16 0534  WBC 12.1* 11.2* 10.2 9.7  NEUTROABS 9.7* 9.2* 7.9* 7.7  HGB 13.7 13.4 13.1 13.3  HCT 41.6 40.1 39.4 39.1  MCV 100.7* 98.8 99.2 97.3  PLT 221 197 190 179   CBG:  Recent Labs Lab 03/14/16 2104 03/15/16 0733 03/15/16 1658 03/15/16 2208 03/16/16 0736  GLUCAP 114* 124* 123* 132* 121*    Signed:  Barton Dubois MD.  [facility] 03/16/2016, 11:37 AM

## 2016-03-16 NOTE — Progress Notes (Signed)
Patient discharged home with Hospice.  Leaving with personal belongings and prescriptions.  Patient and wife report understanding of discharge instructions.  Drains intact.  Wife reports understanding of how to care for drains.  Room air, denies pain, no s/s of distress.  Accompanied by wife.  No complaints.

## 2016-03-16 NOTE — Progress Notes (Signed)
[place] with Helene Kelp, wife of patient, this morning and the equipment was delivered last night.    Thank you,  Edyth Gunnels, RN, [facility] Liaison [phone]   All hospital liaison's are now on [place].  Please feel free to contact me at the above number or call hospice at [phone] after 5 pm.

## 2016-03-21 ENCOUNTER — Telehealth: Payer: Self-pay | Admitting: Hematology

## 2016-04-04 NOTE — Telephone Encounter (Signed)
Horris Latino from Hospice called and said that Mrs Wyndham passed away 04/08/16 @2 :10 am @ home

## 2016-04-04 NOTE — Telephone Encounter (Signed)
CORRECTION:  Horris Latino from Hospice called to let us know that Mr Coho died 03-26-16  2:52am @home 

## 2016-04-04 DEATH — deceased

## 2017-09-04 IMAGING — XA IR PERC PLACEMENT GASTROSTOMY
1 series · 2 of 2 positions shown · non-contrast
Comparison: none

CLINICAL DATA: stage IV metastatic cholangiocarcinoma to the
peritoneum with malignant ascites. He was recently admitted with
small bowel obstruction secondary to peritoneal carcinomatosis in
addition to acute kidney injury, nausea, vomiting, weakness and
malnutrition. He did not tolerate NG tube placement well. Request
now received from surgery for palliative decompressive gastrostomy
tube placement. Patient most likely has multiple partial
obstructions to his small bowel from carcinomatosis

EXAM:
PERC PLACEMENT GASTROSTOMY
FLUOROSCOPY TIME:  4 minutes ([id number] DAP)
TECHNIQUE: The procedure, risks, benefits, and alternatives were explained to
the patient and family. Questions regarding the procedure were
encouraged and answered. The patient understands and consents to the
procedure. As antibiotic prophylaxis, cefazolin 2 g was ordered
pre-procedure and administered intravenously within one hour of
incision. A safe percutaneous approach to the stomach was confirmed
on recent CT abdomen. Preprocedure paracenteses performed through a
previously placed tunneled catheter. Intravenous Fentanyl and Versed
were administered as conscious sedation during continuous monitoring
of the patient's level of consciousness and physiological /
cardiorespiratory status by the radiology RN, with a total moderate
sedation time of 35 minutes. A 5 French angiographic catheter was
placed as orogastric tube. The upper abdomen was prepped with
Betadine, draped in usual sterile fashion, and infiltrated locally
with 1% lidocaine.

[Series 300: tube placements · 2 of 2 slices shown]
[im 1/2]
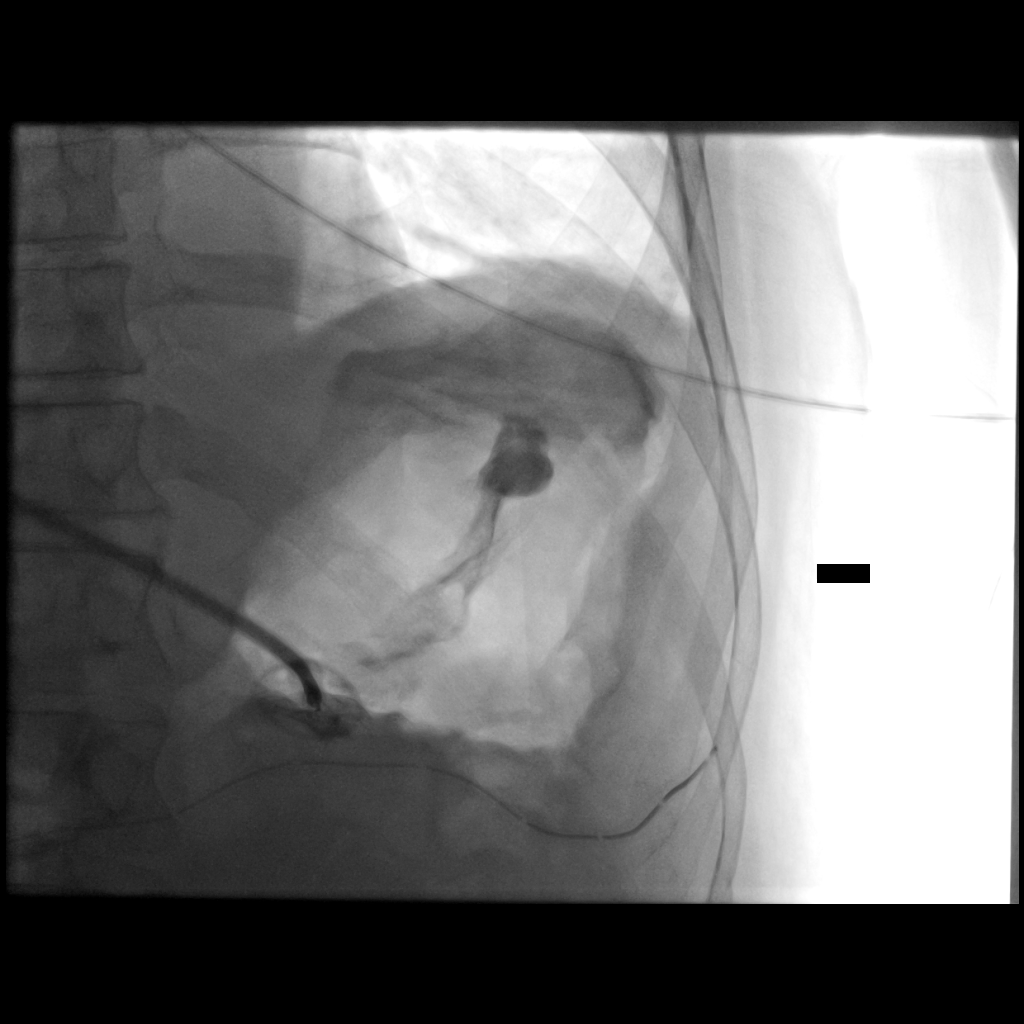
[im 2/2]
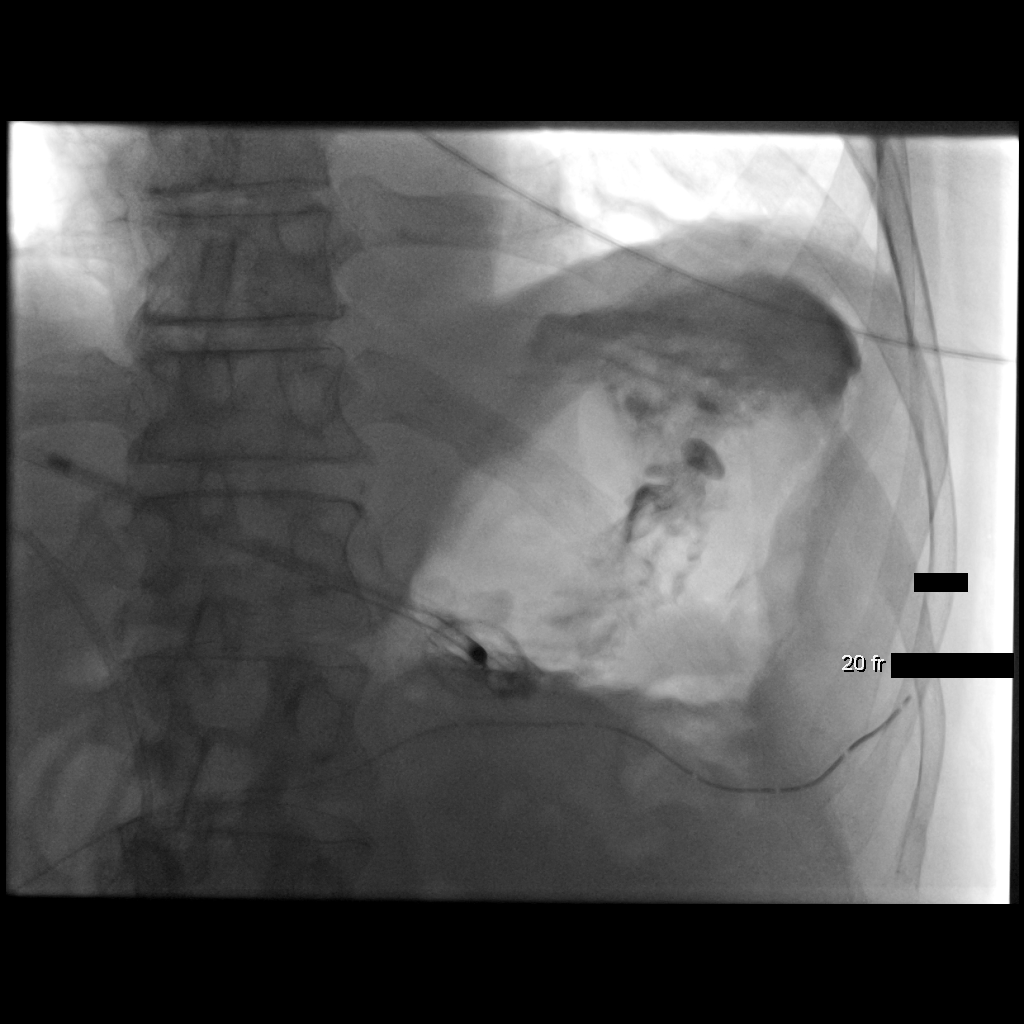

[2 of 2 positions shown; findings below may reference images not displayed]

Stomach was insufflated using air through the orogastric tube. A
large amount of residual fluid was noted distending the stomach, so
the OG tube was exchanged over an Amplatz wire for a long 12 French
pigtail catheter, advanced into the stomach to allow aspiration of
1.5 L of gastric contents. The catheter was then exchanged back for
the 5 French angiographic catheter. Stomach was insufflated through
the orogastric catheter. An 18 French sheath needle was advanced
percutaneously into the gastric lumen under fluoroscopy. Gas could
be aspirated and a small contrast injection confirmed intraluminal
spread. The sheath was exchanged over a guidewire for a 9 French
vascular sheath, through which the snare device was advanced and
used to snare a guidewire passed through the orogastric tube. This
was withdrawn, and the snare attached to the 20 French pull-through
gastrostomy tube, which was advanced antegrade, positioned with the
internal bumper securing the anterior gastric wall to the anterior
abdominal wall. Small contrast injection confirms appropriate
positioning. The external bumper was applied and the catheter was
flushed.

COMPLICATIONS:
COMPLICATIONS
none
IMPRESSION: 1. Technically successful 20 French pull-through gastrostomy
placement under fluoroscopy.
